# Patient Record
Sex: Female | Born: 1945 | Race: Black or African American | Hispanic: No | Smoking: Former smoker
Health system: Southern US, Community
[De-identification: ages and names within clinical notes are randomized; demographics above are authoritative.]

## PROBLEM LIST (undated history)

## (undated) DIAGNOSIS — C099 Malignant neoplasm of tonsil, unspecified: Secondary | ICD-10-CM

## (undated) DIAGNOSIS — K219 Gastro-esophageal reflux disease without esophagitis: Secondary | ICD-10-CM

## (undated) DIAGNOSIS — J449 Chronic obstructive pulmonary disease, unspecified: Secondary | ICD-10-CM

## (undated) DIAGNOSIS — M109 Gout, unspecified: Secondary | ICD-10-CM

## (undated) DIAGNOSIS — C189 Malignant neoplasm of colon, unspecified: Secondary | ICD-10-CM

## (undated) DIAGNOSIS — Z973 Presence of spectacles and contact lenses: Secondary | ICD-10-CM

## (undated) DIAGNOSIS — C801 Malignant (primary) neoplasm, unspecified: Secondary | ICD-10-CM

## (undated) DIAGNOSIS — H269 Unspecified cataract: Secondary | ICD-10-CM

## (undated) DIAGNOSIS — E7800 Pure hypercholesterolemia, unspecified: Secondary | ICD-10-CM

## (undated) DIAGNOSIS — I10 Essential (primary) hypertension: Secondary | ICD-10-CM

## (undated) DIAGNOSIS — R911 Solitary pulmonary nodule: Secondary | ICD-10-CM

## (undated) DIAGNOSIS — N289 Disorder of kidney and ureter, unspecified: Secondary | ICD-10-CM

## (undated) DIAGNOSIS — K115 Sialolithiasis: Secondary | ICD-10-CM

## (undated) DIAGNOSIS — Z923 Personal history of irradiation: Secondary | ICD-10-CM

## (undated) DIAGNOSIS — J189 Pneumonia, unspecified organism: Secondary | ICD-10-CM

## (undated) DIAGNOSIS — E119 Type 2 diabetes mellitus without complications: Secondary | ICD-10-CM

## (undated) HISTORY — DX: Malignant neoplasm of tonsil, unspecified: C09.9

## (undated) HISTORY — PX: CHOLECYSTECTOMY: SHX55

## (undated) HISTORY — DX: Sialolithiasis: K11.5

## (undated) HISTORY — PX: COLONOSCOPY: SHX174

## (undated) HISTORY — DX: Gastro-esophageal reflux disease without esophagitis: K21.9

## (undated) HISTORY — DX: Pure hypercholesterolemia, unspecified: E78.00

## (undated) HISTORY — DX: Malignant (primary) neoplasm, unspecified: C80.1

## (undated) HISTORY — PX: TONSILLECTOMY: SUR1361

## (undated) HISTORY — PX: BREAST BIOPSY: SHX20

## (undated) HISTORY — DX: Gout, unspecified: M10.9

## (undated) HISTORY — DX: Type 2 diabetes mellitus without complications: E11.9

## (undated) HISTORY — PX: WISDOM TOOTH EXTRACTION: SHX21

---

## 1997-12-18 ENCOUNTER — Ambulatory Visit (HOSPITAL_COMMUNITY): Admission: RE | Admit: 1997-12-18 | Discharge: 1997-12-18 | Payer: Self-pay | Admitting: Internal Medicine

## 2002-05-02 ENCOUNTER — Other Ambulatory Visit: Admission: RE | Admit: 2002-05-02 | Discharge: 2002-05-02 | Payer: Self-pay | Admitting: Obstetrics and Gynecology

## 2003-05-08 ENCOUNTER — Other Ambulatory Visit: Admission: RE | Admit: 2003-05-08 | Discharge: 2003-05-08 | Payer: Self-pay | Admitting: Obstetrics and Gynecology

## 2004-05-12 ENCOUNTER — Other Ambulatory Visit: Admission: RE | Admit: 2004-05-12 | Discharge: 2004-05-12 | Payer: Self-pay | Admitting: Obstetrics and Gynecology

## 2005-05-13 ENCOUNTER — Other Ambulatory Visit: Admission: RE | Admit: 2005-05-13 | Discharge: 2005-05-13 | Payer: Self-pay | Admitting: Obstetrics and Gynecology

## 2006-06-15 ENCOUNTER — Other Ambulatory Visit: Admission: RE | Admit: 2006-06-15 | Discharge: 2006-06-15 | Payer: Self-pay | Admitting: Obstetrics and Gynecology

## 2007-11-15 ENCOUNTER — Other Ambulatory Visit: Admission: RE | Admit: 2007-11-15 | Discharge: 2007-11-15 | Payer: Self-pay | Admitting: Obstetrics and Gynecology

## 2009-08-29 ENCOUNTER — Encounter: Admission: RE | Admit: 2009-08-29 | Discharge: 2009-08-29 | Payer: Self-pay | Admitting: Internal Medicine

## 2010-09-20 ENCOUNTER — Inpatient Hospital Stay (HOSPITAL_COMMUNITY)
Admission: EM | Admit: 2010-09-20 | Discharge: 2010-09-28 | DRG: 640 | Disposition: A | Discharge status: Home Health | Payer: 59 | Attending: Internal Medicine | Admitting: Internal Medicine

## 2010-09-20 ENCOUNTER — Emergency Department (HOSPITAL_COMMUNITY): Payer: 59

## 2010-09-20 DIAGNOSIS — R319 Hematuria, unspecified: Secondary | ICD-10-CM | POA: Diagnosis present

## 2010-09-20 DIAGNOSIS — F17200 Nicotine dependence, unspecified, uncomplicated: Secondary | ICD-10-CM | POA: Diagnosis present

## 2010-09-20 DIAGNOSIS — R7401 Elevation of levels of liver transaminase levels: Secondary | ICD-10-CM | POA: Diagnosis present

## 2010-09-20 DIAGNOSIS — E876 Hypokalemia: Secondary | ICD-10-CM | POA: Diagnosis present

## 2010-09-20 DIAGNOSIS — M25519 Pain in unspecified shoulder: Secondary | ICD-10-CM | POA: Diagnosis not present

## 2010-09-20 DIAGNOSIS — S42209A Unspecified fracture of upper end of unspecified humerus, initial encounter for closed fracture: Secondary | ICD-10-CM | POA: Diagnosis not present

## 2010-09-20 DIAGNOSIS — R7402 Elevation of levels of lactic acid dehydrogenase (LDH): Secondary | ICD-10-CM | POA: Diagnosis present

## 2010-09-20 DIAGNOSIS — D61818 Other pancytopenia: Secondary | ICD-10-CM | POA: Diagnosis present

## 2010-09-20 DIAGNOSIS — N179 Acute kidney failure, unspecified: Secondary | ICD-10-CM | POA: Diagnosis present

## 2010-09-20 DIAGNOSIS — G9341 Metabolic encephalopathy: Secondary | ICD-10-CM | POA: Diagnosis present

## 2010-09-20 DIAGNOSIS — W010XXA Fall on same level from slipping, tripping and stumbling without subsequent striking against object, initial encounter: Secondary | ICD-10-CM | POA: Diagnosis not present

## 2010-09-20 DIAGNOSIS — R197 Diarrhea, unspecified: Secondary | ICD-10-CM | POA: Diagnosis present

## 2010-09-20 DIAGNOSIS — E871 Hypo-osmolality and hyponatremia: Principal | ICD-10-CM | POA: Diagnosis present

## 2010-09-20 DIAGNOSIS — Y9210 Unspecified residential institution as the place of occurrence of the external cause: Secondary | ICD-10-CM | POA: Diagnosis not present

## 2010-09-20 DIAGNOSIS — I10 Essential (primary) hypertension: Secondary | ICD-10-CM | POA: Diagnosis present

## 2010-09-20 DIAGNOSIS — E869 Volume depletion, unspecified: Secondary | ICD-10-CM | POA: Diagnosis present

## 2010-09-20 DIAGNOSIS — F1010 Alcohol abuse, uncomplicated: Secondary | ICD-10-CM | POA: Diagnosis present

## 2010-09-20 DIAGNOSIS — R627 Adult failure to thrive: Secondary | ICD-10-CM | POA: Diagnosis present

## 2010-09-20 DIAGNOSIS — D638 Anemia in other chronic diseases classified elsewhere: Secondary | ICD-10-CM | POA: Diagnosis present

## 2010-09-20 LAB — URINALYSIS, ROUTINE W REFLEX MICROSCOPIC
Ketones, ur: NEGATIVE mg/dL
Leukocytes, UA: NEGATIVE
Urobilinogen, UA: 1 mg/dL (ref 0.0–1.0)

## 2010-09-20 LAB — URINE MICROSCOPIC-ADD ON

## 2010-09-20 LAB — OCCULT BLOOD, POC DEVICE: Fecal Occult Bld: NEGATIVE

## 2010-09-20 LAB — COMPREHENSIVE METABOLIC PANEL
AST: 86 U/L — ABNORMAL HIGH (ref 0–37)
Albumin: 3.8 g/dL (ref 3.5–5.2)
BUN: 35 mg/dL — ABNORMAL HIGH (ref 6–23)
Calcium: 6.6 mg/dL — ABNORMAL LOW (ref 8.4–10.5)
GFR calc Af Amer: 22 mL/min — ABNORMAL LOW (ref >60)
GFR calc non Af Amer: 18 mL/min — ABNORMAL LOW (ref >60)
Glucose, Bld: 83 mg/dL (ref 70–99)

## 2010-09-20 LAB — DIFFERENTIAL
Eosinophils Absolute: 0 10*3/uL (ref 0.0–0.7)
Lymphocytes Relative: 11 % — ABNORMAL LOW (ref 12–46)
Monocytes Absolute: 0.4 10*3/uL (ref 0.1–1.0)
Monocytes Relative: 8 % (ref 3–12)
Neutrophils Relative %: 81 % — ABNORMAL HIGH (ref 43–77)

## 2010-09-20 LAB — BASIC METABOLIC PANEL
CO2: 22 mEq/L (ref 19–32)
GFR calc Af Amer: 18 mL/min — ABNORMAL LOW (ref >60)
Glucose, Bld: 97 mg/dL (ref 70–99)
Potassium: 2.6 mEq/L — CL (ref 3.5–5.1)
Sodium: 113 mEq/L — CL (ref 135–145)

## 2010-09-20 LAB — RETICULOCYTES
Retic Count, Absolute: 6 10*3/uL — ABNORMAL LOW (ref 19.0–186.0)
Retic Ct Pct: 0.2 % — ABNORMAL LOW (ref 0.4–3.1)

## 2010-09-20 LAB — CBC
HCT: 26 % — ABNORMAL LOW (ref 36.0–46.0)
Hemoglobin: 9.9 g/dL — ABNORMAL LOW (ref 12.0–15.0)
MCHC: 38.1 g/dL — ABNORMAL HIGH (ref 30.0–36.0)
Platelets: 106 10*3/uL — ABNORMAL LOW (ref 150–400)

## 2010-09-20 LAB — NA AND K (SODIUM & POTASSIUM), RAND UR: Sodium, Ur: 32 mEq/L

## 2010-09-21 ENCOUNTER — Inpatient Hospital Stay (HOSPITAL_COMMUNITY): Payer: 59

## 2010-09-21 LAB — FOLATE: Folate: 11.9 ng/mL

## 2010-09-21 LAB — DIFFERENTIAL
Basophils Absolute: 0 10*3/uL (ref 0.0–0.1)
Eosinophils Relative: 1 % (ref 0–5)
Lymphocytes Relative: 15 % (ref 12–46)
Lymphs Abs: 0.6 10*3/uL — ABNORMAL LOW (ref 0.7–4.0)
Neutro Abs: 2.7 10*3/uL (ref 1.7–7.7)
Neutrophils Relative %: 72 % (ref 43–77)

## 2010-09-21 LAB — CLOSTRIDIUM DIFFICILE BY PCR: Toxigenic C. Difficile by PCR: NEGATIVE

## 2010-09-21 LAB — CBC
HCT: 22 % — ABNORMAL LOW (ref 36.0–46.0)
Hemoglobin: 8.3 g/dL — ABNORMAL LOW (ref 12.0–15.0)
MCHC: 37.7 g/dL — ABNORMAL HIGH (ref 30.0–36.0)
RBC: 2.6 MIL/uL — ABNORMAL LOW (ref 3.87–5.11)
WBC: 3.7 10*3/uL — ABNORMAL LOW (ref 4.0–10.5)

## 2010-09-21 LAB — MAGNESIUM
Magnesium: 1.1 mg/dL — ABNORMAL LOW (ref 1.5–2.5)
Magnesium: 1.5 mg/dL (ref 1.5–2.5)

## 2010-09-21 LAB — BASIC METABOLIC PANEL
Creatinine, Ser: 2.09 mg/dL — ABNORMAL HIGH (ref 0.4–1.2)
GFR calc non Af Amer: 24 mL/min — ABNORMAL LOW (ref >60)
Glucose, Bld: 119 mg/dL — ABNORMAL HIGH (ref 70–99)

## 2010-09-21 LAB — FERRITIN: Ferritin: 1492 ng/mL — ABNORMAL HIGH (ref 10–291)

## 2010-09-22 LAB — BASIC METABOLIC PANEL
BUN: 22 mg/dL (ref 6–23)
Calcium: 6.5 mg/dL — ABNORMAL LOW (ref 8.4–10.5)
Chloride: 91 mEq/L — ABNORMAL LOW (ref 96–112)
Creatinine, Ser: 1.06 mg/dL (ref 0.4–1.2)

## 2010-09-22 LAB — HEPATIC FUNCTION PANEL
ALT: 42 U/L — ABNORMAL HIGH (ref 0–35)
Alkaline Phosphatase: 95 U/L (ref 39–117)
Bilirubin, Direct: 0.5 mg/dL — ABNORMAL HIGH (ref 0.0–0.3)
Indirect Bilirubin: 0.7 mg/dL (ref 0.3–0.9)
Total Bilirubin: 1.2 mg/dL (ref 0.3–1.2)
Total Protein: 5.7 g/dL — ABNORMAL LOW (ref 6.0–8.3)

## 2010-09-22 LAB — MAGNESIUM: Magnesium: 1 mg/dL — ABNORMAL LOW (ref 1.5–2.5)

## 2010-09-22 LAB — CBC
HCT: 21.5 % — ABNORMAL LOW (ref 36.0–46.0)
MCHC: 37.2 g/dL — ABNORMAL HIGH (ref 30.0–36.0)
Platelets: 101 10*3/uL — ABNORMAL LOW (ref 150–400)
RDW: 13.6 % (ref 11.5–15.5)

## 2010-09-23 LAB — COMPREHENSIVE METABOLIC PANEL
AST: 113 U/L — ABNORMAL HIGH (ref 0–37)
Albumin: 3.3 g/dL — ABNORMAL LOW (ref 3.5–5.2)
Calcium: 6.4 mg/dL — CL (ref 8.4–10.5)
Creatinine, Ser: 0.76 mg/dL (ref 0.4–1.2)
GFR calc Af Amer: >60 mL/min (ref >60)
GFR calc non Af Amer: >60 mL/min (ref >60)

## 2010-09-23 LAB — CBC
MCH: 31.3 pg (ref 26.0–34.0)
MCHC: 35.9 g/dL (ref 30.0–36.0)
Platelets: 110 10*3/uL — ABNORMAL LOW (ref 150–400)

## 2010-09-23 LAB — BASIC METABOLIC PANEL
CO2: 22 mEq/L (ref 19–32)
Calcium: 6.6 mg/dL — ABNORMAL LOW (ref 8.4–10.5)
Creatinine, Ser: 0.78 mg/dL (ref 0.4–1.2)
GFR calc Af Amer: >60 mL/min (ref >60)
Glucose, Bld: 119 mg/dL — ABNORMAL HIGH (ref 70–99)

## 2010-09-23 LAB — PHOSPHORUS: Phosphorus: <0.3 mg/dL — CL (ref 2.3–4.6)

## 2010-09-24 LAB — PROTEIN ELECTROPHORESIS, SERUM
Albumin ELP: 60.8 % (ref 55.8–66.1)
Alpha-1-Globulin: 5.8 % — ABNORMAL HIGH (ref 2.9–4.9)
Alpha-2-Globulin: 11.3 % (ref 7.1–11.8)
Total Protein ELP: 6.4 g/dL (ref 6.0–8.3)

## 2010-09-24 LAB — CBC
Hemoglobin: 7.9 g/dL — ABNORMAL LOW (ref 12.0–15.0)
MCH: 32.2 pg (ref 26.0–34.0)
RBC: 2.45 MIL/uL — ABNORMAL LOW (ref 3.87–5.11)
WBC: 4.2 10*3/uL (ref 4.0–10.5)

## 2010-09-24 LAB — COMPREHENSIVE METABOLIC PANEL
Albumin: 3.4 g/dL — ABNORMAL LOW (ref 3.5–5.2)
BUN: 6 mg/dL (ref 6–23)
Chloride: 93 mEq/L — ABNORMAL LOW (ref 96–112)
Creatinine, Ser: 0.67 mg/dL (ref 0.4–1.2)
GFR calc non Af Amer: >60 mL/min (ref >60)
Glucose, Bld: 139 mg/dL — ABNORMAL HIGH (ref 70–99)
Total Bilirubin: 0.5 mg/dL (ref 0.3–1.2)

## 2010-09-24 LAB — ABO/RH: ABO/RH(D): O POS

## 2010-09-25 ENCOUNTER — Inpatient Hospital Stay (HOSPITAL_COMMUNITY): Payer: 59

## 2010-09-25 LAB — CBC
MCH: 31.5 pg (ref 26.0–34.0)
MCHC: 36.2 g/dL — ABNORMAL HIGH (ref 30.0–36.0)
MCV: 87.1 fL (ref 78.0–100.0)
Platelets: 143 10*3/uL — ABNORMAL LOW (ref 150–400)
RBC: 2.41 MIL/uL — ABNORMAL LOW (ref 3.87–5.11)
RDW: 13.7 % (ref 11.5–15.5)

## 2010-09-25 LAB — OSMOLALITY: Osmolality: 256 mOsm/kg — ABNORMAL LOW (ref 275–300)

## 2010-09-25 LAB — BASIC METABOLIC PANEL
BUN: 8 mg/dL (ref 6–23)
Calcium: 7.7 mg/dL — ABNORMAL LOW (ref 8.4–10.5)
Chloride: 91 mEq/L — ABNORMAL LOW (ref 96–112)
Creatinine, Ser: 0.79 mg/dL (ref 0.4–1.2)
GFR calc Af Amer: >60 mL/min (ref >60)
GFR calc non Af Amer: >60 mL/min (ref >60)

## 2010-09-25 LAB — PHOSPHORUS: Phosphorus: 0.7 mg/dL — CL (ref 2.3–4.6)

## 2010-09-25 LAB — MAGNESIUM: Magnesium: 1 mg/dL — ABNORMAL LOW (ref 1.5–2.5)

## 2010-09-26 LAB — COMPREHENSIVE METABOLIC PANEL
ALT: 29 U/L (ref 0–35)
AST: 40 U/L — ABNORMAL HIGH (ref 0–37)
Albumin: 3.4 g/dL — ABNORMAL LOW (ref 3.5–5.2)
CO2: 22 mEq/L (ref 19–32)
Calcium: 8.6 mg/dL (ref 8.4–10.5)
Chloride: 90 mEq/L — ABNORMAL LOW (ref 96–112)
Creatinine, Ser: 0.7 mg/dL (ref 0.4–1.2)
GFR calc Af Amer: >60 mL/min (ref >60)
GFR calc non Af Amer: >60 mL/min (ref >60)
Sodium: 124 mEq/L — ABNORMAL LOW (ref 135–145)

## 2010-09-26 LAB — UIFE/LIGHT CHAINS/TP QN, 24-HR UR
Alpha 1, Urine: DETECTED — AB
Alpha 2, Urine: DETECTED — AB
Free Kappa Lt Chains,Ur: 6.65 mg/dL — ABNORMAL HIGH (ref 0.04–1.51)
Free Lambda Excretion/Day: 28.93 mg/d
Time: 24 hours
Total Protein, Urine: 8.7 mg/dL
Volume, Urine: 2225 mL

## 2010-09-26 LAB — CBC
Hemoglobin: 7 g/dL — ABNORMAL LOW (ref 12.0–15.0)
MCH: 31.3 pg (ref 26.0–34.0)
MCHC: 36.6 g/dL — ABNORMAL HIGH (ref 30.0–36.0)
RBC: 2.24 MIL/uL — ABNORMAL LOW (ref 3.87–5.11)

## 2010-09-26 LAB — IRON AND TIBC
Iron: 27 ug/dL — ABNORMAL LOW (ref 42–135)
TIBC: 200 ug/dL — ABNORMAL LOW (ref 250–470)
UIBC: 173 ug/dL

## 2010-09-26 LAB — VITAMIN B12: Vitamin B-12: 257 pg/mL (ref 211–911)

## 2010-09-26 LAB — FERRITIN: Ferritin: 689 ng/mL — ABNORMAL HIGH (ref 10–291)

## 2010-09-26 NOTE — Group Therapy Note (Addendum)
Courtney Bowen, Courtney Bowen              ACCOUNT NO.:  [id number]  MEDICAL RECORD NO.:  [id number]           PATIENT TYPE:  I  LOCATION:  [id number]                         FACILITY:  [facility]  PHYSICIAN:  Lottie Dawson, MD       DATE OF BIRTH:  Oct 17, 1945                                PROGRESS NOTE   PRIMARY CARE PROVIDER: Delanna Ahmadi, MD  CURRENT DIAGNOSES: 1. Hyponatremia secondary to decreased volume aggravated by     hypokalemia. 2. Metabolic encephalopathy secondary to hyponatremia. 3. Hypokalemia. 4. Acute renal failure. 5. Diarrhea. 6. Elevated transaminase. 7. Hypocalcemia. 8. Hypomagnesemia. 9. Hypophosphatemia. 10.Vitamin D deficiency. 11.Pancytopenia. 12.Right humeral head and neck fracture, status post fall. 13.History of EtOH abuse.  MEDICATIONS: To be dictated at discharge.  DIAGNOSTIC LABORATORY DATA: Sodium 113, potassium 2.6, chloride 70, CO2 of 22, BUN 37, creatinine 3.17, glucose 97, calcium 6.6.  WBCs 4.4, hemoglobin 9.9, hematocrit 26.0, platelets 106, neutrophils 81%, absolute neutrophils 3.5, reticulocyte percent 0.2, RBCs 2.98, absolute reticulocytes 6.0.  PT 13.6, INR 1.02, PTT 34, magnesium 0.8.  Serum osmolality 251.  TSH 1.63. Vitamin B12 of 397.  Folic acid [id number].  Ferritin 14.92.  FOBT is negative.  Urine, 30 protein, 100 glucose, rare bacteria, granular casts.  C difficile by PCR is negative.  HIV antibody is nonreactive. Phosphorus is 0.3. Hepatic function panel, total bilirubin 1.2, direct bilirubin 0.5, indirect 0.7, alkaline phosphatase 95, AST 102, ALT 42, total protein 5.7, albumin 3.4.  Ionized calcium 0.88.  Parathyroid hormone 311.4, calcium 6.9.  DIAGNOSTIC RADIOLOGY: Chest x-ray on May 26th shows minimal changes of COPD, no acute abnormality.  Ultrasound of the abdomen on May 27th shows status post cholecystectomy. Common bile duct measures 9 mm.  X-ray of the right shoulder on May 31st shows a comminuted fracture through the  surgical neck of the right humerus with fracture line involving the lateral aspect of the right humeral head.  Underlying well circumcised lucency is suggested, a pathological fracture cannot be entirely excluded.  X-ray of her right humerus, comminuted mildly displaced fracture through the right humeral head and neck.  CONSULTATION: Pietro Cassis. Alvan Dame, MD  PROCEDURES: None at the time of this dictation.  BRIEF HISTORY: Courtney Bowen is a 65 year old female who presented to the [place] ED on May 26th with a 2-day history of worsening weakness, anorexia, and confusion.  She reported that for the past 2 weeks she had extremely poor appetite and has consumed no solid foods.  She denied any nausea, vomiting, or diarrhea.  However, when she got to the emergency room, she had frequent loose stools.  She also reported an approximate 20-pound weight loss over the past 2 or 3 months, most in the past 2 weeks.  She indicated her weakness progressed to the point she do not feel she could ambulate to the car to get to the emergency room, so she called EMS. Emergency room evaluation revealed severe hyponatremia with a serum sodium of 113, potassium was depressed at 2.6, BUN 37, creatinine 3.17 respectively.  The hospitalist was asked to admit for further evaluation and treatment.  HOSPITAL COURSE BY  PROBLEM: 1. Hyponatremia, probably secondary to volume depletion and alcohol     use.  The patient was given IV fluids and placed on fluid     restriction.  Her sodium level came up to 126 from 113.     Currently the patient has no IV fluids running and her sodium     continues to improve slowly.  2. Metabolic encephalopathy, probably secondary to #1, currently     resolved at the time of this dictation. 3. Hypokalemia.  Potassium has been repleted and hypokalemia is     resolved. 4. Acute renal failure, likely prerenal secondary to dehydration.  The     patient was given IV fluids, at the time  of this dictation acute     renal failure is resolved. 5. Elevated transaminase, probably secondary to EtOH use.  At the time     of this dictation, transaminase is improving.  AST currently 76,     ALT currently 41. 6. Hypocalcemia.  The patient has received vitamin D and calcium,     calcium level remains at 7.7 up from 6.4. 7. Hypomagnesemia.  Magnesium and has been repleted.  On two     occasions, magnesium remained low at 1.0 up from 0.8.     Likely due to malnutritions and alcohol use. 8. Hypophosphatemia has been repleted x2.  At the time of this     dictation, phosphorus is low at 0.7 up from less than 0.3. 9. Vitamin D deficiency.  The patient has been given vitamin D. 10.History of EtOH.  Upon admission and initial interview, the patient     admitted to a remote history of EtOH abuse.  In the course of the     week, the patient has admitted to being a current drinker and     having at least 4 glasses of rum per day.  The patient admits to     not eating.  She met with the social worker and verbalized an     interest in detoxing and alcohol anonymous.  The patient was     started on the CIWA protocol on May 30th as she was starting to     exhibit signs of withdrawal, specifically irritability, agitation,     some mild tremors. 11.Right humeral head fracture.  Last night, the patient got up to go     to the bathroom and tripped and fell, fracturing her right humerus     head.  Orthopedics has been asked to consult.  At the time of this     dictation, we are awaiting that consultation. 12.Physical exam is documented in chart on rounding note dated, Sep 25, 2010.  DISPOSITION: There is a question of when the patient is medically stable of her being discharged to home versus an inpatient detox.  Her sister from [place] is in town for a couple of weeks.  I updated her today.  We do have to wait and see what the treatment for her fracture will be and what that will entail.   Once that is resolved, we will have a better idea of whether the patient should be discharged to home or to [facility].  Either way the patient will need close followup with her primary care provider.   Addendum 09/26/2010: Patient was evaluated by Dr. Alvan Dame for right humeral head fracture. Recommended conservative management with sling and ice. He will re-evaluate the patient in 1-2 weeks in his clinic.  Patient's hemoglobin has been steadly declining and was given 1 unit of pRBC of 09/26/2010. Dr. Watt Climes from GI was consulted for anemia because though the patient's stool guiac was negative x2, patient's sister expressed concern that blood was found on cloths at home. Patient's urine IFE show no monoclonal free light chains (Bence Jones Protein) are detected. Urine IFE shows polyclonal increase in free Kappa and/or free Lambda light chains, significance is unclear.   Radene Gunning, NP   ______________________________ Lottie Dawson, MD    KMB/MEDQ  D:  09/25/2010  T:  09/26/2010  Job:  [id number]  Electronically Signed by Alveta Heimlich REDDY  on 09/26/2010 05:37:59 PM Electronically Signed by Dyanne Carrel  on 10/25/2010 11:01:38 AM

## 2010-09-27 LAB — CBC
MCH: 31.2 pg (ref 26.0–34.0)
MCHC: 36.8 g/dL — ABNORMAL HIGH (ref 30.0–36.0)
MCV: 84.7 fL (ref 78.0–100.0)
Platelets: 204 10*3/uL (ref 150–400)
RDW: 14.2 % (ref 11.5–15.5)

## 2010-09-27 LAB — BASIC METABOLIC PANEL
BUN: 9 mg/dL (ref 6–23)
Calcium: 9.1 mg/dL (ref 8.4–10.5)
Creatinine, Ser: 0.67 mg/dL (ref 0.4–1.2)
GFR calc Af Amer: >60 mL/min (ref >60)
GFR calc non Af Amer: >60 mL/min (ref >60)

## 2010-09-27 LAB — MAGNESIUM: Magnesium: 1.6 mg/dL (ref 1.5–2.5)

## 2010-09-27 LAB — PHOSPHORUS: Phosphorus: 3.2 mg/dL (ref 2.3–4.6)

## 2010-09-28 LAB — CBC
HCT: 17.8 % — ABNORMAL LOW (ref 36.0–46.0)
MCHC: 36.5 g/dL — ABNORMAL HIGH (ref 30.0–36.0)
Platelets: 185 10*3/uL (ref 150–400)
Platelets: 239 10*3/uL (ref 150–400)
RBC: 2.84 MIL/uL — ABNORMAL LOW (ref 3.87–5.11)
RDW: 14 % (ref 11.5–15.5)
RDW: 14.1 % (ref 11.5–15.5)
WBC: 6.6 10*3/uL (ref 4.0–10.5)

## 2010-09-28 LAB — TYPE AND SCREEN
ABO/RH(D): O POS
Antibody Screen: NEGATIVE
Unit division: 0

## 2010-09-28 LAB — BASIC METABOLIC PANEL
BUN: 12 mg/dL (ref 6–23)
Calcium: 9.1 mg/dL (ref 8.4–10.5)
Creatinine, Ser: 0.66 mg/dL (ref 0.4–1.2)
GFR calc non Af Amer: >60 mL/min (ref >60)
Glucose, Bld: 133 mg/dL — ABNORMAL HIGH (ref 70–99)
Potassium: 4.4 mEq/L (ref 3.5–5.1)

## 2010-09-28 NOTE — Consult Note (Signed)
Courtney Bowen, Courtney Bowen              ACCOUNT NO.:  [id number]  MEDICAL RECORD NO.:  [id number]           PATIENT TYPE:  I  LOCATION:  [id number]                         [facility]ACILITY:  [facility]  PHYSICIAN:  Pietro Cassis. Alvan Dame, M.D.  DATE OF BIRTH:  1945-07-30  DATE OF CONSULTATION:  09/25/2010 DATE OF DISCHARGE:                                CONSULTATION   CHIEF COMPLAINT:  Right shoulder injury.  HISTORY OF PRESENT ILLNESS:  Courtney Bowen is a 65 year old female admitted to the hospital on 09/20/2010 with a diagnoses of hyponatremia and anorexia.  Unfortunately, she was last night up in her rest room and she tripped, slipped, and fell injuring her right upper extremity.  Radiographs were ordered early in the a.m.  We were consulted in the morning with findings.  She has no other injuries to report.  No head injuries.  She has pain predominantly in the right shoulder.  She had been placed in a sling based on the recommendation made earlier.  She has some complaints about the sling as well as the fact she felt like her arm was flapping around slightly in the sling.  She had no lower extremity injuries to complain about over the left upper extremity.  PAST MEDICAL HISTORY:  Hypertension, reflux disease, history of tobacco abuse, remote total cystectomy.  FAMILY HISTORY:  Osteoarthritis, irritable bowel syndrome, and hypothyroidism.  Father with type 2 diabetes.  CURRENT MEDICATIONS:  Prilosec and Micardis.  ALLERGIES:  Drug allergies, none known.  REVIEW OF SYSTEMS:  At this time, a full review of system on admission is negative for new recent chills, fever, night sweats.  She has some progressive loss of weight recently, mostly likely due to anorexia.  No blurry vision or slurred speech.  No chest pain, cough or hemoptysis. No wheezing on exertion. No nausea, vomiting.  No frequency of urination.  PHYSICAL EXAMINATION:  GENERAL:  She is seen and evaluated in the hospital  bed. EXTREMITIES:  She had a sling in the right upper extremity.  She said the sling was too big, and felt that the arm was loose and then it was coming out and flapping at her side.  However, she is lying static in the bed at this point.  She is neurovascularly intact.  She has bruises and tenderness to palpation about the right shoulder.  RADIOLOGIC STUDIES:  Radiographs to the right upper extremity reveal a relatively nondisplaced right humeral head/neck fracture which is essentially 2-part fracture.  No evidence of any bowel injury.  ASSESSMENT: 1. Nondisplaced or minimally displaced proximal humerus fracture. 2. It can be treated at this point without an operation.     Recommend a sling and at this point a shoulder immobilizer to help     to try to provide some more support, however, when she is moving.  Otherwise ice, pain medications, and she should follow up with Dr. Paralee Cancel at [facility] at office number [phone] in 1 or 2 week period of time for radiographs or if there are any complicating features, questions, or concerns.     Pietro Cassis Alvan Dame, M.D.  MDO/MEDQ  D:  09/25/2010  T:  09/26/2010  Job:  [id number]  Electronically Signed by Paralee Cancel M.D. on 09/28/2010 04:27:55 PM

## 2010-10-01 NOTE — H&P (Signed)
Courtney Bowen, Courtney Bowen              ACCOUNT NO.:  [id number]  MEDICAL RECORD NO.:  [id number]           PATIENT TYPE:  I  LOCATION:  [id number]                         FACILITY:  [facility]  PHYSICIAN:  Marletta Lor, MDDATE OF BIRTH:  04-07-1946  DATE OF ADMISSION:  09/20/2010 DATE OF DISCHARGE:                             HISTORY & PHYSICAL   CHIEF COMPLAINT:  Anorexia and progressive weakness.  HISTORY OF PRESENT ILLNESS:  The patient is a 65 year old African- American female who presented to the ED with a 2-day history of worsening anorexia and progressive weakness.  She states that for the past 2 weeks she has had an extremely poor appetite but has been consuming liquids.  She denies any nausea, vomiting or diarrhea.  Since arrival in the ED, however, she has had frequent loose stools.  She feels there has been an approximate 20-pound weight loss over the past 2 or 3 months, most in the past 2 weeks.  Her weakness has progressed to the point where she did not feel she could ambulate to the car for transport to the hospital and came via EMS.  ED evaluation revealed severe hyponatremia with a serum sodium of 113, potassium was depressed at 2.6, BUN and creatinine 37 and 3.17 respectively.  The patient is also noted to have moderate pancytopenia.  The patient is now admitted for further evaluation and treatment of her hyponatremia as well as pancytopenia.  PAST MEDICAL HISTORY:  The patient has a long history of treated hypertension.  She has been on Micardis, unclear dose, but apparently not diuretic therapy.  The patient has gastroesophageal flux disease and a history of ongoing tobacco use.  She smokes in excess of 1 pack per day.  She states that she has not seen her primary care provider in a number of years.  She does state that she had a colonoscopy performed by Dr. Sammuel Cooper approximately 2 years ago before he retired.  She has had remote cholecystectomy, otherwise denies  any significant past medical history.  FAMILY HISTORY:  Mother is 66 with osteoarthritis, irritable bowel syndrome and a history of hypothyroidism.  Father died at 62.  One brother and one sister.  Brother has type 2 diabetes.  Sister with hypothyroidism.  Medical regimen includes Prilosec 20 mg daily, Micardis unclear dose.  DRUG ALLERGIES:  None.  REVIEW OF SYSTEMS:  Denies any fever, chills.  Positive for progressive weakness, anorexia, and a weight loss of 20 pounds.  HEENT: Negative for visual disturbances, hoarseness, swallowing difficulty.  PULMONARY: One- pack per day smoker.  Denies any known pulmonary disease.  Denies any productive cough or hemoptysis.  CARDIAC: Denies any melena, coronary artery disease, exertional chest pain.  GI:  Positive for anorexia and weight loss.  Positive diarrhea today.  She has a history of gastroesophageal flux disease and has been on chronic PPI therapy.  Did have a colonoscopy approximately 2 years ago.  ENDOCRINE:  Positive family history for diabetes and hypothyroidism.  No personal history. NEUROPSYCH:  Unremarkable.  SOCIAL HISTORY:  The patient is divorced.  No children.  One-pack per day smoker.  PHYSICAL EXAMINATION:  VITAL SIGNS:  Temperature 97.6, pulse 76, respiratory rate 15, blood pressure 100/68. GENERAL:  Exam revealed a well-developed, alert female who is quite weak but in no acute distress.  She did appear to be chronically ill with some right temporal wasting. HEENT EXAM:  Revealed normal pupil responses.  Conjunctivae clear.  Ear, nose and throat unremarkable.  Mucous membranes appeared well hydrated. NECK:  No bruits, adenopathy or thyroid enlargement. CHEST:  Clear. BREASTS:  Breast exam negative. CARDIAC EXAM:  Normal S1, S2.  No tachycardia.  No murmurs or gallops. ABDOMEN:  Soft and nontender.  There is suggestion of liver enlargement but no splenomegaly.  Bowel sounds were active.  EXTREMITIES:  Revealed no  edema.  Posterior tibial pulses were faint.  Dorsalis pedis pulses were full.  SKIN:  Warm and dry without rash.  There is some coolness to the skin peripherally involving the hands and feet.  Neurologic: Nonfocal.  She was weak but could move all extremities without difficulty.  Laboratory studies included a chest x-ray which revealed mild COPD but no active disease.  Urinalysis revealed specific gravity of 1.014 and mild hematuria.  Stool was negative for occult blood.  CBC revealed a white count 4.4 and total platelet count of 106,000, hemoglobin 9.9, hematocrit 26%, MCV was normal.  Chemistries revealed a serum sodium of 113, potassium 2.6, chloride 70, CO2 content 22, BUN 37, creatinine 3.17.  IMPRESSION: 1. Hyponatremia.  This is almost certain related to volume depletion,     aggravated by her hypokalemia.  She has been quite anorexic and     taking IV fluids and little solid foods.  Decrease in solid intake     also a major factor.  Her calculated serum osmolality is 244.  Will     continue volume replacement with IV normal saline and follow     electrolytes closely.  Will also replete potassium. 2. Pancytopenia.  We will check some anemia indices and follow up in     the morning. 3. Rule out hepatomegaly.  We will obtain abdominal ultrasound to     further evaluate and to exclude splenomegaly. 4. Additional diagnosis of hematuria.  Will follow up a urinalysis.     Marletta Lor, MD     PFK/MEDQ  D:  09/20/2010  T:  09/20/2010  Job:  [id number]  Electronically Signed by Bluford Kaufmann MD on 10/01/2010 09:39:36 PM

## 2010-10-02 NOTE — Discharge Summary (Addendum)
  NAMESIRI, Courtney Bowen              ACCOUNT NO.:  [id number]  MEDICAL RECORD NO.:  [id number]           PATIENT TYPE:  I  LOCATION:  [id number]                         FACILITY:  [facility]  PHYSICIAN:  Delanna Ahmadi, M.D.  DATE OF BIRTH:  1945/06/01  DATE OF ADMISSION:  09/20/2010 DATE OF DISCHARGE:                              DISCHARGE SUMMARY   ADDENDUM: On September 26, 2010, the patient was seen by Dr. Watt Climes of GI.  He felt the patient's anemia was likely a chronic disease.  The patient had been Hemoccult negative x3 and a GI workup was not recommended as an inpatient.  Consider EGD as an outpatient.  The patient did have a sodium 126 on September 27, 2010; was given one more day of normal saline with sodium coming up to 127.  Magnesium and potassium levels normalized. Ativan was discontinued.  The patient was seen by Physical Therapy and Occupational Therapy.  Physical Therapy felt the patient was ambulatory with a straight cane but continued to be unsteady.  Home physical therapy and straight cane was recommended.  Occupational Therapy also saw the patient and recommended home occupational therapy.  On the morning of possible discharge, the patient's hemoglobin was 6.5.  This was unexpected as the patient had had a hemoglobin of 10.2 documented on September 27, 2010, after blood transfusion.  The patient had shown no sign of GI bleeding such as melena or bright red blood per rectum.  A follow-up hemoglobin is pending at the time of this dictation.  Assuming that the initial hemoglobin was an incorrect reading and the patient's hemoglobin is closer to 9 or 10, she will be discharged.  DISCHARGE MEDICATIONS: 1. Folic acid 1 mg one p.o. daily for 2 weeks. 2. Magnesium oxide 400 mg 1 tablet b.i.d. for 2 weeks. 3. Multivitamin 1 p.o. daily. 4. Potassium chloride 20 mEq one a day for 2 weeks. 5. Thiamine 100 mg 1 a day for 2 weeks. 6. Os-Cal 500 mg one twice a day. 7. Vitamin D 1000 units once a  day. 8. Omeprazole 20 mg a day.  [name] Micardis HCT was discharged during the hospitalization.  DISPOSITION:  Discharged to home.  Follow up in 1 week with Dr. Laurann Montana, in 2 weeks with Dr. Alvan Dame of Orthopedics.  CODE STATUS:  Full code.  DIET:  Regular diet.  ACTIVITY:  As tolerated and per Physical Therapy and Occupational Therapy which was arranged at home.          ______________________________ Delanna Ahmadi, M.D.     JJG/MEDQ  D:  09/28/2010  T:  09/28/2010  Job:  [id number]  cc:   Pietro Cassis Alvan Dame, M.D. [id number]  Electronically Signed by Lavone Orn M.D. on 10/02/2010 06:13:27 PM Electronically Signed by Lavone Orn M.D. on 10/28/2010 05:57:23 PM

## 2010-11-08 NOTE — Consult Note (Signed)
Courtney Bowen, Courtney Bowen              ACCOUNT NO.:  [id number]  MEDICAL RECORD NO.:  [id number]           PATIENT TYPE:  I  LOCATION:  [id number]                         FACILITY:  [facility]  PHYSICIAN:  Jeryl Columbia, M.D.    DATE OF BIRTH:  1946/04/23  DATE OF CONSULTATION:  09/26/2010 DATE OF DISCHARGE:                                CONSULTATION   HISTORY:  The patient is seen at the request of the hospitalist for anemia.  Iron studies implied due to chronic disease.  She has had multiple guaiacs during this hospitalization, all have been negative. Currently, she is doing better from her hyponatremia and encephalopathy and alcohol withdrawal and she has no GI symptoms, has not seen any blood or black stools at home.  Her workup in the past has included a normal colonoscopy by Dr. Sammuel Cooper in 2008 and supposedly she had an endoscopy by Dr. Scarlette Shorts about 8 years ago.  Currently, I do not have that report.  PAST MEDICAL HISTORY:  Her past medical history is pertinent for high blood pressure, history of reflux, and the recent admission with acute renal failure, hyponatremia and humeral head and neck fracture status post fall as well as pancytopenia.  FAMILY HISTORY:  Pertinent for irritable bowel syndrome, but no significant GI problems in the family.  SOCIAL HISTORY:  Does drink as above and does smoke.  ALLERGIES:  None.  MEDICATIONS:  Medicines at home included Prilosec and probably Micardis.  Current in the hospital.  She is on Protonix, magnesium oxide, potassium, folic acid, vitamin D, calcium, thiamine, Ativan, Ensure, Ventolin, antacids, Senokot.  REVIEW OF SYSTEMS:  Negative except above and specifically no GI symptoms; currently and recently at home.  PHYSICAL EXAMINATION:  GENERAL:  No acute distress, sitting comfortably in the chair. ABDOMEN:  Soft, nontender.  LABORATORY DATA:  Pertinent for an anemia panel with low iron, low TIBC, low percent sat and high  ferritin.  CBC today includes hemoglobin of 7 with an MCV of 85, white count 6.3, platelet count has been on the low side 143 yesterday.  BUN of 10, creatinine 0.7.  Sodium still low at 127.  SGOT is 40, alk phos normal.  SGPT normal.  Albumin 3.4. Ultrasound of the abdomen shows her cholecystectomy, not mentioned above, CBD 9 mm but otherwise normal.  Liver is normal.  Spleen is 7.7 cm.  ASSESSMENT: 1. Multiple medical problems and issues. 2. Anemia probably of chronic disease.  PLAN:  With 3 guaiac negatives and no GI history, hold GI workup for now.  We will try to get Dr. Blanch Media endoscopy record from 8 or 9 years ago to bring her chart up-to-date; otherwise, have to see her back as an outpatient and decide if any further workup needs to be done like possibly an endoscopy first and certainly call us if she needs more help during this hospital stay.          ______________________________ Jeryl Columbia, M.D.     MEM/MEDQ  D:  09/26/2010  T:  09/26/2010  Job:  [id number]  cc:   Delanna Ahmadi, M.D. Fax: [phone]  Electronically Signed by Clarene Essex M.D. on 11/08/2010 03:00:40 PM

## 2010-11-28 ENCOUNTER — Ambulatory Visit (HOSPITAL_COMMUNITY): Payer: 59 | Attending: Internal Medicine

## 2010-12-03 ENCOUNTER — Encounter (HOSPITAL_COMMUNITY): Payer: 59 | Attending: Internal Medicine

## 2010-12-03 ENCOUNTER — Other Ambulatory Visit: Payer: Self-pay | Admitting: Internal Medicine

## 2010-12-03 DIAGNOSIS — N19 Unspecified kidney failure: Secondary | ICD-10-CM | POA: Insufficient documentation

## 2010-12-03 DIAGNOSIS — Z1389 Encounter for screening for other disorder: Secondary | ICD-10-CM | POA: Insufficient documentation

## 2010-12-03 DIAGNOSIS — E871 Hypo-osmolality and hyponatremia: Secondary | ICD-10-CM | POA: Insufficient documentation

## 2010-12-03 DIAGNOSIS — D638 Anemia in other chronic diseases classified elsewhere: Secondary | ICD-10-CM | POA: Insufficient documentation

## 2010-12-05 LAB — ACTH STIMULATION, 3 TIME POINTS
Cortisol, 30 Min: 20.7 ug/dL (ref >20)
Cortisol, 60 Min: 23.5 ug/dL (ref >20)
Cortisol, Base: 5.9 ug/dL

## 2011-01-21 ENCOUNTER — Other Ambulatory Visit: Payer: Self-pay | Admitting: Rheumatology

## 2011-01-21 ENCOUNTER — Ambulatory Visit
Admission: RE | Admit: 2011-01-21 | Discharge: 2011-01-21 | Disposition: A | Discharge status: Home / Self Care | Payer: 59 | Source: Ambulatory Visit | Attending: Rheumatology | Admitting: Rheumatology

## 2011-01-21 DIAGNOSIS — M1990 Unspecified osteoarthritis, unspecified site: Secondary | ICD-10-CM

## 2012-02-02 DIAGNOSIS — Z23 Encounter for immunization: Secondary | ICD-10-CM | POA: Diagnosis not present

## 2012-03-30 DIAGNOSIS — Z1231 Encounter for screening mammogram for malignant neoplasm of breast: Secondary | ICD-10-CM | POA: Diagnosis not present

## 2012-05-30 DIAGNOSIS — E7800 Pure hypercholesterolemia, unspecified: Secondary | ICD-10-CM | POA: Diagnosis not present

## 2012-05-30 DIAGNOSIS — Z79899 Other long term (current) drug therapy: Secondary | ICD-10-CM | POA: Diagnosis not present

## 2012-05-30 DIAGNOSIS — F17200 Nicotine dependence, unspecified, uncomplicated: Secondary | ICD-10-CM | POA: Diagnosis not present

## 2012-05-30 DIAGNOSIS — K219 Gastro-esophageal reflux disease without esophagitis: Secondary | ICD-10-CM | POA: Diagnosis not present

## 2012-05-30 DIAGNOSIS — E119 Type 2 diabetes mellitus without complications: Secondary | ICD-10-CM | POA: Diagnosis not present

## 2012-06-23 DIAGNOSIS — D3701 Neoplasm of uncertain behavior of lip: Secondary | ICD-10-CM | POA: Diagnosis not present

## 2012-06-23 DIAGNOSIS — C099 Malignant neoplasm of tonsil, unspecified: Secondary | ICD-10-CM | POA: Diagnosis not present

## 2012-06-23 DIAGNOSIS — K115 Sialolithiasis: Secondary | ICD-10-CM | POA: Diagnosis not present

## 2012-06-23 DIAGNOSIS — F17200 Nicotine dependence, unspecified, uncomplicated: Secondary | ICD-10-CM | POA: Diagnosis not present

## 2012-06-29 ENCOUNTER — Other Ambulatory Visit: Payer: Self-pay | Admitting: Otolaryngology

## 2012-06-29 DIAGNOSIS — C090 Malignant neoplasm of tonsillar fossa: Secondary | ICD-10-CM

## 2012-06-30 ENCOUNTER — Ambulatory Visit
Admission: RE | Admit: 2012-06-30 | Discharge: 2012-06-30 | Disposition: A | Discharge status: Home / Self Care | Payer: Medicare Other | Source: Ambulatory Visit | Attending: Otolaryngology | Admitting: Otolaryngology

## 2012-06-30 DIAGNOSIS — C090 Malignant neoplasm of tonsillar fossa: Secondary | ICD-10-CM

## 2012-06-30 DIAGNOSIS — C099 Malignant neoplasm of tonsil, unspecified: Secondary | ICD-10-CM | POA: Diagnosis not present

## 2012-06-30 MED ORDER — IOHEXOL 300 MG/ML  SOLN
75.0000 mL | Freq: Once | INTRAMUSCULAR | Status: AC | PRN
  Administered 2012-06-30: 75 mL via INTRAVENOUS

## 2012-07-05 DIAGNOSIS — C090 Malignant neoplasm of tonsillar fossa: Secondary | ICD-10-CM | POA: Diagnosis not present

## 2012-07-08 DIAGNOSIS — C099 Malignant neoplasm of tonsil, unspecified: Secondary | ICD-10-CM

## 2012-07-08 DIAGNOSIS — C0689 Malignant neoplasm of overlapping sites of other parts of mouth: Secondary | ICD-10-CM | POA: Diagnosis not present

## 2012-07-08 DIAGNOSIS — C801 Malignant (primary) neoplasm, unspecified: Secondary | ICD-10-CM | POA: Insufficient documentation

## 2012-07-08 HISTORY — DX: Malignant neoplasm of tonsil, unspecified: C09.9

## 2012-07-08 HISTORY — DX: Malignant (primary) neoplasm, unspecified: C80.1

## 2012-07-14 ENCOUNTER — Other Ambulatory Visit (HOSPITAL_COMMUNITY): Payer: Self-pay | Admitting: Otolaryngology

## 2012-07-14 DIAGNOSIS — C099 Malignant neoplasm of tonsil, unspecified: Secondary | ICD-10-CM

## 2012-07-20 ENCOUNTER — Encounter (HOSPITAL_COMMUNITY)
Admission: RE | Admit: 2012-07-20 | Discharge: 2012-07-20 | Disposition: A | Discharge status: Home / Self Care | Payer: Medicare Other | Source: Ambulatory Visit | Attending: Otolaryngology | Admitting: Otolaryngology

## 2012-07-20 DIAGNOSIS — R911 Solitary pulmonary nodule: Secondary | ICD-10-CM | POA: Insufficient documentation

## 2012-07-20 DIAGNOSIS — C099 Malignant neoplasm of tonsil, unspecified: Secondary | ICD-10-CM | POA: Diagnosis not present

## 2012-07-20 MED ORDER — FLUDEOXYGLUCOSE F - 18 (FDG) INJECTION
18.6000 | Freq: Once | INTRAVENOUS | Status: AC | PRN
  Administered 2012-07-20: 18.6 via INTRAVENOUS

## 2012-08-02 ENCOUNTER — Encounter: Payer: Self-pay | Admitting: *Deleted

## 2012-08-02 DIAGNOSIS — K115 Sialolithiasis: Secondary | ICD-10-CM | POA: Insufficient documentation

## 2012-08-02 DIAGNOSIS — C099 Malignant neoplasm of tonsil, unspecified: Secondary | ICD-10-CM | POA: Insufficient documentation

## 2012-08-03 ENCOUNTER — Ambulatory Visit
Admission: RE | Admit: 2012-08-03 | Discharge: 2012-08-03 | Disposition: A | Discharge status: Home / Self Care | Payer: Medicare Other | Source: Ambulatory Visit | Attending: Radiation Oncology | Admitting: Radiation Oncology

## 2012-08-03 ENCOUNTER — Encounter: Payer: Self-pay | Admitting: Radiation Oncology

## 2012-08-03 VITALS — BP 145/83 | HR 86 | Temp 98.7°F | Resp 20 | Ht 65.5 in | Wt 139.4 lb

## 2012-08-03 DIAGNOSIS — C099 Malignant neoplasm of tonsil, unspecified: Secondary | ICD-10-CM | POA: Insufficient documentation

## 2012-08-03 DIAGNOSIS — E7800 Pure hypercholesterolemia, unspecified: Secondary | ICD-10-CM | POA: Insufficient documentation

## 2012-08-03 DIAGNOSIS — E119 Type 2 diabetes mellitus without complications: Secondary | ICD-10-CM | POA: Insufficient documentation

## 2012-08-03 DIAGNOSIS — I10 Essential (primary) hypertension: Secondary | ICD-10-CM | POA: Insufficient documentation

## 2012-08-03 DIAGNOSIS — K219 Gastro-esophageal reflux disease without esophagitis: Secondary | ICD-10-CM | POA: Insufficient documentation

## 2012-08-03 DIAGNOSIS — N289 Disorder of kidney and ureter, unspecified: Secondary | ICD-10-CM | POA: Diagnosis not present

## 2012-08-03 DIAGNOSIS — Z79899 Other long term (current) drug therapy: Secondary | ICD-10-CM | POA: Diagnosis not present

## 2012-08-03 HISTORY — DX: Essential (primary) hypertension: I10

## 2012-08-03 HISTORY — DX: Disorder of kidney and ureter, unspecified: N28.9

## 2012-08-03 NOTE — Progress Notes (Addendum)
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 No   Social Work?   Dentistry?   Swallowing therapy?   Nutrition?   Med/Onc?   PEG placement?   SAFETY ISSUES:  Prior radiation? Possibly at age 67 for "rash" on her stomach  Pacemaker/ICD? no  Possible current pregnancy? no  Is the patient on methotrexate? 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  Pt requests to be seen by SW today if SW available. Spoke w/A Dalene Seltzer, SW who stated either she or L Mullis SW will see pt today.

## 2012-08-03 NOTE — Progress Notes (Signed)
Please see the Nurse Progress Note in the MD Initial Consult Encounter for this patient. 

## 2012-08-04 ENCOUNTER — Ambulatory Visit
Admission: RE | Admit: 2012-08-04 | Discharge: 2012-08-04 | Disposition: A | Discharge status: Home / Self Care | Payer: Medicare Other | Source: Ambulatory Visit | Attending: Radiation Oncology | Admitting: Radiation Oncology

## 2012-08-04 ENCOUNTER — Telehealth: Payer: Self-pay | Admitting: *Deleted

## 2012-08-04 ENCOUNTER — Other Ambulatory Visit: Payer: Self-pay | Admitting: Radiology

## 2012-08-04 DIAGNOSIS — C099 Malignant neoplasm of tonsil, unspecified: Secondary | ICD-10-CM | POA: Insufficient documentation

## 2012-08-04 NOTE — Progress Notes (Signed)
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  HISTORY OF PRESENT ILLNESS::Courtney Bowen is a 67 y.o. female  who presented about 2 months ago with symptoms of sore throat. She was seen by Dr. Redmond Baseman who saw a right tonsillar abnormality. He performed a biopsy which showed a invasive squamous cell carcinoma this was HPV P. 16 negative. She was referred to Dr. Nicolette Bang who ordered a CT of the neck which was performed on 06/30/2012. This showed asymmetry in the right oropharynx measuring up to 2 cm. A subcentimeter right level II AM. Lymph nodes the largest being 6 mm were noted. A 7 mm right apical lung nodule was also noted. A followup PET scan was performed on 326 which showed a tonsillar mass measuring 1.6 x 1.5 cm. A single hypermetabolic right level II lymph node was noted measuring 9 mm with an SUV of 3. This was concerning for metastatic versus reactive. Right upper lobe nodule was not hypermetabolic. The SUV of the primary lesion was 26. She discussed surgical options with Dr. Nicolette Bang and was sent to me to discuss definitive radiation as an alternative. She reports no difficulties in opening her mouth. She does have a history of a stone in her right submandibular gland. She has been reading a lot about preparing for treatment. She has seen her dentist and had a fall cleaning as well as fillings repaired. He did not recommend any teeth be pulled. She is also stocked up on liquid supplements. She is very determined to do this without any assistance as she lives alone and has no family nearby. She reports no real weight loss. 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    PAST SURGICAL HISTORY: Past Surgical History  Procedure Laterality Date  . Breast biopsy      hx of  . Cholecystectomy  20 yrs ago    laproscopic    FAMILY HISTORY: @[facility] @  SOCIAL HISTORY:  History  Substance Use Topics  . Smoking status: Former Smoker -- 1.50 packs/day for 45 years    Types: Cigarettes    Quit date: 07/07/2012  . Smokeless tobacco: Not on file     Comment: 1-1.5 ppd  . Alcohol Use: No     Comment: socially yrs ago    ALLERGIES: Review of patient's allergies indicates no known allergies.  MEDICATIONS:  Current Outpatient Prescriptions  Medication Sig Dispense Refill  . allopurinol (ZYLOPRIM) 100 MG tablet Take 100 mg by mouth daily.      Marland Kitchen atorvastatin (LIPITOR) 10 MG tablet Take 10 mg by mouth daily.      . calcium carbonate 200 MG capsule Take 250 mg by mouth 2 (two) times daily with a meal.      . cholecalciferol (VITAMIN D) 400 UNITS TABS Take by mouth.      . metFORMIN (GLUCOPHAGE) 1000 MG tablet Take 1,000 mg by mouth 2 (two) times daily with a meal.      . omeprazole (PRILOSEC) 10 MG  capsule Take 10 mg by mouth daily.      . polyethylene glycol (MIRALAX / GLYCOLAX) packet Take 17 g by mouth daily.       No current facility-administered medications for this encounter.    REVIEW OF SYSTEMS:  A 15 point review of systems is documented in the electronic medical record. This was obtained by the nursing staff. However, I reviewed this with the patient to discuss relevant findings and make appropriate changes.  Pertinent items are noted in HPI.   PHYSICAL EXAM:  Filed Vitals:   08/03/12 1429  BP: 145/83  Pulse: 86  Temp: 98.7 F (37.1 C)  Resp: 20  . She is a  pleasant female in no distress sitting comfortably examining table. She has no palpable right or left cervical or supraclavicular neck nodes. She is thin. Examination of the oral cavity and oropharynx reveals a large exophytic right tonsillar mass extending up towards the uvula and towards the midlineof  the tongue base without crossing over. There are no other abnormalities.  LABORATORY DATA:  Lab Results  Component Value Date   WBC 6.6 09/28/2010   HGB 8.8 RESULT REPEATED AND VERIFIED DELTA CHECK NOTED* 09/28/2010   HCT 24.1* 09/28/2010   MCV 84.9 09/28/2010   PLT 239 09/28/2010   Lab Results  Component Value Date   NA 127* 09/28/2010   K 4.4 09/28/2010   CL 94* 09/28/2010   CO2 22 09/28/2010   Lab Results  Component Value Date   ALT 29 09/26/2010   AST 40* 09/26/2010   ALKPHOS 117 09/26/2010   BILITOT 0.4 09/26/2010     RADIOGRAPHY: Nm Pet Image Initial (pi) Skull Base To Thigh  07/20/2012  *RADIOLOGY REPORT*  Clinical Data: Initial treatment strategy for carcinoma of the tonsil.  NUCLEAR MEDICINE PET SKULL BASE TO THIGH  Fasting Blood Glucose:  123  Technique:  18.6 mCi F-18 FDG was injected intravenously. CT data was obtained and used for attenuation correction and anatomic localization only.  (This was not acquired as a diagnostic CT examination.) Additional exam technical data entered on technologist worksheet.  Comparison:  Neck CT 06/1978 09/2012 the  Findings:  Neck: There is an ovoid right tonsillar mass measuring approximately 16 x 15 mm (image 24) with intense metabolic activity ( SUV max = 26.2.  There is a single mildly hypermetabolic  right level II location which measures 9 mm short axis (image 31) with SUV max = 3.2.  No additional hypermetabolic lymph nodes on the right.  No contralateral hypermetabolic nodes.  Symmetric activity is noted in the salivary glands.  Chest:  No hypermetabolic mediastinal or hilar nodes. There is a 7 mm right upper lobe pulmonary nodule (image 62) which does not have  associated metabolic activity.  Abdomen/Pelvis:  No abnormal hypermetabolic activity within the liver, pancreas, adrenal glands, or spleen.  No hypermetabolic lymph nodes in the abdomen or pelvis.  Skeleton:  No focal hypermetabolic activity to suggest skeletal metastasis.  IMPRESSION:  1.  Hypermetabolic right tonsillar lesion consistent with primary carcinoma. 2.  Mildly hypermetabolic right level II lymph node with differential including reactive node versus metastatic node. 3.  No evidence of contralateral hypermetabolic nodes. 4.  Right upper lobe pulmonary nodule without associated metabolic activity.  Recommend attention on follow-up.   Original Report Authenticated By: Suzy Bouchard, M.D.       IMPRESSION: T2 N1 (vs N0) HPV negative squamous cell carcinoma of the right tonsil  PLAN: I spoke with the patient  today. There is evidence that early stage tonsil cancers can be treated with radiation alone. A hypofractionated or dose escalated approach is recommended. We discussed the process of simulation the making a mask. We discussed acute effects of treatment including loss of taste, dry mouth skin darkness and sore throat. We discussd long-term side effects including permanent xerostomia and difficulty swallowing. At this point that lymph node is the only question that makes me wonder whether she would require her need chemotherapy. According to the NCCN guidelines if she is T2 N1 chemoradiation should be considered. I hesitate to escalate treatment however due to the significant increase in toxicity. I discussed this with her as well as Dr. Nicolette Bang. We agreed to attempt a biopsy of this lymph node. It was positive she would be referred for chemotherapy. If it was negative we would proceed on with radiation alone. I will also ask our dentist to obtain her outside films. She was confident that her dentist had performed a full evaluation. She is anxious to proceed forward with treatment. We will simulate  her later this week and plan on starting her treatment in 7-10 days. I also referred her to speech therapy as well as to our cancer center dietitian. We also had our Education officer, museum meet with her today. spent 60 minutes  face to face with the patient and more than 50% of that time was spent in counseling and/or coordination of care.   ------------------------------------------------  [name], MD

## 2012-08-04 NOTE — Telephone Encounter (Signed)
CALLED PATIENT TO INFORM OF APPTS., LVM FOR A RETURN CALL 

## 2012-08-04 NOTE — Telephone Encounter (Signed)
Called pt and informed her that she needs to have BUN, creatinine labs drawn before her ct sim on 08/05/12. Offered for pt to have labs drawn either today or tomorrow prior to her IV start at 1:15 pm. Pt decided to have labs drawn today at 3 pm. Dr Pablo Ledger notified, will drop orders into Epic. S Halsey scheduled pt's lab appt for 3 pm on 08/04/12. Pt verbalized agreement, understanding. C Huntley, RT in CT sim dept notified, verbalized understanding.

## 2012-08-04 NOTE — Addendum Note (Signed)
Encounter addended by: [name], MD on: 08/04/2012  9:51 AM<BR>     Documentation filed: Notes Section, Follow-up Section, LOS Section, Clinical Notes, Visit Diagnoses, Orders

## 2012-08-04 NOTE — Telephone Encounter (Signed)
Called patient to inform of test and nutrition appt. And speech therapy, lvm for a return call

## 2012-08-05 ENCOUNTER — Ambulatory Visit
Admission: RE | Admit: 2012-08-05 | Discharge: 2012-08-05 | Disposition: A | Discharge status: Home / Self Care | Payer: Medicare Other | Source: Ambulatory Visit | Attending: Radiation Oncology | Admitting: Radiation Oncology

## 2012-08-05 VITALS — BP 137/80 | HR 82 | Temp 97.7°F | Resp 16

## 2012-08-05 VITALS — Wt 139.5 lb

## 2012-08-05 DIAGNOSIS — K117 Disturbances of salivary secretion: Secondary | ICD-10-CM | POA: Insufficient documentation

## 2012-08-05 DIAGNOSIS — E119 Type 2 diabetes mellitus without complications: Secondary | ICD-10-CM | POA: Diagnosis not present

## 2012-08-05 DIAGNOSIS — R112 Nausea with vomiting, unspecified: Secondary | ICD-10-CM | POA: Insufficient documentation

## 2012-08-05 DIAGNOSIS — Y842 Radiological procedure and radiotherapy as the cause of abnormal reaction of the patient, or of later complication, without mention of misadventure at the time of the procedure: Secondary | ICD-10-CM | POA: Insufficient documentation

## 2012-08-05 DIAGNOSIS — K121 Other forms of stomatitis: Secondary | ICD-10-CM | POA: Insufficient documentation

## 2012-08-05 DIAGNOSIS — Z79899 Other long term (current) drug therapy: Secondary | ICD-10-CM | POA: Diagnosis not present

## 2012-08-05 DIAGNOSIS — L988 Other specified disorders of the skin and subcutaneous tissue: Secondary | ICD-10-CM | POA: Insufficient documentation

## 2012-08-05 DIAGNOSIS — C099 Malignant neoplasm of tonsil, unspecified: Secondary | ICD-10-CM | POA: Insufficient documentation

## 2012-08-05 DIAGNOSIS — Z510 Encounter for antineoplastic radiation therapy: Secondary | ICD-10-CM | POA: Insufficient documentation

## 2012-08-05 DIAGNOSIS — K209 Esophagitis, unspecified without bleeding: Secondary | ICD-10-CM | POA: Diagnosis not present

## 2012-08-05 DIAGNOSIS — K219 Gastro-esophageal reflux disease without esophagitis: Secondary | ICD-10-CM | POA: Diagnosis not present

## 2012-08-05 DIAGNOSIS — R070 Pain in throat: Secondary | ICD-10-CM | POA: Insufficient documentation

## 2012-08-05 DIAGNOSIS — K1230 Oral mucositis (ulcerative), unspecified: Secondary | ICD-10-CM | POA: Diagnosis not present

## 2012-08-05 MED ORDER — SODIUM CHLORIDE 0.9 % IJ SOLN
10.0000 mL | Freq: Once | INTRAMUSCULAR | Status: AC
  Administered 2012-08-05: 10 mL via INTRAVENOUS

## 2012-08-05 NOTE — Progress Notes (Signed)
Attempted two to start peripheral IV. Both unsuccessful. Amy, RN of medical oncology started left posterior forearm 22 gauge on the first attempt. Patient has very sclerotic veins. Patient tolerated well.

## 2012-08-05 NOTE — Progress Notes (Signed)
[facility] Simulation/Treatment Planning/IMRT note   LENNOX KANTOR  [id number] 08/05/2012  1946/02/17  Right tonsil cancer  CONSENT VERIFIED:yes   SET UP: Patient is set-up supine   IMMOBILIZATION: The following immobilization is used:Aquaplast Mask   NARRATIVE:The patient was brought to the [facility].  Identity was confirmed.  All relevant records and images related to the planned course of therapy were reviewed.  Then, the patient was positioned in a stable reproducible clinical set-up for radiation therapy using an aquaplast mask.  IV contrast was administered and CT images were obtained.  Skin markings were placed.  The CT images were loaded into the planning software where the target and avoidance structures were contoured.  [name] previous PET scan was fused with the planning CT to aid in target delineation. The radiation prescription was entered and confirmed.   TREATMENT PLANNING NOTE:  Treatment planning then occurred. I have requested : Intensity Modulated Radiotherapy (IMRT) is medically necessary for this case for the following reason:  Dose homogeneity and treatment of a head and neck site.

## 2012-08-08 ENCOUNTER — Ambulatory Visit
Admission: RE | Admit: 2012-08-08 | Discharge: 2012-08-08 | Disposition: A | Discharge status: Home / Self Care | Payer: Medicare Other | Source: Ambulatory Visit | Attending: Radiation Oncology | Admitting: Radiation Oncology

## 2012-08-08 ENCOUNTER — Other Ambulatory Visit: Payer: Self-pay | Admitting: Radiology

## 2012-08-08 ENCOUNTER — Other Ambulatory Visit: Payer: Self-pay | Admitting: Radiation Oncology

## 2012-08-08 DIAGNOSIS — C801 Malignant (primary) neoplasm, unspecified: Secondary | ICD-10-CM

## 2012-08-08 DIAGNOSIS — C099 Malignant neoplasm of tonsil, unspecified: Secondary | ICD-10-CM | POA: Insufficient documentation

## 2012-08-08 LAB — BUN AND CREATININE (CC13)
BUN: 7.5 mg/dL (ref 7.0–26.0)
Creatinine: 1 mg/dL (ref 0.6–1.1)

## 2012-08-08 NOTE — Addendum Note (Signed)
Encounter addended by: Andria Rhein, RN on: 08/08/2012  8:49 AM<BR>     Documentation filed: Charges VN

## 2012-08-09 ENCOUNTER — Ambulatory Visit (HOSPITAL_COMMUNITY)
Admission: RE | Admit: 2012-08-09 | Discharge: 2012-08-09 | Disposition: A | Discharge status: Home / Self Care | Payer: Medicare Other | Source: Ambulatory Visit | Attending: Radiation Oncology | Admitting: Radiation Oncology

## 2012-08-09 ENCOUNTER — Telehealth: Payer: Self-pay | Admitting: *Deleted

## 2012-08-09 DIAGNOSIS — D360 Benign neoplasm of lymph nodes: Secondary | ICD-10-CM | POA: Diagnosis not present

## 2012-08-09 DIAGNOSIS — C099 Malignant neoplasm of tonsil, unspecified: Secondary | ICD-10-CM

## 2012-08-09 DIAGNOSIS — R599 Enlarged lymph nodes, unspecified: Secondary | ICD-10-CM | POA: Diagnosis not present

## 2012-08-09 NOTE — Telephone Encounter (Signed)
Left vm informing pt 08/08/12 labs were normal, and she may resume her Metformin. Requested pt call back to verify she received and understood this message. Left name and call back number. Attempted to reach pt on mobile #, unable to leave vm.

## 2012-08-09 NOTE — Procedures (Signed)
[place] FNA R cervical LAN 0000000 [id number] x1 No complication No blood loss. See complete dictation in [facility].

## 2012-08-09 NOTE — Telephone Encounter (Signed)
Per [name], medical secretary, pt called and stated she got vm. Stated she understood that she can resume taking her Metformin.

## 2012-08-10 ENCOUNTER — Ambulatory Visit (HOSPITAL_COMMUNITY): Payer: Medicare Other

## 2012-08-11 ENCOUNTER — Ambulatory Visit: Payer: Medicare Other | Admitting: Nutrition

## 2012-08-11 ENCOUNTER — Telehealth: Payer: Self-pay | Admitting: *Deleted

## 2012-08-11 DIAGNOSIS — Z510 Encounter for antineoplastic radiation therapy: Secondary | ICD-10-CM | POA: Diagnosis not present

## 2012-08-11 DIAGNOSIS — C099 Malignant neoplasm of tonsil, unspecified: Secondary | ICD-10-CM | POA: Diagnosis not present

## 2012-08-11 DIAGNOSIS — K117 Disturbances of salivary secretion: Secondary | ICD-10-CM | POA: Diagnosis not present

## 2012-08-11 DIAGNOSIS — E119 Type 2 diabetes mellitus without complications: Secondary | ICD-10-CM | POA: Diagnosis not present

## 2012-08-11 DIAGNOSIS — R112 Nausea with vomiting, unspecified: Secondary | ICD-10-CM | POA: Diagnosis not present

## 2012-08-11 DIAGNOSIS — R070 Pain in throat: Secondary | ICD-10-CM | POA: Diagnosis not present

## 2012-08-11 NOTE — Telephone Encounter (Signed)
1:51 pm Spoke w/pt and informed her, per Dr Pablo Ledger, that she "has no cancer in the lymph node. No chemo." Pt verbalized thanks and understanding.

## 2012-08-11 NOTE — Progress Notes (Signed)
Patient is a 67 year old female diagnosed with tonsil cancer to receive radiation therapy. She is a patient of Dr. Pablo Ledger.  Past medical history includes diabetes, GERD, gout, hypercholesterolemia, renal insufficiency, and hypertension.  Medications include metformin, Lipitor, calcium carbonate, vitamin D, Glucophage, Prilosec, and MiraLax.  Labs include BUN of 7.5 and creatinine 1.0 on April 14.  Height: 65.5 inches. Weight: 139.5 pounds April 11.  BMI: 22.85.  Patient has done a lot of reading on side effects that she expects she will have during treatment for tonsil cancer. She states she will not need chemotherapy. She has questions regarding specific foods she should or should not eat during treatment.  Nutrition diagnosis: Food and nutrition related knowledge deficit related to new diagnosis of tonsil cancer and associated treatments as evidenced by no prior need for nutrition related information.  Intervention: I educated patient on strategies for increasing oral intake in small amounts of foods and liquids throughout the day. We've discussed the importance of high-calorie high-protein foods to promote weight maintenance. I've educated her on oral nutrition supplements and high-calorie snacks for her to consume. I've educated her on strategies for altering texture and temperature of food. I provided fact sheets and contact information.  Monitoring, evaluation, goals: Patient will tolerate adequate calories and protein to minimize weight loss throughout treatment.  Next visit: Wednesday, May 7.

## 2012-08-15 ENCOUNTER — Ambulatory Visit: Payer: Medicare Other

## 2012-08-15 ENCOUNTER — Ambulatory Visit: Payer: Medicare Other | Attending: Radiation Oncology

## 2012-08-15 DIAGNOSIS — C099 Malignant neoplasm of tonsil, unspecified: Secondary | ICD-10-CM | POA: Insufficient documentation

## 2012-08-15 DIAGNOSIS — IMO0001 Reserved for inherently not codable concepts without codable children: Secondary | ICD-10-CM | POA: Insufficient documentation

## 2012-08-15 DIAGNOSIS — K219 Gastro-esophageal reflux disease without esophagitis: Secondary | ICD-10-CM | POA: Diagnosis not present

## 2012-08-16 ENCOUNTER — Ambulatory Visit
Admission: RE | Admit: 2012-08-16 | Discharge: 2012-08-16 | Disposition: A | Discharge status: Home / Self Care | Payer: Medicare Other | Source: Ambulatory Visit | Attending: Radiation Oncology | Admitting: Radiation Oncology

## 2012-08-16 ENCOUNTER — Ambulatory Visit: Payer: Medicare Other

## 2012-08-16 ENCOUNTER — Ambulatory Visit: Payer: Medicare Other | Admitting: Radiation Oncology

## 2012-08-16 DIAGNOSIS — Z510 Encounter for antineoplastic radiation therapy: Secondary | ICD-10-CM | POA: Diagnosis not present

## 2012-08-16 DIAGNOSIS — C099 Malignant neoplasm of tonsil, unspecified: Secondary | ICD-10-CM

## 2012-08-16 DIAGNOSIS — R070 Pain in throat: Secondary | ICD-10-CM | POA: Diagnosis not present

## 2012-08-16 DIAGNOSIS — K117 Disturbances of salivary secretion: Secondary | ICD-10-CM | POA: Diagnosis not present

## 2012-08-16 DIAGNOSIS — E119 Type 2 diabetes mellitus without complications: Secondary | ICD-10-CM | POA: Diagnosis not present

## 2012-08-16 DIAGNOSIS — R112 Nausea with vomiting, unspecified: Secondary | ICD-10-CM | POA: Diagnosis not present

## 2012-08-16 MED ORDER — HYDROCODONE-ACETAMINOPHEN 7.5-325 MG/15ML PO SOLN: 15.0000 mL | Freq: Four times a day (QID) | ORAL | Status: DC | PRN

## 2012-08-16 NOTE — Progress Notes (Signed)
Weekly Management Note Current Dose:2.2 Gy  Projected Dose: 66 Gy   Narrative:  The patient presents for routine under treatment assessment.  CBCT/MVCT images/Port film x-rays were reviewed.  The chart was checked. Doing well. Has seen speech and dietician.  RN education today. Questions about teeth anc skin also if it was ok to chew nicotine gum. Still not smoking. More pain now at night. Referred pain to ear.   Physical Findings:  Unchanged  Vitals: There were no vitals filed for this visit. Weight:  Wt Readings from Last 3 Encounters:  08/05/12 139 lb 8 oz (63.277 kg)  08/03/12 139 lb 6.4 oz (63.231 kg)   Lab Results  Component Value Date   WBC 6.6 09/28/2010   HGB 8.8 RESULT REPEATED AND VERIFIED DELTA CHECK NOTED* 09/28/2010   HCT 24.1* 09/28/2010   MCV 84.9 09/28/2010   PLT 239 09/28/2010   Lab Results  Component Value Date   CREATININE 1.0 08/08/2012   BUN 7.5 08/08/2012   NA 127* 09/28/2010   K 4.4 09/28/2010   CL 94* 09/28/2010   CO2 22 09/28/2010     Impression:  The patient is tolerating radiation.  Plan:  Continue treatment as planned. Discussed no chemo and prior eval by her dentist is probably ok. Due to increasing pain, I believe we should start now and not wait for further tumor growth.

## 2012-08-16 NOTE — Progress Notes (Signed)
See by MD prior to assessment.

## 2012-08-17 ENCOUNTER — Ambulatory Visit: Payer: Medicare Other

## 2012-08-17 ENCOUNTER — Ambulatory Visit
Admission: RE | Admit: 2012-08-17 | Discharge: 2012-08-17 | Disposition: A | Discharge status: Home / Self Care | Payer: Medicare Other | Source: Ambulatory Visit | Attending: Radiation Oncology | Admitting: Radiation Oncology

## 2012-08-17 DIAGNOSIS — R112 Nausea with vomiting, unspecified: Secondary | ICD-10-CM | POA: Diagnosis not present

## 2012-08-17 DIAGNOSIS — E119 Type 2 diabetes mellitus without complications: Secondary | ICD-10-CM | POA: Diagnosis not present

## 2012-08-17 DIAGNOSIS — Z510 Encounter for antineoplastic radiation therapy: Secondary | ICD-10-CM | POA: Diagnosis not present

## 2012-08-17 DIAGNOSIS — R070 Pain in throat: Secondary | ICD-10-CM | POA: Diagnosis not present

## 2012-08-17 DIAGNOSIS — C099 Malignant neoplasm of tonsil, unspecified: Secondary | ICD-10-CM | POA: Diagnosis not present

## 2012-08-17 DIAGNOSIS — K117 Disturbances of salivary secretion: Secondary | ICD-10-CM | POA: Diagnosis not present

## 2012-08-18 ENCOUNTER — Ambulatory Visit
Admission: RE | Admit: 2012-08-18 | Discharge: 2012-08-18 | Disposition: A | Discharge status: Home / Self Care | Payer: Medicare Other | Source: Ambulatory Visit | Attending: Radiation Oncology | Admitting: Radiation Oncology

## 2012-08-18 ENCOUNTER — Ambulatory Visit: Payer: Medicare Other

## 2012-08-18 DIAGNOSIS — R112 Nausea with vomiting, unspecified: Secondary | ICD-10-CM | POA: Diagnosis not present

## 2012-08-18 DIAGNOSIS — C099 Malignant neoplasm of tonsil, unspecified: Secondary | ICD-10-CM | POA: Diagnosis not present

## 2012-08-18 DIAGNOSIS — R070 Pain in throat: Secondary | ICD-10-CM | POA: Diagnosis not present

## 2012-08-18 DIAGNOSIS — Z510 Encounter for antineoplastic radiation therapy: Secondary | ICD-10-CM | POA: Diagnosis not present

## 2012-08-18 DIAGNOSIS — E119 Type 2 diabetes mellitus without complications: Secondary | ICD-10-CM | POA: Diagnosis not present

## 2012-08-18 DIAGNOSIS — K117 Disturbances of salivary secretion: Secondary | ICD-10-CM | POA: Diagnosis not present

## 2012-08-19 ENCOUNTER — Ambulatory Visit: Payer: Medicare Other

## 2012-08-19 ENCOUNTER — Ambulatory Visit
Admission: RE | Admit: 2012-08-19 | Discharge: 2012-08-19 | Disposition: A | Discharge status: Home / Self Care | Payer: Medicare Other | Source: Ambulatory Visit | Attending: Radiation Oncology | Admitting: Radiation Oncology

## 2012-08-19 DIAGNOSIS — R112 Nausea with vomiting, unspecified: Secondary | ICD-10-CM | POA: Diagnosis not present

## 2012-08-19 DIAGNOSIS — R070 Pain in throat: Secondary | ICD-10-CM | POA: Diagnosis not present

## 2012-08-19 DIAGNOSIS — K117 Disturbances of salivary secretion: Secondary | ICD-10-CM | POA: Diagnosis not present

## 2012-08-19 DIAGNOSIS — Z510 Encounter for antineoplastic radiation therapy: Secondary | ICD-10-CM | POA: Diagnosis not present

## 2012-08-19 DIAGNOSIS — C099 Malignant neoplasm of tonsil, unspecified: Secondary | ICD-10-CM | POA: Diagnosis not present

## 2012-08-19 DIAGNOSIS — E119 Type 2 diabetes mellitus without complications: Secondary | ICD-10-CM | POA: Diagnosis not present

## 2012-08-19 NOTE — Progress Notes (Signed)
Late entry: Education regarding management of radiation therapy side-effects reviewed on 08/16/12 in regards to pain, dysphagia, odonyphagia, skin care, mouth care and need for optimal protein/calorie intake.  Patient was given the Radiation Therapy and you booklet and education was reiteration of information patient had already received and information she obtained prior to her 1st treatment.  Very engaged and committed to her personal care.

## 2012-08-22 ENCOUNTER — Ambulatory Visit
Admission: RE | Admit: 2012-08-22 | Discharge: 2012-08-22 | Disposition: A | Discharge status: Home / Self Care | Payer: Medicare Other | Source: Ambulatory Visit | Attending: Radiation Oncology | Admitting: Radiation Oncology

## 2012-08-22 ENCOUNTER — Ambulatory Visit: Payer: Medicare Other

## 2012-08-22 DIAGNOSIS — C099 Malignant neoplasm of tonsil, unspecified: Secondary | ICD-10-CM | POA: Diagnosis not present

## 2012-08-22 DIAGNOSIS — E119 Type 2 diabetes mellitus without complications: Secondary | ICD-10-CM | POA: Diagnosis not present

## 2012-08-22 DIAGNOSIS — R070 Pain in throat: Secondary | ICD-10-CM | POA: Diagnosis not present

## 2012-08-22 DIAGNOSIS — K117 Disturbances of salivary secretion: Secondary | ICD-10-CM | POA: Diagnosis not present

## 2012-08-22 DIAGNOSIS — Z510 Encounter for antineoplastic radiation therapy: Secondary | ICD-10-CM | POA: Diagnosis not present

## 2012-08-22 DIAGNOSIS — R112 Nausea with vomiting, unspecified: Secondary | ICD-10-CM | POA: Diagnosis not present

## 2012-08-23 ENCOUNTER — Ambulatory Visit
Admission: RE | Admit: 2012-08-23 | Discharge: 2012-08-23 | Disposition: A | Discharge status: Home / Self Care | Payer: Medicare Other | Source: Ambulatory Visit | Attending: Radiation Oncology | Admitting: Radiation Oncology

## 2012-08-23 ENCOUNTER — Ambulatory Visit: Payer: Medicare Other

## 2012-08-23 ENCOUNTER — Encounter: Payer: Self-pay | Admitting: Radiation Oncology

## 2012-08-23 VITALS — BP 144/77 | HR 76 | Temp 97.8°F | Resp 20 | Wt 138.6 lb

## 2012-08-23 DIAGNOSIS — E119 Type 2 diabetes mellitus without complications: Secondary | ICD-10-CM | POA: Diagnosis not present

## 2012-08-23 DIAGNOSIS — Z510 Encounter for antineoplastic radiation therapy: Secondary | ICD-10-CM | POA: Diagnosis not present

## 2012-08-23 DIAGNOSIS — C099 Malignant neoplasm of tonsil, unspecified: Secondary | ICD-10-CM

## 2012-08-23 DIAGNOSIS — K117 Disturbances of salivary secretion: Secondary | ICD-10-CM | POA: Diagnosis not present

## 2012-08-23 DIAGNOSIS — R070 Pain in throat: Secondary | ICD-10-CM | POA: Diagnosis not present

## 2012-08-23 DIAGNOSIS — R112 Nausea with vomiting, unspecified: Secondary | ICD-10-CM | POA: Diagnosis not present

## 2012-08-23 NOTE — Progress Notes (Signed)
Weekly Management Note Current Dose:13.2 Gy  Projected Dose:66 Gy   Narrative:  The patient presents for routine under treatment assessment.  CBCT/MVCT images/Port film x-rays were reviewed.  The chart was checked. Ear and throat pain. Not taking anything. Ear itches. Worried about her sugan levels while her po intake is so variable. Labs by PCP on Thursday.   Physical Findings:  Some red over tonsillar mass and base of tongue.   Vitals:  Filed Vitals:   08/23/12 1554  BP: 144/77  Pulse: 76  Temp: 97.8 F (36.6 C)  Resp: 20   Weight:  Wt Readings from Last 3 Encounters:  08/23/12 138 lb 9.6 oz (62.869 kg)  08/05/12 139 lb 8 oz (63.277 kg)  08/03/12 139 lb 6.4 oz (63.231 kg)   Lab Results  Component Value Date   WBC 6.6 09/28/2010   HGB 8.8 RESULT REPEATED AND VERIFIED DELTA CHECK NOTED* 09/28/2010   HCT 24.1* 09/28/2010   MCV 84.9 09/28/2010   PLT 239 09/28/2010   Lab Results  Component Value Date   CREATININE 1.0 08/08/2012   BUN 7.5 08/08/2012   NA 127* 09/28/2010   K 4.4 09/28/2010   CL 94* 09/28/2010   CO2 22 09/28/2010     Impression:  The patient is tolerating radiation.  Plan:  Continue treatment as planned. Discussed connection between ear pain and tonsillar treatment. She has met with our dietician. I encouraged her to talk to PCP about sliding scale insulin as her po intake will be variable for about the next 2 months.  We will continue to watch her weight.

## 2012-08-23 NOTE — Progress Notes (Signed)
Pt c/o moderate pain in her right ear, right side of neck since last Fri night. She states it is intermittent, lasting about 30 min. She has not taken any meds for this pain. She states she sometimes feels as if she has bitten her tongue on the right side. Pt denies difficulty eating, swallowing, loss of appetite, fatigue. Pt applying Radiaplex to right side of neck.

## 2012-08-24 ENCOUNTER — Ambulatory Visit
Admission: RE | Admit: 2012-08-24 | Discharge: 2012-08-24 | Disposition: A | Discharge status: Home / Self Care | Payer: Medicare Other | Source: Ambulatory Visit | Attending: Radiation Oncology | Admitting: Radiation Oncology

## 2012-08-24 ENCOUNTER — Telehealth: Payer: Self-pay | Admitting: Radiation Oncology

## 2012-08-24 ENCOUNTER — Ambulatory Visit: Payer: Medicare Other

## 2012-08-24 ENCOUNTER — Encounter: Payer: Medicare Other | Admitting: Nutrition

## 2012-08-24 DIAGNOSIS — R070 Pain in throat: Secondary | ICD-10-CM | POA: Diagnosis not present

## 2012-08-24 DIAGNOSIS — C099 Malignant neoplasm of tonsil, unspecified: Secondary | ICD-10-CM | POA: Diagnosis not present

## 2012-08-24 DIAGNOSIS — E119 Type 2 diabetes mellitus without complications: Secondary | ICD-10-CM | POA: Diagnosis not present

## 2012-08-24 DIAGNOSIS — Z510 Encounter for antineoplastic radiation therapy: Secondary | ICD-10-CM | POA: Diagnosis not present

## 2012-08-24 DIAGNOSIS — R112 Nausea with vomiting, unspecified: Secondary | ICD-10-CM | POA: Diagnosis not present

## 2012-08-24 DIAGNOSIS — K117 Disturbances of salivary secretion: Secondary | ICD-10-CM | POA: Diagnosis not present

## 2012-08-24 NOTE — Telephone Encounter (Signed)
Faxed records to Dr. Felipa Eth.  Received confirmation.  NPE 08/03/12, NM 07/20/12, CT neck 06/30/12, PUT 08/16/12, glucose 07/20/12.  OK per SW.

## 2012-08-25 ENCOUNTER — Ambulatory Visit
Admission: RE | Admit: 2012-08-25 | Discharge: 2012-08-25 | Disposition: A | Discharge status: Home / Self Care | Payer: Medicare Other | Source: Ambulatory Visit | Attending: Radiation Oncology | Admitting: Radiation Oncology

## 2012-08-25 ENCOUNTER — Ambulatory Visit: Payer: Medicare Other

## 2012-08-25 DIAGNOSIS — K117 Disturbances of salivary secretion: Secondary | ICD-10-CM | POA: Diagnosis not present

## 2012-08-25 DIAGNOSIS — R070 Pain in throat: Secondary | ICD-10-CM | POA: Diagnosis not present

## 2012-08-25 DIAGNOSIS — R112 Nausea with vomiting, unspecified: Secondary | ICD-10-CM | POA: Diagnosis not present

## 2012-08-25 DIAGNOSIS — C099 Malignant neoplasm of tonsil, unspecified: Secondary | ICD-10-CM | POA: Diagnosis not present

## 2012-08-25 DIAGNOSIS — E119 Type 2 diabetes mellitus without complications: Secondary | ICD-10-CM | POA: Diagnosis not present

## 2012-08-25 DIAGNOSIS — Z510 Encounter for antineoplastic radiation therapy: Secondary | ICD-10-CM | POA: Diagnosis not present

## 2012-08-26 ENCOUNTER — Ambulatory Visit: Payer: Medicare Other

## 2012-08-26 ENCOUNTER — Ambulatory Visit
Admission: RE | Admit: 2012-08-26 | Discharge: 2012-08-26 | Disposition: A | Discharge status: Home / Self Care | Payer: Medicare Other | Source: Ambulatory Visit | Attending: Radiation Oncology | Admitting: Radiation Oncology

## 2012-08-26 DIAGNOSIS — IMO0001 Reserved for inherently not codable concepts without codable children: Secondary | ICD-10-CM | POA: Diagnosis not present

## 2012-08-26 DIAGNOSIS — E119 Type 2 diabetes mellitus without complications: Secondary | ICD-10-CM | POA: Diagnosis not present

## 2012-08-26 DIAGNOSIS — R112 Nausea with vomiting, unspecified: Secondary | ICD-10-CM | POA: Diagnosis not present

## 2012-08-26 DIAGNOSIS — Z510 Encounter for antineoplastic radiation therapy: Secondary | ICD-10-CM | POA: Diagnosis not present

## 2012-08-26 DIAGNOSIS — C099 Malignant neoplasm of tonsil, unspecified: Secondary | ICD-10-CM | POA: Diagnosis not present

## 2012-08-26 DIAGNOSIS — R070 Pain in throat: Secondary | ICD-10-CM | POA: Diagnosis not present

## 2012-08-26 DIAGNOSIS — K117 Disturbances of salivary secretion: Secondary | ICD-10-CM | POA: Diagnosis not present

## 2012-08-29 ENCOUNTER — Ambulatory Visit: Payer: Medicare Other

## 2012-08-29 ENCOUNTER — Ambulatory Visit
Admission: RE | Admit: 2012-08-29 | Discharge: 2012-08-29 | Disposition: A | Discharge status: Home / Self Care | Payer: Medicare Other | Source: Ambulatory Visit | Attending: Radiation Oncology | Admitting: Radiation Oncology

## 2012-08-29 DIAGNOSIS — E119 Type 2 diabetes mellitus without complications: Secondary | ICD-10-CM | POA: Diagnosis not present

## 2012-08-29 DIAGNOSIS — Z510 Encounter for antineoplastic radiation therapy: Secondary | ICD-10-CM | POA: Diagnosis not present

## 2012-08-29 DIAGNOSIS — C099 Malignant neoplasm of tonsil, unspecified: Secondary | ICD-10-CM | POA: Diagnosis not present

## 2012-08-29 DIAGNOSIS — R112 Nausea with vomiting, unspecified: Secondary | ICD-10-CM | POA: Diagnosis not present

## 2012-08-29 DIAGNOSIS — R070 Pain in throat: Secondary | ICD-10-CM | POA: Diagnosis not present

## 2012-08-29 DIAGNOSIS — K117 Disturbances of salivary secretion: Secondary | ICD-10-CM | POA: Diagnosis not present

## 2012-08-30 ENCOUNTER — Ambulatory Visit: Payer: Medicare Other

## 2012-08-30 ENCOUNTER — Ambulatory Visit
Admission: RE | Admit: 2012-08-30 | Discharge: 2012-08-30 | Disposition: A | Discharge status: Home / Self Care | Payer: Medicare Other | Source: Ambulatory Visit | Attending: Radiation Oncology | Admitting: Radiation Oncology

## 2012-08-30 VITALS — BP 145/73 | HR 63 | Temp 98.2°F | Wt 136.0 lb

## 2012-08-30 DIAGNOSIS — E119 Type 2 diabetes mellitus without complications: Secondary | ICD-10-CM | POA: Diagnosis not present

## 2012-08-30 DIAGNOSIS — C099 Malignant neoplasm of tonsil, unspecified: Secondary | ICD-10-CM | POA: Diagnosis not present

## 2012-08-30 DIAGNOSIS — K117 Disturbances of salivary secretion: Secondary | ICD-10-CM | POA: Diagnosis not present

## 2012-08-30 DIAGNOSIS — R070 Pain in throat: Secondary | ICD-10-CM | POA: Diagnosis not present

## 2012-08-30 DIAGNOSIS — Z510 Encounter for antineoplastic radiation therapy: Secondary | ICD-10-CM | POA: Diagnosis not present

## 2012-08-30 DIAGNOSIS — R112 Nausea with vomiting, unspecified: Secondary | ICD-10-CM | POA: Diagnosis not present

## 2012-08-30 MED ORDER — HYDROCODONE-ACETAMINOPHEN 7.5-325 MG/15ML PO SOLN: 15.0000 mL | Freq: Four times a day (QID) | ORAL | Status: DC | PRN

## 2012-08-30 MED ORDER — OXYCODONE HCL 10 MG PO TB12: 10.0000 mg | ORAL_TABLET | Freq: Two times a day (BID) | ORAL | Status: DC

## 2012-08-30 NOTE — Progress Notes (Signed)
Weekly Management Note Current Dose: 24.2Gy  Projected Dose: 66 Gy   Narrative:  The patient presents for routine under treatment assessment.  CBCT/MVCT images/Port film x-rays were reviewed.  The chart was checked.More pain. Constant. Hycet only helps for an hour and allows her to eat. Ear, throat pain mostly. MMW she is only using in her mouth. Taking 4-5 times per day and pain level is very high after hydrocodone wears off. Concerned about addiction. Decreased appetite but forcing self to eat.   Physical Findings:  Unchanged  Vitals:  Filed Vitals:   08/30/12 1222  BP: 145/73  Pulse: 63  Temp: 98.2 F (36.8 C)   Weight:  Wt Readings from Last 3 Encounters:  08/30/12 136 lb (61.689 kg)  08/23/12 138 lb 9.6 oz (62.869 kg)  08/05/12 139 lb 8 oz (63.277 kg)   Lab Results  Component Value Date   WBC 6.6 09/28/2010   HGB 8.8 RESULT REPEATED AND VERIFIED DELTA CHECK NOTED* 09/28/2010   HCT 24.1* 09/28/2010   MCV 84.9 09/28/2010   PLT 239 09/28/2010   Lab Results  Component Value Date   CREATININE 1.0 08/08/2012   BUN 7.5 08/08/2012   NA 127* 09/28/2010   K 4.4 09/28/2010   CL 94* 09/28/2010   CO2 22 09/28/2010     Impression:  The patient is tolerating radiation.  Plan:  Continue treatment as planned. Add oxycontin. Continue prn hydrocodone. Will ask dietician to check in.

## 2012-08-30 NOTE — Progress Notes (Signed)
Patient here for routine weekly assessment of radiation to head/neck for right tonsillar treatment.Pain level "6" hydrocodone gives only 1 hour of relief.Glucophage increased by PCP on 08/26/12 to 2000 mg bid.

## 2012-08-31 ENCOUNTER — Ambulatory Visit: Payer: Medicare Other

## 2012-08-31 ENCOUNTER — Ambulatory Visit: Payer: Medicare Other | Admitting: Nutrition

## 2012-08-31 ENCOUNTER — Ambulatory Visit
Admission: RE | Admit: 2012-08-31 | Discharge: 2012-08-31 | Disposition: A | Discharge status: Home / Self Care | Payer: Medicare Other | Source: Ambulatory Visit | Attending: Radiation Oncology | Admitting: Radiation Oncology

## 2012-08-31 DIAGNOSIS — R112 Nausea with vomiting, unspecified: Secondary | ICD-10-CM | POA: Diagnosis not present

## 2012-08-31 DIAGNOSIS — R070 Pain in throat: Secondary | ICD-10-CM | POA: Diagnosis not present

## 2012-08-31 DIAGNOSIS — C099 Malignant neoplasm of tonsil, unspecified: Secondary | ICD-10-CM | POA: Diagnosis not present

## 2012-08-31 DIAGNOSIS — K117 Disturbances of salivary secretion: Secondary | ICD-10-CM | POA: Diagnosis not present

## 2012-08-31 DIAGNOSIS — E119 Type 2 diabetes mellitus without complications: Secondary | ICD-10-CM | POA: Diagnosis not present

## 2012-08-31 DIAGNOSIS — Z510 Encounter for antineoplastic radiation therapy: Secondary | ICD-10-CM | POA: Diagnosis not present

## 2012-08-31 NOTE — Progress Notes (Signed)
Patient presents to nutrition followup. Weight has declined to 136 pounds on May 6 from 139.5 pounds April 11. Patient is reporting taste alterations. She has had nausea which she attributes to her Nicorette gum. She has had increased numbness and pain with swallowing.  Nutrition diagnosis: Food and nutrition related knowledge deficit continues.  Intervention: Patient was educated on strategies for improving the taste of food. I've educated her on ways to increase oral intake. I've also educated her on strategies for eating with nausea. She was encouraged to take pain medication as prescribed by her physician. Questions answered. Teach back method used.  Monitoring, evaluation, goals: Patient will tolerate adequate calories and protein to minimize weight loss throughout treatment.  Next visit: Wednesday, May 14.

## 2012-09-01 ENCOUNTER — Ambulatory Visit: Payer: Medicare Other

## 2012-09-01 ENCOUNTER — Ambulatory Visit
Admission: RE | Admit: 2012-09-01 | Discharge: 2012-09-01 | Disposition: A | Discharge status: Home / Self Care | Payer: Medicare Other | Source: Ambulatory Visit | Attending: Radiation Oncology | Admitting: Radiation Oncology

## 2012-09-01 DIAGNOSIS — K117 Disturbances of salivary secretion: Secondary | ICD-10-CM | POA: Diagnosis not present

## 2012-09-01 DIAGNOSIS — C099 Malignant neoplasm of tonsil, unspecified: Secondary | ICD-10-CM | POA: Diagnosis not present

## 2012-09-01 DIAGNOSIS — R070 Pain in throat: Secondary | ICD-10-CM | POA: Diagnosis not present

## 2012-09-01 DIAGNOSIS — E119 Type 2 diabetes mellitus without complications: Secondary | ICD-10-CM | POA: Diagnosis not present

## 2012-09-01 DIAGNOSIS — Z510 Encounter for antineoplastic radiation therapy: Secondary | ICD-10-CM | POA: Diagnosis not present

## 2012-09-01 DIAGNOSIS — R112 Nausea with vomiting, unspecified: Secondary | ICD-10-CM | POA: Diagnosis not present

## 2012-09-02 ENCOUNTER — Ambulatory Visit: Payer: Medicare Other

## 2012-09-02 ENCOUNTER — Ambulatory Visit
Admission: RE | Admit: 2012-09-02 | Discharge: 2012-09-02 | Disposition: A | Discharge status: Home / Self Care | Payer: Medicare Other | Source: Ambulatory Visit | Attending: Radiation Oncology | Admitting: Radiation Oncology

## 2012-09-02 DIAGNOSIS — R112 Nausea with vomiting, unspecified: Secondary | ICD-10-CM | POA: Diagnosis not present

## 2012-09-02 DIAGNOSIS — C099 Malignant neoplasm of tonsil, unspecified: Secondary | ICD-10-CM | POA: Diagnosis not present

## 2012-09-02 DIAGNOSIS — R070 Pain in throat: Secondary | ICD-10-CM | POA: Diagnosis not present

## 2012-09-02 DIAGNOSIS — K117 Disturbances of salivary secretion: Secondary | ICD-10-CM | POA: Diagnosis not present

## 2012-09-02 DIAGNOSIS — Z510 Encounter for antineoplastic radiation therapy: Secondary | ICD-10-CM | POA: Diagnosis not present

## 2012-09-02 DIAGNOSIS — E119 Type 2 diabetes mellitus without complications: Secondary | ICD-10-CM | POA: Diagnosis not present

## 2012-09-03 ENCOUNTER — Ambulatory Visit
Admission: RE | Admit: 2012-09-03 | Discharge: 2012-09-03 | Disposition: A | Discharge status: Home / Self Care | Payer: Medicare Other | Source: Ambulatory Visit | Attending: Radiation Oncology | Admitting: Radiation Oncology

## 2012-09-05 ENCOUNTER — Ambulatory Visit
Admission: RE | Admit: 2012-09-05 | Discharge: 2012-09-05 | Disposition: A | Discharge status: Home / Self Care | Payer: Medicare Other | Source: Ambulatory Visit | Attending: Radiation Oncology | Admitting: Radiation Oncology

## 2012-09-05 ENCOUNTER — Telehealth: Payer: Self-pay | Admitting: *Deleted

## 2012-09-05 ENCOUNTER — Ambulatory Visit: Payer: Medicare Other

## 2012-09-05 ENCOUNTER — Encounter: Payer: Self-pay | Admitting: Radiation Oncology

## 2012-09-05 VITALS — BP 119/72 | HR 90 | Temp 97.9°F | Resp 20 | Wt 132.2 lb

## 2012-09-05 DIAGNOSIS — K117 Disturbances of salivary secretion: Secondary | ICD-10-CM | POA: Diagnosis not present

## 2012-09-05 DIAGNOSIS — E119 Type 2 diabetes mellitus without complications: Secondary | ICD-10-CM | POA: Diagnosis not present

## 2012-09-05 DIAGNOSIS — C099 Malignant neoplasm of tonsil, unspecified: Secondary | ICD-10-CM | POA: Diagnosis not present

## 2012-09-05 DIAGNOSIS — Z510 Encounter for antineoplastic radiation therapy: Secondary | ICD-10-CM | POA: Diagnosis not present

## 2012-09-05 DIAGNOSIS — R070 Pain in throat: Secondary | ICD-10-CM | POA: Diagnosis not present

## 2012-09-05 DIAGNOSIS — R112 Nausea with vomiting, unspecified: Secondary | ICD-10-CM | POA: Diagnosis not present

## 2012-09-05 MED ORDER — PROCHLORPERAZINE MALEATE 10 MG PO TABS: 10.0000 mg | ORAL_TABLET | Freq: Four times a day (QID) | ORAL | Status: DC | PRN

## 2012-09-05 NOTE — Progress Notes (Signed)
   Department of Radiation Oncology  Phone:  [phone] Fax:        [phone]  Weekly Treatment Note    Name: Courtney Bowen Date: 09/05/2012 MRN: [id number] DOB: 1945/11/17   Current dose: 33 Gy  Current fraction: 15   MEDICATIONS: Current Outpatient Prescriptions  Medication Sig Dispense Refill  . allopurinol (ZYLOPRIM) 100 MG tablet Take 100 mg by mouth daily.      Marland Kitchen atorvastatin (LIPITOR) 10 MG tablet Take 10 mg by mouth daily.      . cholecalciferol (VITAMIN D) 400 UNITS TABS Take by mouth.      Marland Kitchen HYDROcodone-acetaminophen (HYCET) 7.5-325 mg/15 ml solution Take 15 mLs by mouth 4 (four) times daily as needed for pain.  120 mL  0  . metFORMIN (GLUCOPHAGE) 1000 MG tablet Take 1,000 mg by mouth 2 (two) times daily with a meal. Increased on 08/26/12 to 2000 mg bid      . omeprazole (PRILOSEC) 10 MG capsule Take 10 mg by mouth daily.      Marland Kitchen oxyCODONE (OXYCONTIN) 10 MG 12 hr tablet Take 1 tablet (10 mg total) by mouth every 12 (twelve) hours.  60 tablet  0  . polyethylene glycol (MIRALAX / GLYCOLAX) packet Take 17 g by mouth daily.      . calcium carbonate 200 MG capsule Take 250 mg by mouth 2 (two) times daily with a meal.      . prochlorperazine (COMPAZINE) 10 MG tablet Take 1 tablet (10 mg total) by mouth every 6 (six) hours as needed.  30 tablet  0   No current facility-administered medications for this encounter.     ALLERGIES: Review of patient's allergies indicates no known allergies.   LABORATORY DATA:  Lab Results  Component Value Date   WBC 6.6 09/28/2010   HGB 8.8 RESULT REPEATED AND VERIFIED DELTA CHECK NOTED* 09/28/2010   HCT 24.1* 09/28/2010   MCV 84.9 09/28/2010   PLT 239 09/28/2010   Lab Results  Component Value Date   NA 127* 09/28/2010   K 4.4 09/28/2010   CL 94* 09/28/2010   CO2 22 09/28/2010   Lab Results  Component Value Date   ALT 29 09/26/2010   AST 40* 09/26/2010   ALKPHOS 117 09/26/2010   BILITOT 0.4 09/26/2010     NARRATIVE: Courtney Bowen was seen  today for weekly treatment management. The chart was checked and the patient's films were reviewed. The patient began experiencing some nausea last Thursday night and discontinued. This is caused some difficulty maintaining her nutrition. Vital signs were obtained today.  PHYSICAL EXAMINATION: weight is 132 lb 3.2 oz (59.966 kg). Her oral temperature is 97.9 F (36.6 C). Her blood pressure is 119/72 and her pulse is 90. Her respiration is 20 and oxygen saturation is 97%.      hyperpigmentation present within the neck region  ASSESSMENT: The patient is doing satisfactorily with treatment.  No significant orthostatic hypotension today. There was some drop in blood pressure and elevation of pulse as expected from her history over the last several days.  PLAN: We will continue with the patient's radiation treatment as planned. The patient will attempt to increase her intake and I have given her a prescription for Compazine as an anti-emetic.

## 2012-09-05 NOTE — Progress Notes (Addendum)
Patient here weekly  Rad txs, right tonsil/neck, c/o nausea and threw up last night, every time she tries to eat, has been so nauseated since Friday evening, ,nothing tastes good, takes hycet pain soln  For throat pain when swallowing, patient adament about not getting a peg tube  In her stomach", neck slight erythema, uses radiaplex daily, took ortho vitals, sitting=135/72,p=81, t=97.9,rr=20,  Standing b/p=119/72,p=90, drinks water mostly and drinks naked smoothies,  Loss 4 lbs, needs rx for nausea , fatigued

## 2012-09-05 NOTE — Telephone Encounter (Signed)
Patient called and stated she has been having nauasea since Friday afternoon, sat and Sunday, asked if she has tried gingerale and cool,moist washcloths under neck, "yes, and  Not helping much", hasn't thrown up ,asked if patient wants to come early to see on call MD first or after rad tx today at 1130, she would like to see MD after her tx today and get rx for nausea, I will let therapisty know to send her around after her tx 9:27 AM

## 2012-09-06 ENCOUNTER — Encounter: Payer: Self-pay | Admitting: Radiation Oncology

## 2012-09-06 ENCOUNTER — Ambulatory Visit
Admission: RE | Admit: 2012-09-06 | Discharge: 2012-09-06 | Disposition: A | Discharge status: Home / Self Care | Payer: Medicare Other | Source: Ambulatory Visit | Attending: Radiation Oncology | Admitting: Radiation Oncology

## 2012-09-06 ENCOUNTER — Ambulatory Visit: Payer: Medicare Other

## 2012-09-06 ENCOUNTER — Telehealth: Payer: Self-pay | Admitting: *Deleted

## 2012-09-06 VITALS — BP 125/79 | HR 90 | Temp 98.2°F | Resp 20 | Wt 132.7 lb

## 2012-09-06 DIAGNOSIS — C099 Malignant neoplasm of tonsil, unspecified: Secondary | ICD-10-CM

## 2012-09-06 DIAGNOSIS — R070 Pain in throat: Secondary | ICD-10-CM | POA: Diagnosis not present

## 2012-09-06 DIAGNOSIS — Z510 Encounter for antineoplastic radiation therapy: Secondary | ICD-10-CM | POA: Diagnosis not present

## 2012-09-06 DIAGNOSIS — R112 Nausea with vomiting, unspecified: Secondary | ICD-10-CM | POA: Diagnosis not present

## 2012-09-06 DIAGNOSIS — E119 Type 2 diabetes mellitus without complications: Secondary | ICD-10-CM | POA: Diagnosis not present

## 2012-09-06 DIAGNOSIS — K117 Disturbances of salivary secretion: Secondary | ICD-10-CM | POA: Diagnosis not present

## 2012-09-06 MED ORDER — HYDROCODONE-ACETAMINOPHEN 7.5-325 MG/15ML PO SOLN: 15.0000 mL | Freq: Four times a day (QID) | ORAL | Status: DC | PRN

## 2012-09-06 NOTE — Progress Notes (Signed)
Weekly Management Note Current Dose:35.2 Gy  Projected Dose:66 Gy   Narrative:  The patient presents for routine under treatment assessment.  CBCT/MVCT images/Port film x-rays were reviewed.  The chart was checked. Taking oxycontin 1x per day. Then hycet 3-4 times per day to help with swallowing. Using Baking soda mouthwash, biotene but still having thick secretions. Some nausea but much better with compazine bid given by Dr. Lisbeth Renshaw.  Notconstipated. Good appetite.   Physical Findings:  Slightly dark right neck.   Vitals:  Filed Vitals:   09/06/12 1150  BP: 125/79  Pulse: 90  Temp: 98.2 F (36.8 C)  Resp: 20   Weight:  Wt Readings from Last 3 Encounters:  09/06/12 132 lb 11.2 oz (60.192 kg)  09/05/12 132 lb 3.2 oz (59.966 kg)  08/30/12 136 lb (61.689 kg)   Lab Results  Component Value Date   WBC 6.6 09/28/2010   HGB 8.8 RESULT REPEATED AND VERIFIED DELTA CHECK NOTED* 09/28/2010   HCT 24.1* 09/28/2010   MCV 84.9 09/28/2010   PLT 239 09/28/2010   Lab Results  Component Value Date   CREATININE 1.0 08/08/2012   BUN 7.5 08/08/2012   NA 127* 09/28/2010   K 4.4 09/28/2010   CL 94* 09/28/2010   CO2 22 09/28/2010     Impression:  The patient is tolerating radiation.  Plan:  Continue treatment as planned. Discussed using mouthwash prior to eating. Decreasing salt intake. Increasing proteins. Meeting with dietician tomorrow. OK to double oxycontin in am. Gave refill on hycet.

## 2012-09-06 NOTE — Telephone Encounter (Signed)
Called patient home to check on nausea status, left vm will see patient after rad tx today

## 2012-09-06 NOTE — Progress Notes (Signed)
Patient here weekly rad tx, 16/30  tx completed, neck, slight pink ,skin intact, took compazine yesterday afternoon and helped, none his am, is taking miralax dialy now, bowels move qod, doesn't feel she is constipated though,  Thick saliva/mucous  in mouth, when starting to eata fods, only has had 1 can ensure today, throat pain when swallowing and talking, bioten rinses, bid, biotene gel as well, room iar 97% room air, energy  Level moderate fatigue, sees Pamala Hurry nef tomorrow

## 2012-09-07 ENCOUNTER — Ambulatory Visit: Payer: Medicare Other

## 2012-09-07 ENCOUNTER — Ambulatory Visit: Payer: Medicare Other | Admitting: Nutrition

## 2012-09-07 ENCOUNTER — Ambulatory Visit
Admission: RE | Admit: 2012-09-07 | Discharge: 2012-09-07 | Disposition: A | Discharge status: Home / Self Care | Payer: Medicare Other | Source: Ambulatory Visit | Attending: Radiation Oncology | Admitting: Radiation Oncology

## 2012-09-07 DIAGNOSIS — C099 Malignant neoplasm of tonsil, unspecified: Secondary | ICD-10-CM | POA: Diagnosis not present

## 2012-09-07 DIAGNOSIS — R112 Nausea with vomiting, unspecified: Secondary | ICD-10-CM | POA: Diagnosis not present

## 2012-09-07 DIAGNOSIS — Z510 Encounter for antineoplastic radiation therapy: Secondary | ICD-10-CM | POA: Diagnosis not present

## 2012-09-07 DIAGNOSIS — K117 Disturbances of salivary secretion: Secondary | ICD-10-CM | POA: Diagnosis not present

## 2012-09-07 DIAGNOSIS — R070 Pain in throat: Secondary | ICD-10-CM | POA: Diagnosis not present

## 2012-09-07 DIAGNOSIS — E119 Type 2 diabetes mellitus without complications: Secondary | ICD-10-CM | POA: Diagnosis not present

## 2012-09-07 NOTE — Progress Notes (Signed)
Patient presents to nutrition followup. Patient's weight has decreased to 132.7 pounds on the 13th. This is a decrease from 136 pounds on May 6. Patient reports that she has a great appetite and that her pain is improved however she has worsening thick mucus which is preventing her from being able to eat. It has caused some nausea.  Nutrition diagnosis: Food and nutrition related knowledge deficit continues.  Intervention: I educated patient on strategies for decreasing mucus. We've discussed the importance of mouth rinses. I've encouraged patient to be sure she is well hydrated. I've recommended a humidifier in her bedroom at nighttime. I've encouraged her to consume oral nutrition supplements if she is unable to tolerate thin, moist foods. I've recommended patient consumes 6 oral nutrition supplements daily if she is unable to eat. Teach back method used.  Monitoring, evaluation, goals: Patient will tolerate increased calories and protein to minimize further weight loss.  Next visit: I will followup with patient as needed by phone. She has my contact information if she has questions or concerns.

## 2012-09-08 ENCOUNTER — Ambulatory Visit: Payer: Medicare Other

## 2012-09-08 ENCOUNTER — Ambulatory Visit
Admission: RE | Admit: 2012-09-08 | Discharge: 2012-09-08 | Disposition: A | Discharge status: Home / Self Care | Payer: Medicare Other | Source: Ambulatory Visit | Attending: Radiation Oncology | Admitting: Radiation Oncology

## 2012-09-08 DIAGNOSIS — Z510 Encounter for antineoplastic radiation therapy: Secondary | ICD-10-CM | POA: Diagnosis not present

## 2012-09-08 DIAGNOSIS — R070 Pain in throat: Secondary | ICD-10-CM | POA: Diagnosis not present

## 2012-09-08 DIAGNOSIS — E119 Type 2 diabetes mellitus without complications: Secondary | ICD-10-CM | POA: Diagnosis not present

## 2012-09-08 DIAGNOSIS — K117 Disturbances of salivary secretion: Secondary | ICD-10-CM | POA: Diagnosis not present

## 2012-09-08 DIAGNOSIS — C099 Malignant neoplasm of tonsil, unspecified: Secondary | ICD-10-CM | POA: Diagnosis not present

## 2012-09-08 DIAGNOSIS — R112 Nausea with vomiting, unspecified: Secondary | ICD-10-CM | POA: Diagnosis not present

## 2012-09-09 ENCOUNTER — Ambulatory Visit
Admission: RE | Admit: 2012-09-09 | Discharge: 2012-09-09 | Disposition: A | Discharge status: Home / Self Care | Payer: Medicare Other | Source: Ambulatory Visit | Attending: Radiation Oncology | Admitting: Radiation Oncology

## 2012-09-09 DIAGNOSIS — R070 Pain in throat: Secondary | ICD-10-CM | POA: Diagnosis not present

## 2012-09-09 DIAGNOSIS — E119 Type 2 diabetes mellitus without complications: Secondary | ICD-10-CM | POA: Diagnosis not present

## 2012-09-09 DIAGNOSIS — K117 Disturbances of salivary secretion: Secondary | ICD-10-CM | POA: Diagnosis not present

## 2012-09-09 DIAGNOSIS — R112 Nausea with vomiting, unspecified: Secondary | ICD-10-CM | POA: Diagnosis not present

## 2012-09-09 DIAGNOSIS — C099 Malignant neoplasm of tonsil, unspecified: Secondary | ICD-10-CM | POA: Diagnosis not present

## 2012-09-09 DIAGNOSIS — Z510 Encounter for antineoplastic radiation therapy: Secondary | ICD-10-CM | POA: Diagnosis not present

## 2012-09-12 ENCOUNTER — Other Ambulatory Visit: Payer: Self-pay | Admitting: Radiation Oncology

## 2012-09-12 ENCOUNTER — Ambulatory Visit
Admission: RE | Admit: 2012-09-12 | Discharge: 2012-09-12 | Disposition: A | Discharge status: Home / Self Care | Payer: Medicare Other | Source: Ambulatory Visit | Attending: Radiation Oncology | Admitting: Radiation Oncology

## 2012-09-12 ENCOUNTER — Telehealth: Payer: Self-pay | Admitting: *Deleted

## 2012-09-12 ENCOUNTER — Encounter: Payer: Self-pay | Admitting: Radiation Oncology

## 2012-09-12 ENCOUNTER — Ambulatory Visit: Payer: Medicare Other

## 2012-09-12 VITALS — BP 112/72 | HR 103 | Temp 97.5°F | Resp 20 | Wt 129.9 lb

## 2012-09-12 DIAGNOSIS — C099 Malignant neoplasm of tonsil, unspecified: Secondary | ICD-10-CM

## 2012-09-12 DIAGNOSIS — R112 Nausea with vomiting, unspecified: Secondary | ICD-10-CM | POA: Diagnosis not present

## 2012-09-12 DIAGNOSIS — Z510 Encounter for antineoplastic radiation therapy: Secondary | ICD-10-CM | POA: Diagnosis not present

## 2012-09-12 DIAGNOSIS — R070 Pain in throat: Secondary | ICD-10-CM | POA: Diagnosis not present

## 2012-09-12 DIAGNOSIS — K117 Disturbances of salivary secretion: Secondary | ICD-10-CM | POA: Diagnosis not present

## 2012-09-12 DIAGNOSIS — E119 Type 2 diabetes mellitus without complications: Secondary | ICD-10-CM | POA: Diagnosis not present

## 2012-09-12 MED ORDER — ONDANSETRON HCL 8 MG PO TABS: 8.0000 mg | ORAL_TABLET | Freq: Three times a day (TID) | ORAL | Status: DC | PRN

## 2012-09-12 NOTE — Progress Notes (Signed)
Weekly Management Note:  Site: Right tonsil/neck Current Dose:  4400  cGy Projected Dose: 6600  cGy  Narrative: The patient is seen today for routine under treatment assessment. CBCT/MVCT images/port films were reviewed. The chart was reviewed.   She seen today for evaluation of nausea and vomiting beginning this last Saturday. This did not respond to Compazine. She had gagging but no vomiting on Sunday. She was taking Hycet for pain, but she is not sure of her nausea/vomiting was related to Hycet. She has decreased her by mouth intake but has been able to drink some fluids this morning. She is diabetic and her blood sugar this morning was 104. Her weight is down only 3 pounds over the past 6 days. Physical Examination:  Filed Vitals:   09/12/12 1148  BP: 112/72  Pulse: 103  Temp: 97.5 F (36.4 C)  Resp: 20  .  Weight: 129 lb 14.4 oz (58.922 kg). She does not appear to be ill. On inspection of the oral cavity there is xerostomia with whitish discoloration along the dorsum of the tongue but no evidence for candidiasis.  Impression: Tolerating radiation therapy well, however, she has been having nausea along with vomiting this past Saturday. She may be gagging from thickened secretions. She has failed Compazine, and I will get her started on Zofran, 8 mg Q8 hours when necessary. She was instructed to drink only clear fluids this afternoon, but may advance to chicken broth or chicken noodle soup this evening. Plan: Continue radiation therapy as planned. I'll see her tomorrow if Dr. Pablo Ledger is out. I've a low threshold for giving her IV fluids tomorrow. She'll monitor her blood sugars.

## 2012-09-12 NOTE — Telephone Encounter (Signed)
Patient called stating she"s been nauseated and throwing up again  Since Saturday, compazine isn't helping, informed her she can come before or after tx to be sen by on call MD, has tried gingerale also and didn't help stated paytient, she prefers to have tx and then see MD, called and spoke with [place] on linac 2, she will send pat around to nursing after tx

## 2012-09-12 NOTE — Progress Notes (Addendum)
Weekly rad txs, right tonsil/neck, 20/30 rad txs completed, alert,oriented x3, neck erythema, skin intact, c/o has been nauseated since Saturday evening, andhaving dry heaves, bile coming up,  unable to keep anything down except hycet pain medication, compazine not helping anymore, hasn't  Taken any medication today, took alkaseltzer last night and came back up immediately, wt. Loss 2 lbs since 09/06/12, saw B.Neff on the 13th,  Uses biafine cream to neck bid, pain was a 10 when she woke up now a 5 after hycet pain medication, was using biotene rinses, baking soda rinses,  11:53 AM

## 2012-09-13 ENCOUNTER — Ambulatory Visit: Admission: RE | Admit: 2012-09-13 | Payer: Medicare Other | Source: Ambulatory Visit | Admitting: Radiation Oncology

## 2012-09-13 ENCOUNTER — Ambulatory Visit
Admission: RE | Admit: 2012-09-13 | Discharge: 2012-09-13 | Disposition: A | Discharge status: Home / Self Care | Payer: Medicare Other | Source: Ambulatory Visit | Attending: Radiation Oncology | Admitting: Radiation Oncology

## 2012-09-13 ENCOUNTER — Ambulatory Visit: Payer: Medicare Other

## 2012-09-13 ENCOUNTER — Encounter: Payer: Self-pay | Admitting: Radiation Oncology

## 2012-09-13 ENCOUNTER — Telehealth: Payer: Self-pay | Admitting: *Deleted

## 2012-09-13 VITALS — BP 101/65 | HR 86 | Temp 97.7°F | Resp 20 | Wt 130.3 lb

## 2012-09-13 DIAGNOSIS — R112 Nausea with vomiting, unspecified: Secondary | ICD-10-CM | POA: Diagnosis not present

## 2012-09-13 DIAGNOSIS — C099 Malignant neoplasm of tonsil, unspecified: Secondary | ICD-10-CM

## 2012-09-13 DIAGNOSIS — Z510 Encounter for antineoplastic radiation therapy: Secondary | ICD-10-CM | POA: Diagnosis not present

## 2012-09-13 DIAGNOSIS — E119 Type 2 diabetes mellitus without complications: Secondary | ICD-10-CM | POA: Diagnosis not present

## 2012-09-13 DIAGNOSIS — K117 Disturbances of salivary secretion: Secondary | ICD-10-CM | POA: Diagnosis not present

## 2012-09-13 DIAGNOSIS — R070 Pain in throat: Secondary | ICD-10-CM | POA: Diagnosis not present

## 2012-09-13 MED ORDER — SUCRALFATE 1 GM/10ML PO SUSP: 1.0000 g | Freq: Four times a day (QID) | ORAL | Status: DC

## 2012-09-13 MED ORDER — BIAFINE EX EMUL
CUTANEOUS | Status: DC | PRN
  Administered 2012-09-13: 12:00:00 via TOPICAL

## 2012-09-13 NOTE — Progress Notes (Signed)
The patient is seen again today after starting Zofran yesterday. Her nausea is much improved. She has gained 1 pound since yesterday. Vital signs are much improved. She is more bothered by cervical esophagitis from GERD. She has been taking Prilosec without significant benefit. In the short-term, I will start Carafate slurry to help her with cervical esophagitis exacerbated by ongoing radiation therapy. She'll see Dr. Pablo Ledger later this week for a formal evaluation. I again encouraged her to increase her fluid intake.

## 2012-09-13 NOTE — Telephone Encounter (Signed)
Called patient after having to get prior authorization on [employer] By Dr.Murray, had a fax from [place] needing this, was authorized after speaking to [place] from [place] ,patient is on medicare, informed her she can pick up rx  This afternoon ,patient thanked this RN for the call 2:39 PM

## 2012-09-13 NOTE — Progress Notes (Addendum)
Patient here  Weekly rad txs 21/30 completd, right tonsil, neck,  F/u  From yesterday, alert, oriented x3,  Zofran helped her nausea, ate some organic cream soup last night and drank an ensure and kept it down, patient is drinking water without swallowing difficulty,  still has thick secretions,says its getting thicker and thicker, cannot swallow soups with this mucous" now has heartburn, wants to know what she can take for that," per Dr.Murray not to take anymore alka seltzer" , patient takes  prilosec 20mg  daily isn't working stated patient,  l,patient neck  With  Dry desquamation, erythema, and mild erythema on right mandible  Now, c/o itching now also, switched to biafine cream  With instructions of use of product also Ortho vitals taken, sitting b/p=125/75,p=81,rr=20,T=97.7 Standing b/p=101/65,p=86,, has actually gained 1 lb since yesterday, last time she was nauseated was yesterday morning, per Dr. Pablo Ledger suggestion a scopolamine patch might help, will ask [facility] seeing patient  12:12 PM

## 2012-09-14 ENCOUNTER — Ambulatory Visit
Admission: RE | Admit: 2012-09-14 | Discharge: 2012-09-14 | Disposition: A | Discharge status: Home / Self Care | Payer: Medicare Other | Source: Ambulatory Visit | Attending: Radiation Oncology | Admitting: Radiation Oncology

## 2012-09-14 ENCOUNTER — Ambulatory Visit: Payer: Medicare Other

## 2012-09-14 DIAGNOSIS — R070 Pain in throat: Secondary | ICD-10-CM | POA: Diagnosis not present

## 2012-09-14 DIAGNOSIS — Z510 Encounter for antineoplastic radiation therapy: Secondary | ICD-10-CM | POA: Diagnosis not present

## 2012-09-14 DIAGNOSIS — R112 Nausea with vomiting, unspecified: Secondary | ICD-10-CM | POA: Diagnosis not present

## 2012-09-14 DIAGNOSIS — K117 Disturbances of salivary secretion: Secondary | ICD-10-CM | POA: Diagnosis not present

## 2012-09-14 DIAGNOSIS — C099 Malignant neoplasm of tonsil, unspecified: Secondary | ICD-10-CM | POA: Diagnosis not present

## 2012-09-14 DIAGNOSIS — E119 Type 2 diabetes mellitus without complications: Secondary | ICD-10-CM | POA: Diagnosis not present

## 2012-09-15 ENCOUNTER — Ambulatory Visit: Payer: Medicare Other

## 2012-09-15 ENCOUNTER — Ambulatory Visit
Admission: RE | Admit: 2012-09-15 | Discharge: 2012-09-15 | Disposition: A | Discharge status: Home / Self Care | Payer: Medicare Other | Source: Ambulatory Visit | Attending: Radiation Oncology | Admitting: Radiation Oncology

## 2012-09-15 DIAGNOSIS — E119 Type 2 diabetes mellitus without complications: Secondary | ICD-10-CM | POA: Diagnosis not present

## 2012-09-15 DIAGNOSIS — K117 Disturbances of salivary secretion: Secondary | ICD-10-CM | POA: Diagnosis not present

## 2012-09-15 DIAGNOSIS — R112 Nausea with vomiting, unspecified: Secondary | ICD-10-CM | POA: Diagnosis not present

## 2012-09-15 DIAGNOSIS — C099 Malignant neoplasm of tonsil, unspecified: Secondary | ICD-10-CM | POA: Diagnosis not present

## 2012-09-15 DIAGNOSIS — Z510 Encounter for antineoplastic radiation therapy: Secondary | ICD-10-CM | POA: Diagnosis not present

## 2012-09-15 DIAGNOSIS — R070 Pain in throat: Secondary | ICD-10-CM | POA: Diagnosis not present

## 2012-09-16 ENCOUNTER — Telehealth: Payer: Self-pay | Admitting: *Deleted

## 2012-09-16 ENCOUNTER — Encounter: Payer: Medicare Other | Admitting: Nutrition

## 2012-09-16 ENCOUNTER — Ambulatory Visit
Admission: RE | Admit: 2012-09-16 | Discharge: 2012-09-16 | Disposition: A | Discharge status: Home / Self Care | Payer: Medicare Other | Source: Ambulatory Visit | Attending: Radiation Oncology | Admitting: Radiation Oncology

## 2012-09-16 VITALS — BP 108/68 | HR 70 | Temp 98.5°F | Ht 65.5 in | Wt 130.0 lb

## 2012-09-16 DIAGNOSIS — R112 Nausea with vomiting, unspecified: Secondary | ICD-10-CM | POA: Diagnosis not present

## 2012-09-16 DIAGNOSIS — K117 Disturbances of salivary secretion: Secondary | ICD-10-CM | POA: Diagnosis not present

## 2012-09-16 DIAGNOSIS — C099 Malignant neoplasm of tonsil, unspecified: Secondary | ICD-10-CM | POA: Diagnosis not present

## 2012-09-16 DIAGNOSIS — E119 Type 2 diabetes mellitus without complications: Secondary | ICD-10-CM | POA: Diagnosis not present

## 2012-09-16 DIAGNOSIS — R070 Pain in throat: Secondary | ICD-10-CM | POA: Diagnosis not present

## 2012-09-16 DIAGNOSIS — Z510 Encounter for antineoplastic radiation therapy: Secondary | ICD-10-CM | POA: Diagnosis not present

## 2012-09-16 MED ORDER — RADIAPLEXRX EX GEL
Freq: Once | CUTANEOUS | Status: AC
  Administered 2012-09-16: 13:00:00 via TOPICAL

## 2012-09-16 MED ORDER — BIAFINE EX EMUL
Freq: Two times a day (BID) | CUTANEOUS | Status: DC
  Administered 2012-09-16: 13:00:00 via TOPICAL

## 2012-09-16 MED ORDER — SCOPOLAMINE 1 MG/3DAYS TD PT72: 1.0000 | MEDICATED_PATCH | TRANSDERMAL | Status: DC

## 2012-09-16 MED ORDER — HYDROCODONE-ACETAMINOPHEN 7.5-325 MG/15ML PO SOLN: 15.0000 mL | Freq: Four times a day (QID) | ORAL | Status: DC | PRN

## 2012-09-16 NOTE — Progress Notes (Signed)
nutriition consult appt made, 09/23/12 at 12pm with B.Neff. Will let  Patient know  2:58 PM

## 2012-09-16 NOTE — Telephone Encounter (Signed)
[facility] for pre-authorization, at [phone] with Erasmo Downer,  For scoplamine patches, is authorized through 04/26/13, called and faxed to [place] authorization, spoke with Pharmacist, was informed they can give this to patient but her cost is still $88.00,. Called patient home  And spoke with [facility] and she is aware of the cost , thanked [place] for the call so quickly. 1:21 PM

## 2012-09-16 NOTE — Progress Notes (Signed)
Courtney Bowen is here for weekly under treat visit.  She has had 24/30 fractions to her right tonsil/neck.  She does have pain on swallowing that she is rating at a 8/10.  She is taking carafate and hycet.  She is only able to keep water and a little ensure down.  She is gagging when she swallows and the food comes back out.  She also has nausea and is taking zofran which is helping.  She states her saliva is very thick.  She has been rinsing her mouth with water and baking soda 5-6 times a day.  The skin on her neck is red and she has an area on her right upper neck that has peeled.  She does need radiaplex and biafine cream.  She is doing exercises with her jaw.

## 2012-09-16 NOTE — Progress Notes (Signed)
Weekly Management Note Current Dose:52.8 Gy  Projected Dose:66 Gy   Narrative:  The patient presents for routine under treatment assessment.  CBCT/MVCT images/Port film x-rays were reviewed.  The chart was checked.  Thick secretions are the most bothersome symptom. Not responding to biotene and resulting in nausea and inability to eat. Not taking oxycontin, using hycet qid for pain. Neck skin is irritated and uncomfortable. Has appt with Dory Peru next week.  Physical Findings:  Dry desquamation over entire neck. Mucocitis in oropharynx.   Vitals:  Filed Vitals:   09/16/12 1151  BP: 108/68  Pulse: 70  Temp: 98.5 F (36.9 C)   Weight:  Wt Readings from Last 3 Encounters:  09/16/12 130 lb (58.968 kg)  09/13/12 130 lb 4.8 oz (59.104 kg)  09/12/12 129 lb 14.4 oz (58.922 kg)   Lab Results  Component Value Date   WBC 6.6 09/28/2010   HGB 8.8 RESULT REPEATED AND VERIFIED DELTA CHECK NOTED* 09/28/2010   HCT 24.1* 09/28/2010   MCV 84.9 09/28/2010   PLT 239 09/28/2010   Lab Results  Component Value Date   CREATININE 1.0 08/08/2012   BUN 7.5 08/08/2012   NA 127* 09/28/2010   K 4.4 09/28/2010   CL 94* 09/28/2010   CO2 22 09/28/2010     Impression:  The patient is tolerating radiation.  Plan:  Continue treatment as planned. Weight stable. Discussed role of oxycontin vs. Prn medications. Will try scopalamine patch for secretions. Refill on hycet given.

## 2012-09-20 ENCOUNTER — Ambulatory Visit
Admission: RE | Admit: 2012-09-20 | Discharge: 2012-09-20 | Disposition: A | Discharge status: Home / Self Care | Payer: Medicare Other | Source: Ambulatory Visit | Attending: Radiation Oncology | Admitting: Radiation Oncology

## 2012-09-20 ENCOUNTER — Ambulatory Visit: Payer: Medicare Other

## 2012-09-20 VITALS — BP 116/62 | HR 68 | Temp 97.9°F | Wt 130.0 lb

## 2012-09-20 DIAGNOSIS — K117 Disturbances of salivary secretion: Secondary | ICD-10-CM | POA: Diagnosis not present

## 2012-09-20 DIAGNOSIS — R112 Nausea with vomiting, unspecified: Secondary | ICD-10-CM | POA: Diagnosis not present

## 2012-09-20 DIAGNOSIS — R070 Pain in throat: Secondary | ICD-10-CM | POA: Diagnosis not present

## 2012-09-20 DIAGNOSIS — Z510 Encounter for antineoplastic radiation therapy: Secondary | ICD-10-CM | POA: Diagnosis not present

## 2012-09-20 DIAGNOSIS — C801 Malignant (primary) neoplasm, unspecified: Secondary | ICD-10-CM

## 2012-09-20 DIAGNOSIS — C099 Malignant neoplasm of tonsil, unspecified: Secondary | ICD-10-CM | POA: Diagnosis not present

## 2012-09-20 DIAGNOSIS — E119 Type 2 diabetes mellitus without complications: Secondary | ICD-10-CM | POA: Diagnosis not present

## 2012-09-20 MED ORDER — LIDOCAINE VISCOUS 2 % MT SOLN: OROMUCOSAL | Status: DC

## 2012-09-20 MED ORDER — OXYCODONE HCL 10 MG PO TB12: 10.0000 mg | ORAL_TABLET | Freq: Two times a day (BID) | ORAL | Status: DC

## 2012-09-20 NOTE — Progress Notes (Signed)
Weekly Management Note Current Dose:55 Gy  Projected Dose:66 Gy   Narrative:  The patient presents for routine under treatment assessment.  CBCT/MVCT images/Port film x-rays were reviewed.  The chart was checked. Secretions continue. oxycontin bid is helping. Not able to swallow pills with hycet only. Not taking magic mouthwash. Sister accompanies her.   Physical Findings:  Dry desquamation of her neck. Mucocitis in oropharynx. Redundant taste buds anterior tongue.   Vitals:  Filed Vitals:   09/20/12 1223  BP: 116/62  Pulse: 68  Temp: 97.9 F (36.6 C)   Weight:  Wt Readings from Last 3 Encounters:  09/20/12 130 lb (58.968 kg)  09/16/12 130 lb (58.968 kg)  09/13/12 130 lb 4.8 oz (59.104 kg)   Lab Results  Component Value Date   WBC 6.6 09/28/2010   HGB 8.8 RESULT REPEATED AND VERIFIED DELTA CHECK NOTED* 09/28/2010   HCT 24.1* 09/28/2010   MCV 84.9 09/28/2010   PLT 239 09/28/2010   Lab Results  Component Value Date   CREATININE 1.0 08/08/2012   BUN 7.5 08/08/2012   NA 127* 09/28/2010   K 4.4 09/28/2010   CL 94* 09/28/2010   CO2 22 09/28/2010     Impression:  The patient is tolerating radiation. Weight stable  Plan:  Continue treatment as planned. REfilled oxycontin and gave script for viscous lidocaine and instructions on its use.

## 2012-09-20 NOTE — Progress Notes (Signed)
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

## 2012-09-21 ENCOUNTER — Ambulatory Visit
Admission: RE | Admit: 2012-09-21 | Discharge: 2012-09-21 | Disposition: A | Discharge status: Home / Self Care | Payer: Medicare Other | Source: Ambulatory Visit | Attending: Radiation Oncology | Admitting: Radiation Oncology

## 2012-09-21 DIAGNOSIS — R112 Nausea with vomiting, unspecified: Secondary | ICD-10-CM | POA: Diagnosis not present

## 2012-09-21 DIAGNOSIS — K117 Disturbances of salivary secretion: Secondary | ICD-10-CM | POA: Diagnosis not present

## 2012-09-21 DIAGNOSIS — Z510 Encounter for antineoplastic radiation therapy: Secondary | ICD-10-CM | POA: Diagnosis not present

## 2012-09-21 DIAGNOSIS — E119 Type 2 diabetes mellitus without complications: Secondary | ICD-10-CM | POA: Diagnosis not present

## 2012-09-21 DIAGNOSIS — C099 Malignant neoplasm of tonsil, unspecified: Secondary | ICD-10-CM | POA: Diagnosis not present

## 2012-09-21 DIAGNOSIS — R070 Pain in throat: Secondary | ICD-10-CM | POA: Diagnosis not present

## 2012-09-22 ENCOUNTER — Ambulatory Visit: Payer: Medicare Other

## 2012-09-22 ENCOUNTER — Ambulatory Visit
Admission: RE | Admit: 2012-09-22 | Discharge: 2012-09-22 | Disposition: A | Discharge status: Home / Self Care | Payer: Medicare Other | Source: Ambulatory Visit | Attending: Radiation Oncology | Admitting: Radiation Oncology

## 2012-09-22 DIAGNOSIS — Z510 Encounter for antineoplastic radiation therapy: Secondary | ICD-10-CM | POA: Diagnosis not present

## 2012-09-22 DIAGNOSIS — C099 Malignant neoplasm of tonsil, unspecified: Secondary | ICD-10-CM | POA: Diagnosis not present

## 2012-09-22 DIAGNOSIS — R070 Pain in throat: Secondary | ICD-10-CM | POA: Diagnosis not present

## 2012-09-22 DIAGNOSIS — E119 Type 2 diabetes mellitus without complications: Secondary | ICD-10-CM | POA: Diagnosis not present

## 2012-09-22 DIAGNOSIS — R112 Nausea with vomiting, unspecified: Secondary | ICD-10-CM | POA: Diagnosis not present

## 2012-09-22 DIAGNOSIS — K117 Disturbances of salivary secretion: Secondary | ICD-10-CM | POA: Diagnosis not present

## 2012-09-23 ENCOUNTER — Ambulatory Visit: Payer: Medicare Other | Admitting: Nutrition

## 2012-09-23 ENCOUNTER — Ambulatory Visit
Admission: RE | Admit: 2012-09-23 | Discharge: 2012-09-23 | Disposition: A | Discharge status: Home / Self Care | Payer: Medicare Other | Source: Ambulatory Visit | Attending: Radiation Oncology | Admitting: Radiation Oncology

## 2012-09-23 DIAGNOSIS — Z510 Encounter for antineoplastic radiation therapy: Secondary | ICD-10-CM | POA: Diagnosis not present

## 2012-09-23 DIAGNOSIS — E119 Type 2 diabetes mellitus without complications: Secondary | ICD-10-CM | POA: Diagnosis not present

## 2012-09-23 DIAGNOSIS — R112 Nausea with vomiting, unspecified: Secondary | ICD-10-CM | POA: Diagnosis not present

## 2012-09-23 DIAGNOSIS — R070 Pain in throat: Secondary | ICD-10-CM | POA: Diagnosis not present

## 2012-09-23 DIAGNOSIS — K117 Disturbances of salivary secretion: Secondary | ICD-10-CM | POA: Diagnosis not present

## 2012-09-23 DIAGNOSIS — C099 Malignant neoplasm of tonsil, unspecified: Secondary | ICD-10-CM | POA: Diagnosis not present

## 2012-09-23 NOTE — Progress Notes (Signed)
Patient presents to nutrition followup with her sister. Patient's weight decreased to 127.8 pounds documented may 30th which is down from approximately 130 pounds the past 2 weeks. Patient states she can only tolerate one Ensure Plus a day mixed into 2 milkshakes. She is drinking 2-16 ounce bottles of water a day.  She's not eating any foods. She has had some vomiting secondary to thick mucus. She continues salt water rinses but is not consistent. Patient reports she had an episode of being dizzy while she was walking off the elevator today. Her sister reports patient's blood pressure was lower than usual in the past. Patient's sister is concerned patient may be dehydrated.  Nutrition diagnosis: Food and nutrition related knowledge deficit continues.  Intervention: Patient was educated to attempt to increase Ensure Plus to a minimum of 2 daily with an eventual goal to increase to 3 daily. She is to rinse her mouth out prior to trying to consume liquids. I provided her with samples of [place] chicken soup for additional protein and fluids. I've encouraged her to increase water to a minimum of 3-16 ounce bottles daily. I have referred patient to nurse to check her vitals and evaluate for dehydration.  Monitoring, evaluation, goals: Patient will tolerate Ensure Plus twice a day with 3-16 ounce bottles of water a day plus one cup of [place] chicken soup. If tolerated patient will gradually increase total oral nutrition to meet minimum needs to minimize further weight loss, promote healing and minimize risk for dehydration.   Next visit: I will continue to followup with patient and work to increase oral intake.

## 2012-09-23 NOTE — Progress Notes (Addendum)
Patient brought to nursing by Dory Peru RD to have orthostatic vitals after having dizziness.Orthostatic vitals ok.Patient has been getting down approximately 2 bottles of water but no other nutrition.Doesn't want iv fluids or feeding tube.Vitals shown to Dr.Wentworth, ok for patient to be go home.Patient promises to try harder over the week-end to push po intake and come to nursing on Monday for reassessment of orthostatic vitals.Patient not on scopalamine patch as too expensive, co-pay $88.00.

## 2012-09-26 ENCOUNTER — Ambulatory Visit: Payer: Medicare Other

## 2012-09-26 ENCOUNTER — Ambulatory Visit
Admission: RE | Admit: 2012-09-26 | Discharge: 2012-09-26 | Disposition: A | Discharge status: Home / Self Care | Payer: Medicare Other | Source: Ambulatory Visit | Attending: Radiation Oncology | Admitting: Radiation Oncology

## 2012-09-26 DIAGNOSIS — R070 Pain in throat: Secondary | ICD-10-CM | POA: Diagnosis not present

## 2012-09-26 DIAGNOSIS — C099 Malignant neoplasm of tonsil, unspecified: Secondary | ICD-10-CM | POA: Diagnosis not present

## 2012-09-26 DIAGNOSIS — E119 Type 2 diabetes mellitus without complications: Secondary | ICD-10-CM | POA: Diagnosis not present

## 2012-09-26 DIAGNOSIS — R112 Nausea with vomiting, unspecified: Secondary | ICD-10-CM | POA: Diagnosis not present

## 2012-09-26 DIAGNOSIS — Z510 Encounter for antineoplastic radiation therapy: Secondary | ICD-10-CM | POA: Diagnosis not present

## 2012-09-26 DIAGNOSIS — K117 Disturbances of salivary secretion: Secondary | ICD-10-CM | POA: Diagnosis not present

## 2012-09-27 ENCOUNTER — Ambulatory Visit
Admission: RE | Admit: 2012-09-27 | Discharge: 2012-09-27 | Disposition: A | Discharge status: Home / Self Care | Payer: Medicare Other | Source: Ambulatory Visit | Attending: Radiation Oncology | Admitting: Radiation Oncology

## 2012-09-27 ENCOUNTER — Ambulatory Visit: Payer: Medicare Other | Admitting: Nutrition

## 2012-09-27 ENCOUNTER — Encounter: Payer: Self-pay | Admitting: Radiation Oncology

## 2012-09-27 ENCOUNTER — Ambulatory Visit: Payer: Medicare Other

## 2012-09-27 ENCOUNTER — Telehealth: Payer: Self-pay | Admitting: *Deleted

## 2012-09-27 VITALS — BP 115/64 | HR 65 | Temp 98.1°F | Ht 65.5 in | Wt 125.9 lb

## 2012-09-27 DIAGNOSIS — K117 Disturbances of salivary secretion: Secondary | ICD-10-CM | POA: Diagnosis not present

## 2012-09-27 DIAGNOSIS — Z510 Encounter for antineoplastic radiation therapy: Secondary | ICD-10-CM | POA: Diagnosis not present

## 2012-09-27 DIAGNOSIS — R112 Nausea with vomiting, unspecified: Secondary | ICD-10-CM | POA: Diagnosis not present

## 2012-09-27 DIAGNOSIS — E119 Type 2 diabetes mellitus without complications: Secondary | ICD-10-CM | POA: Diagnosis not present

## 2012-09-27 DIAGNOSIS — C099 Malignant neoplasm of tonsil, unspecified: Secondary | ICD-10-CM

## 2012-09-27 DIAGNOSIS — R070 Pain in throat: Secondary | ICD-10-CM | POA: Diagnosis not present

## 2012-09-27 MED ORDER — ONDANSETRON HCL 8 MG PO TABS: 8.0000 mg | ORAL_TABLET | Freq: Three times a day (TID) | ORAL | Status: DC | PRN

## 2012-09-27 MED ORDER — MORPHINE SULFATE 15 MG PO TABS: 15.0000 mg | ORAL_TABLET | ORAL | Status: DC | PRN

## 2012-09-27 MED ORDER — HYDROCODONE-ACETAMINOPHEN 7.5-325 MG/15ML PO SOLN: 15.0000 mL | Freq: Four times a day (QID) | ORAL | Status: DC | PRN

## 2012-09-27 NOTE — Progress Notes (Signed)
Patient reports that she has tolerated Un[place] chicken soup twice daily. She has been unable to tolerate "milk based" oral nutrition supplements. Her weight continues to decline and was documented as 125 pounds on June 3.  Nutrition diagnosis: Food and nutrition related knowledge deficit continues.  Intervention: I have provided patient with additional samples of Un[place] chicken soup and encouraged her to continue to consume twice a day. I've also provided her with some options for ways to get additional calories and explained the importance of, not only extra protein, but enough calories for her body to promote weight maintenance of lean body tissue. I've encouraged patient to attempt to consume some protein-based soups.  Monitoring, evaluation, goals: Patient will work to increase oral intake and continue Unjury chicken soup as tolerated to minimize further weight loss.  Next visit: I will continue to work with patient as needed.

## 2012-09-27 NOTE — Progress Notes (Addendum)
Courtney Bowen here with her sister for her final treatment visit.  She has had 30 fractions to her r tonsil and neck.  She does have pain with swallowing that she is rating at a 9/10.  She is using the hycet and states that it helps for a few minutes.  She does needs a refill for it as well as Zofran and xylocaine.  She does have thick saliva and is using peroxide solution.  She says it is like chewing gum and nothing is helping it.  She does have occasional nausea.  She is doing her jaw excesses.  The only think she is eating is two of unjury supplements given to her by Dory Peru a day.  She has lost 2 lbs since last week.  She states her taste buds have changed but she can still taste certain foods like ice cream.  Her skin on her neck is peeling.  She does have a scabbed area on her right neck.  She is using radiaplex and biafine.  Late entry: Left a message for Ernestene Kiel.  Patient was asking about how to get the [place] because her pharmacy would not have them until Friday.

## 2012-09-27 NOTE — Telephone Encounter (Signed)
error 

## 2012-09-27 NOTE — Addendum Note (Signed)
Encounter addended by: Jacqulyn Liner, RN on: 09/27/2012  3:40 PM<BR>     Documentation filed: Notes Section

## 2012-09-27 NOTE — Progress Notes (Signed)
Weekly Management Note Current Dose:66 Gy  Projected Dose: 66 Gy   Narrative:  The patient presents for routine under treatment assessment.  CBCT/MVCT images/Port film x-rays were reviewed.  The chart was checked. Thickened secretions. Only getting 2 protein shakes down per day. Viscous lidocaine helping with pills hycet helping but doesn't last long enough. Needs something quick acting in AM. Trying to drink water and chicken soup. zofran controlling nausea. Needs refills.  Physical Findings:  Dry desquamation over bilateral neck. Mucocitis in oral cavity/oropharynx.   Vitals:  Filed Vitals:   09/27/12 1215  BP: 115/64  Pulse: 65  Temp: 98.1 F (36.7 C)   Weight:  Wt Readings from Last 3 Encounters:  09/27/12 125 lb 14.4 oz (57.108 kg)  09/23/12 127 lb 12.8 oz (57.97 kg)  09/20/12 130 lb (58.968 kg)   Lab Results  Component Value Date   WBC 6.6 09/28/2010   HGB 8.8 RESULT REPEATED AND VERIFIED DELTA CHECK NOTED* 09/28/2010   HCT 24.1* 09/28/2010   MCV 84.9 09/28/2010   PLT 239 09/28/2010   Lab Results  Component Value Date   CREATININE 1.0 08/08/2012   BUN 7.5 08/08/2012   NA 127* 09/28/2010   K 4.4 09/28/2010   CL 94* 09/28/2010   CO2 22 09/28/2010     Impression:  Finishes RT today  Plan:  Follow up in 2 weeks. Encouraged pos. Refills given. Call with questions. Continue biafene.

## 2012-09-28 ENCOUNTER — Ambulatory Visit: Payer: Medicare Other

## 2012-09-28 NOTE — Progress Notes (Signed)
  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 Canipe was treated with radiation alone to her tonsillar tumor. The primary was treated at 2.2 Gy per fraction x 33 fractions to 66 Gy.  High risk lymph nodes received 60 Gy and standard risk lymph nodes receive 54 Gy. Narrative: The patient tolerated radiation treatment relatively well.   She lost 9 pounds during treatment and had dry desquamation of the neck. Her sister was able to come and stay with her for the last 2 weeks of treament. She struggled with thick secretions and odynophagia throughout her treatment.   Plan: The patient has completed radiation treatment. The patient will return to radiation oncology clinic for routine followup in one month. I advised them to call or return sooner if they have any questions or concerns related to their recovery or treatment.  ------------------------------------------------  [name], MD

## 2012-09-29 ENCOUNTER — Ambulatory Visit: Payer: Medicare Other | Admitting: Radiation Oncology

## 2012-09-29 ENCOUNTER — Ambulatory Visit: Payer: Medicare Other

## 2012-09-30 ENCOUNTER — Ambulatory Visit: Payer: Medicare Other

## 2012-10-04 ENCOUNTER — Telehealth: Payer: Self-pay | Admitting: *Deleted

## 2012-10-04 NOTE — Telephone Encounter (Signed)
Called patient ,throat still very very sore, can't swallow any foods, chicken soup and broth ok, using ensure sips ,tired of milk products, suggested creamed soups without the chunks, apple sauce, , will see her back this Tuesday, can call if things don't improve, she sounds better ,rthanked me for calling and checking on her 9:27 AM

## 2012-10-05 ENCOUNTER — Encounter: Payer: Self-pay | Admitting: Radiation Oncology

## 2012-10-11 ENCOUNTER — Ambulatory Visit
Admission: RE | Admit: 2012-10-11 | Discharge: 2012-10-11 | Disposition: A | Discharge status: Home / Self Care | Payer: Medicare Other | Source: Ambulatory Visit | Attending: Radiation Oncology | Admitting: Radiation Oncology

## 2012-10-11 ENCOUNTER — Telehealth: Payer: Self-pay | Admitting: *Deleted

## 2012-10-11 ENCOUNTER — Encounter: Payer: Self-pay | Admitting: Radiation Oncology

## 2012-10-11 VITALS — BP 138/75 | HR 70 | Temp 98.4°F | Resp 20 | Wt 122.8 lb

## 2012-10-11 DIAGNOSIS — C099 Malignant neoplasm of tonsil, unspecified: Secondary | ICD-10-CM

## 2012-10-11 HISTORY — DX: Personal history of irradiation: Z92.3

## 2012-10-11 NOTE — Progress Notes (Signed)
Follow Up Note  Narrative:  The patient returns for followup today. She completed 66 gray of radiation in 2 weeks ago. Her skin is healing well. She is eating better. Her mucous is at a 5/10. She still gets thick secretions and feelings of nausea after she takes a few bites of food. She is really only drinking Ensure and doing very minimal by mouth intake otherwise. She has been treating the correct him. She has some insomnia. She is taking no pain medications. Her sister leaves today. She is taking care of her teeth. She has not restarted her swallowing exercises.  Physical Findings:  She is a pleasant female in no distress sitting comfortably on examining table. Her skin is completely healed. She has a small area of hyperpigmentation over the right neck. There are no palpable lymph nodes in the bilateral neck or submandibular fossa. She has some very mild mucositis in the posterior right oropharynx. There is no evidence of disease.  Vitals:  Filed Vitals:   10/11/12 0812  BP: 138/75  Pulse: 70  Temp: 98.4 F (36.9 C)  Resp: 20   Weight:  Wt Readings from Last 3 Encounters:  10/11/12 122 lb 12.8 oz (55.702 kg)  09/27/12 125 lb 14.4 oz (57.108 kg)  09/23/12 127 lb 12.8 oz (57.97 kg)   Lab Results  Component Value Date   WBC 6.6 09/28/2010   HGB 8.8 RESULT REPEATED AND VERIFIED DELTA CHECK NOTED* 09/28/2010   HCT 24.1* 09/28/2010   MCV 84.9 09/28/2010   PLT 239 09/28/2010   Lab Results  Component Value Date   CREATININE 1.0 08/08/2012   BUN 7.5 08/08/2012   NA 127* 09/28/2010   K 4.4 09/28/2010   CL 94* 09/28/2010   CO2 22 09/28/2010     Impression:  The patient is recovering from radiation well.  Plan:  I will see her back in a month with a CT scan at that time. I encouraged her to continue doing small meals throughout the day. Her weight loss has definitely decreased but she still is losing weight. We discussed protein intake. I encouraged her to restart her swallowing exercises and followup  with speech pathology if she had any questions regarding this.

## 2012-10-11 NOTE — Telephone Encounter (Signed)
Called patient to inform of test, spoke with patient and she is aware of this test. 

## 2012-10-11 NOTE — Progress Notes (Signed)
Follow up s/p rad txs squamous cell tonsil 08/16/12-09/27/12, eating bites bacon, toast,  After a few bites mucous  Comes in and patient has difficulty swallowing then, drinking water,ensure and high protein chicken flavored chicken broth,   skin on neck healed, has hacking cough with nicorette gum , drinks 3 cans ensure plus daily , sister support  Patient feels so much better  Now, but has trouble sleeping recently , fatigue still \ 8:18 AM

## 2012-10-12 ENCOUNTER — Telehealth: Payer: Self-pay | Admitting: *Deleted

## 2012-10-12 NOTE — Telephone Encounter (Signed)
CALLED PATIENT TO INFORM OF FU VISIT ON 11-17-12 AT 4:00 PM, SPOKE WITH PATIENT AND SHE IS AWARE OF THIS APPT.

## 2012-10-12 NOTE — Telephone Encounter (Signed)
Returned patient call , she c/o throwing up today after drinking 1 can ensure, and had upset stomach, hasn't eaten any bites of food, recenly threw up up  Some yellow bile, hasn't taken any compazine for nausea, or stomach upset, suggested clear broths, (chicken), take compazine,for [place]a, if continues throwing up go to urgent care facility , call us tomorrow if not any better, MD out of officce Thursday will be back Friday , can reschedule f/u if needed, patient thanked me was just scared about the yellow bile, , will f/u with her tomorrow 3:54 PM

## 2012-10-26 ENCOUNTER — Telehealth: Payer: Self-pay | Admitting: Nutrition

## 2012-10-26 DIAGNOSIS — H25019 Cortical age-related cataract, unspecified eye: Secondary | ICD-10-CM | POA: Diagnosis not present

## 2012-10-26 DIAGNOSIS — H2510 Age-related nuclear cataract, unspecified eye: Secondary | ICD-10-CM | POA: Diagnosis not present

## 2012-10-26 DIAGNOSIS — E119 Type 2 diabetes mellitus without complications: Secondary | ICD-10-CM | POA: Diagnosis not present

## 2012-10-26 NOTE — Telephone Encounter (Signed)
I spoke with patient on the telephone.  She states she is trying to eat more solid food.  She has decreased Ensure Plus to twice a day instead of 3 times a day.  She states her weight is stable at approximately 121 pounds.  Nutrition diagnosis: Food and nutrition related knowledge deficit improved.  Intervention: I have provided support and encouragement to patient to continue to increase oral intake of solid foods.  I have encouraged her to increase Ensure Plus back to 3 times daily to provide some additional calories and protein.  She will continue to drink Unjury chicken soup as needed.  Monitoring, evaluation, goals: Patient will continue to work to increase oral intake to promote weight stabilization.  Next visit: Patient will contact me with questions or concerns.

## 2012-11-09 ENCOUNTER — Ambulatory Visit
Admission: RE | Admit: 2012-11-09 | Discharge: 2012-11-09 | Disposition: A | Discharge status: Home / Self Care | Payer: Medicare Other | Source: Ambulatory Visit | Attending: Radiation Oncology | Admitting: Radiation Oncology

## 2012-11-09 DIAGNOSIS — C099 Malignant neoplasm of tonsil, unspecified: Secondary | ICD-10-CM | POA: Insufficient documentation

## 2012-11-10 ENCOUNTER — Ambulatory Visit (HOSPITAL_COMMUNITY)
Admission: RE | Admit: 2012-11-10 | Discharge: 2012-11-10 | Disposition: A | Discharge status: Home / Self Care | Payer: Medicare Other | Source: Ambulatory Visit | Attending: Radiation Oncology | Admitting: Radiation Oncology

## 2012-11-10 DIAGNOSIS — R918 Other nonspecific abnormal finding of lung field: Secondary | ICD-10-CM | POA: Diagnosis not present

## 2012-11-10 DIAGNOSIS — E119 Type 2 diabetes mellitus without complications: Secondary | ICD-10-CM | POA: Insufficient documentation

## 2012-11-10 DIAGNOSIS — Z923 Personal history of irradiation: Secondary | ICD-10-CM | POA: Diagnosis not present

## 2012-11-10 DIAGNOSIS — R220 Localized swelling, mass and lump, head: Secondary | ICD-10-CM | POA: Diagnosis not present

## 2012-11-10 DIAGNOSIS — J438 Other emphysema: Secondary | ICD-10-CM | POA: Insufficient documentation

## 2012-11-10 DIAGNOSIS — R221 Localized swelling, mass and lump, neck: Secondary | ICD-10-CM | POA: Diagnosis not present

## 2012-11-10 DIAGNOSIS — C099 Malignant neoplasm of tonsil, unspecified: Secondary | ICD-10-CM | POA: Diagnosis not present

## 2012-11-10 DIAGNOSIS — K115 Sialolithiasis: Secondary | ICD-10-CM | POA: Insufficient documentation

## 2012-11-10 MED ORDER — IOHEXOL 300 MG/ML  SOLN
100.0000 mL | Freq: Once | INTRAMUSCULAR | Status: AC | PRN
  Administered 2012-11-10: 100 mL via INTRAVENOUS

## 2012-11-17 ENCOUNTER — Ambulatory Visit: Admission: RE | Admit: 2012-11-17 | Payer: Medicare Other | Source: Ambulatory Visit | Admitting: Radiation Oncology

## 2012-11-17 ENCOUNTER — Telehealth: Payer: Self-pay | Admitting: *Deleted

## 2012-11-17 ENCOUNTER — Other Ambulatory Visit: Payer: Self-pay | Admitting: Radiation Oncology

## 2012-11-17 DIAGNOSIS — C099 Malignant neoplasm of tonsil, unspecified: Secondary | ICD-10-CM

## 2012-11-17 NOTE — Telephone Encounter (Signed)
CALLED PATIENT TO INFORM OF TEST AND FU VISIT, SPOKE WITH PATIENT AND SHE IS AWARE OF THESE APPTS.

## 2012-11-17 NOTE — Telephone Encounter (Signed)
Spoke with patient and per Dr.Wentworth  After she spoke with radiaologist stone is still there, and that patient can call her ENT to see if she can be seen and if there is anything they can do, but feels it isn't cancer,her appt Pet scan 12/29/12 adn follow up appt 12/26/12, patient said to thank Dr.Wentworth and she would see her in September 3:03 PM

## 2012-11-29 DIAGNOSIS — Z85819 Personal history of malignant neoplasm of unspecified site of lip, oral cavity, and pharynx: Secondary | ICD-10-CM | POA: Diagnosis not present

## 2012-11-29 DIAGNOSIS — K115 Sialolithiasis: Secondary | ICD-10-CM | POA: Diagnosis not present

## 2012-12-16 DIAGNOSIS — Z23 Encounter for immunization: Secondary | ICD-10-CM | POA: Diagnosis not present

## 2012-12-16 DIAGNOSIS — Z79899 Other long term (current) drug therapy: Secondary | ICD-10-CM | POA: Diagnosis not present

## 2012-12-16 DIAGNOSIS — Z0000 Encounter for general adult medical examination without abnormal findings: Secondary | ICD-10-CM | POA: Diagnosis not present

## 2012-12-16 DIAGNOSIS — J309 Allergic rhinitis, unspecified: Secondary | ICD-10-CM | POA: Diagnosis not present

## 2012-12-16 DIAGNOSIS — E7800 Pure hypercholesterolemia, unspecified: Secondary | ICD-10-CM | POA: Diagnosis not present

## 2012-12-16 DIAGNOSIS — E119 Type 2 diabetes mellitus without complications: Secondary | ICD-10-CM | POA: Diagnosis not present

## 2012-12-19 DIAGNOSIS — Z79899 Other long term (current) drug therapy: Secondary | ICD-10-CM | POA: Diagnosis not present

## 2012-12-19 DIAGNOSIS — E119 Type 2 diabetes mellitus without complications: Secondary | ICD-10-CM | POA: Diagnosis not present

## 2012-12-19 DIAGNOSIS — E7800 Pure hypercholesterolemia, unspecified: Secondary | ICD-10-CM | POA: Diagnosis not present

## 2012-12-29 ENCOUNTER — Encounter (HOSPITAL_COMMUNITY): Admission: RE | Admit: 2012-12-29 | Payer: Medicare Other | Source: Ambulatory Visit

## 2013-01-02 ENCOUNTER — Telehealth: Payer: Self-pay | Admitting: *Deleted

## 2013-01-02 ENCOUNTER — Telehealth: Payer: Self-pay | Admitting: Oncology

## 2013-01-02 ENCOUNTER — Encounter (HOSPITAL_COMMUNITY)
Admission: RE | Admit: 2013-01-02 | Discharge: 2013-01-02 | Disposition: A | Discharge status: Home / Self Care | Payer: Medicare Other | Source: Ambulatory Visit | Attending: Radiation Oncology | Admitting: Radiation Oncology

## 2013-01-02 DIAGNOSIS — C099 Malignant neoplasm of tonsil, unspecified: Secondary | ICD-10-CM

## 2013-01-02 NOTE — Telephone Encounter (Signed)
Received a call from [facility].  She was very upset about her experience for her PET scan today.  She said she was stuck 5 times to try to get an IV.  She said that an ICU nurse and a nurse from the IV team were unable to start an IV.  She said the nurses were talking over her and mentioned that she needed a PICC line and that she was a hard stick possibly due to radiation.  She is very upset that she was stuck 5 times and was not able to have the PET scan done and is very frustrated.  The PET has been rescheduled for 01/09/2013.  She still wants to keep her follow up appointment with Dr. Pablo Ledger on 01/05/2013 to discuss how the next PET is going to be done.  She would like Dr. Pablo Ledger or her nurse to call her tomorrow to discuss this.

## 2013-01-02 NOTE — Telephone Encounter (Signed)
Eulas Post called from Radiology , they were not able to get an IV for patient, they even called IV team, they have rescheduled her Pet for 01/09/13 at 1pm, called Rhonda flynt, asked her to reschedule her follow up with Dr.Wentworth  After Pet, and pleas call patient, per Suanne Marker stated she would 3:23 PM

## 2013-01-03 ENCOUNTER — Telehealth: Payer: Self-pay | Admitting: *Deleted

## 2013-01-03 NOTE — Telephone Encounter (Signed)
Thayer Headings- Can you please call radiology and see what needs to be done for this patient and please let them know her experience and concerns.  Please ask if she needs to come to chemo the day before to have an IV done.  Please also let her know that her veins have in no way been damaged by radiation.  Please ask for the supervisor's name in radiology to give to Courtney Bowen if she would like to speak to that person.    Also, per Karen's note, she wanted to see me prior to the PET scan to discuss this.  Please ask her if she would like to see me before her PET scan or after before Rhonda reschedules her.   SW

## 2013-01-03 NOTE — Telephone Encounter (Signed)
Patient called asking if she  still had her appt with Dr.Wentworth this Thursday, 01/05/13, I told her yes, and that I had spoken with Maurine Simmering at Radiology , he expressed his apology about the conversation from the nurses that upset her, and he would call her if she wanted him to , Patient did sayshe refused her hand to be Iv accessed, I let patient know MD was aware of her concerns and experience and that we wold see maybe getting a  Med onc nurse to start IV the day of her scheduled Pet on 01/09/13 before going to radiaology,"she was open to this and stated she felt much better after talking with me, will inform MS, and will see her this Thursday 9:34 AM

## 2013-01-05 ENCOUNTER — Ambulatory Visit: Payer: Medicare Other | Admitting: Radiation Oncology

## 2013-01-05 ENCOUNTER — Ambulatory Visit
Admission: RE | Admit: 2013-01-05 | Discharge: 2013-01-05 | Disposition: A | Discharge status: Home / Self Care | Payer: Medicare Other | Source: Ambulatory Visit | Attending: Radiation Oncology | Admitting: Radiation Oncology

## 2013-01-05 VITALS — BP 147/93 | HR 114 | Temp 98.1°F | Wt 112.8 lb

## 2013-01-05 DIAGNOSIS — C099 Malignant neoplasm of tonsil, unspecified: Secondary | ICD-10-CM

## 2013-01-05 MED ORDER — PILOCARPINE HCL 5 MG PO TABS: 5.0000 mg | ORAL_TABLET | Freq: Three times a day (TID) | ORAL | Status: DC

## 2013-01-05 NOTE — Progress Notes (Signed)
Patient here for routien follow up completion of radiation of tonsillar cancer in June 2014.Scheduled for PET on 01/09/13. Has issues with previous pet as unable to get iv started.Wants to discuss "what is sense to have PET , if has to go through the same thing."

## 2013-01-05 NOTE — Progress Notes (Signed)
   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  She attempted a PET scan last week but was a difficult stick. We have not scheduled for next Monday. She continues to have difficulty with food turning to dust in her mouth. She has very limited food and is no longer drinking boost or Ensure. She's not having much in the way of protein in mostly is eating canned corn and vegetables because is what is good her. Again no pain medications. She denies any new lumps or bumps or shortness of breath.  Allergies: No Known Allergies  Medications:  Current Outpatient Prescriptions  Medication Sig Dispense Refill  . atorvastatin (LIPITOR) 10 MG tablet Take 10 mg by mouth daily.      . cholecalciferol (VITAMIN D) 400 UNITS TABS Take by mouth.      . metFORMIN (GLUCOPHAGE) 1000 MG tablet Take 1,000 mg by mouth 2 (two) times daily with a meal. Increased on 08/26/12 to 2000 mg bid      . polyethylene glycol (MIRALAX / GLYCOLAX) packet Take 17 g by mouth daily.      Marland Kitchen omeprazole (PRILOSEC) 10 MG capsule Take 10 mg by mouth daily.      . ondansetron (ZOFRAN) 8 MG tablet Take 1 tablet (8 mg total) by mouth every 8 (eight) hours as needed for nausea.  30 tablet  1   No current facility-administered medications for this encounter.    Physical Exam:  Filed Vitals:   01/05/13 1328  BP: 147/93  Pulse: 114  Temp: 98.1 F (36.7 C)   ECoG performance status 1 she is a pleasant female who appears thin sitting on the exam room table. Her weight is 112 pounds which is down 10 pounds from her last weight on June 17. She has no palpable cervical or suprapubic or adenopathy. She still has  hyperpigmentation of her neck. Her oral mucosa is tachycardia. She has no palpable or visible signs of tumor recurrence in the right or left tonsillar fossa or base of tongue on indirect mirror examination. Her right tonsil and base of tongue are sort of tacked together  IMPRESSION: Courtney Bowen is a 67 y.o. female status post definitive radiation for a T2 N0 tonsil cancer with no evidence of disease  PLAN:  I encouraged Courtney Bowen to go ahead with her PET scan. We discussed that this would be the last scan that would be performed unless she had symptoms. I have put an order in for IV access to be performed on Monday morning before her scan which will hopefully alleviate some of the pressure that happened while she was at radiology. We discussed the need for protein and I encouraged her to think about milkshakes or other way she get protein bound. I will last our dietitian by Courtney Bowen to followup with her as well. I offered her a trial of Salagen. We're going to call outpatient pharmacy and see what her co-pay would be for that. I will plan on seeing her back in 3 months.    [name], MD

## 2013-01-09 ENCOUNTER — Encounter (HOSPITAL_COMMUNITY)
Admission: RE | Admit: 2013-01-09 | Discharge: 2013-01-09 | Disposition: A | Discharge status: Home / Self Care | Payer: Medicare Other | Source: Ambulatory Visit | Attending: Radiation Oncology | Admitting: Radiation Oncology

## 2013-01-09 ENCOUNTER — Telehealth: Payer: Self-pay | Admitting: *Deleted

## 2013-01-09 ENCOUNTER — Ambulatory Visit: Payer: Medicare Other

## 2013-01-09 DIAGNOSIS — K8689 Other specified diseases of pancreas: Secondary | ICD-10-CM | POA: Insufficient documentation

## 2013-01-09 DIAGNOSIS — J438 Other emphysema: Secondary | ICD-10-CM | POA: Insufficient documentation

## 2013-01-09 DIAGNOSIS — C099 Malignant neoplasm of tonsil, unspecified: Secondary | ICD-10-CM | POA: Insufficient documentation

## 2013-01-09 DIAGNOSIS — K115 Sialolithiasis: Secondary | ICD-10-CM | POA: Diagnosis not present

## 2013-01-09 DIAGNOSIS — I700 Atherosclerosis of aorta: Secondary | ICD-10-CM | POA: Insufficient documentation

## 2013-01-09 DIAGNOSIS — R918 Other nonspecific abnormal finding of lung field: Secondary | ICD-10-CM | POA: Diagnosis not present

## 2013-01-09 LAB — GLUCOSE, CAPILLARY: Glucose-Capillary: 98 mg/dL (ref 70–99)

## 2013-01-09 MED ORDER — FLUDEOXYGLUCOSE F - 18 (FDG) INJECTION
19.3000 | Freq: Once | INTRAVENOUS | Status: AC | PRN
  Administered 2013-01-09: 19.3 via INTRAVENOUS

## 2013-01-09 MED ORDER — HEPARIN SOD (PORK) LOCK FLUSH 100 UNIT/ML IV SOLN
500.0000 [IU] | Freq: Once | INTRAVENOUS | Status: DC
  Filled 2013-01-09: qty 5

## 2013-01-09 MED ORDER — SODIUM CHLORIDE 0.9 % IJ SOLN
10.0000 mL | INTRAMUSCULAR | Status: DC | PRN
  Filled 2013-01-09: qty 10

## 2013-01-09 NOTE — Progress Notes (Signed)
Patient seen to have IV access due to multiple attempts in PET last week. IV placed patient sent to PET.

## 2013-01-09 NOTE — Telephone Encounter (Signed)
CALLED PATIENT TO INFORM OF NUTRITION APPT. FOR 01-10-13, SPOKE WITH PATIENT AND SHE IS AWARE OF THIS APPT.

## 2013-01-10 ENCOUNTER — Ambulatory Visit: Payer: Medicare Other | Admitting: Nutrition

## 2013-01-10 LAB — GLUCOSE, CAPILLARY: Glucose-Capillary: 33 mg/dL — CL (ref 70–99)

## 2013-01-10 NOTE — Progress Notes (Signed)
Patient presents to nutrition followup.  Patient reports she has tried to eat more solid food.  She states the only food she tolerates are vegetables.  She is not tolerating meats or breads.  She could not swallow bean soup.  Her weight has decreased to 113 pounds documented September 16.  Patient states she has a good appetite and she wants to eat.  She just is having difficulty with food consistencies.  She describes herself as a very picky eater and refuses many different food suggestions.  Nutrition diagnosis: Food and nutrition related knowledge deficit continues.  Intervention: I have reviewed strategies for patient to blenderize foods and soups to a smoother texture.  We have discussed altering temperatures of foods.  I've encouraged her to make milkshakes using oral nutrition supplements with ice cream.  I've given her multiple menu suggestions.  Patient verbalizing many reasons why she cannot try the suggestions.  She did admit that there were some suggestions she hadn't thought of that she would potentially try.  I've educated her on the importance of adequate protein for healing.  She understands that she only has so many options to increase protein intake.  I've encouraged her to begin to try some different strategies for increasing oral intake overall.  Monitoring, evaluation, goals: Patient will work to increase overall oral intake of calories and protein to promote healing.  Next visit: Patient will contact me after she has tried some suggestions that were offered today.

## 2013-01-11 ENCOUNTER — Encounter: Payer: Self-pay | Admitting: *Deleted

## 2013-01-11 NOTE — Progress Notes (Signed)
Discussed patient during weekly multidisciplinary support team meeting.   Will navigate patient at L3 (treatments completed) per request from Dr. Pablo Ledger.  Gayleen Orem, RN, BSN, [facility] Navigator [phone]

## 2013-01-13 ENCOUNTER — Ambulatory Visit: Payer: Medicare Other | Admitting: Radiation Oncology

## 2013-01-13 ENCOUNTER — Telehealth: Payer: Self-pay

## 2013-01-13 NOTE — Telephone Encounter (Signed)
Informed patient of PET results per Dr.Wentworth, looks good, stable.Resolution of hypermetabolism, no skeletal metastasis.Patient relieved and knows to return for follow up in November.

## 2013-01-17 DIAGNOSIS — Z23 Encounter for immunization: Secondary | ICD-10-CM | POA: Diagnosis not present

## 2013-01-31 DIAGNOSIS — K115 Sialolithiasis: Secondary | ICD-10-CM | POA: Diagnosis not present

## 2013-01-31 DIAGNOSIS — Z85819 Personal history of malignant neoplasm of unspecified site of lip, oral cavity, and pharynx: Secondary | ICD-10-CM | POA: Diagnosis not present

## 2013-01-31 DIAGNOSIS — J019 Acute sinusitis, unspecified: Secondary | ICD-10-CM | POA: Diagnosis not present

## 2013-02-20 DIAGNOSIS — K115 Sialolithiasis: Secondary | ICD-10-CM | POA: Diagnosis not present

## 2013-02-20 DIAGNOSIS — C090 Malignant neoplasm of tonsillar fossa: Secondary | ICD-10-CM | POA: Diagnosis not present

## 2013-02-20 DIAGNOSIS — J019 Acute sinusitis, unspecified: Secondary | ICD-10-CM | POA: Diagnosis not present

## 2013-02-27 ENCOUNTER — Encounter (HOSPITAL_COMMUNITY): Payer: Self-pay | Admitting: Emergency Medicine

## 2013-02-27 ENCOUNTER — Observation Stay (HOSPITAL_COMMUNITY)
Admission: EM | Admit: 2013-02-27 | Discharge: 2013-02-28 | Disposition: A | Discharge status: Home / Self Care | Payer: Medicare Other | Attending: Internal Medicine | Admitting: Internal Medicine

## 2013-02-27 DIAGNOSIS — K115 Sialolithiasis: Secondary | ICD-10-CM | POA: Diagnosis present

## 2013-02-27 DIAGNOSIS — Z9181 History of falling: Secondary | ICD-10-CM | POA: Diagnosis not present

## 2013-02-27 DIAGNOSIS — K219 Gastro-esophageal reflux disease without esophagitis: Secondary | ICD-10-CM | POA: Insufficient documentation

## 2013-02-27 DIAGNOSIS — I10 Essential (primary) hypertension: Secondary | ICD-10-CM | POA: Insufficient documentation

## 2013-02-27 DIAGNOSIS — R42 Dizziness and giddiness: Secondary | ICD-10-CM | POA: Diagnosis not present

## 2013-02-27 DIAGNOSIS — Z87891 Personal history of nicotine dependence: Secondary | ICD-10-CM | POA: Insufficient documentation

## 2013-02-27 DIAGNOSIS — M109 Gout, unspecified: Secondary | ICD-10-CM | POA: Diagnosis not present

## 2013-02-27 DIAGNOSIS — R269 Unspecified abnormalities of gait and mobility: Secondary | ICD-10-CM | POA: Diagnosis not present

## 2013-02-27 DIAGNOSIS — E876 Hypokalemia: Secondary | ICD-10-CM | POA: Diagnosis not present

## 2013-02-27 DIAGNOSIS — Z79899 Other long term (current) drug therapy: Secondary | ICD-10-CM | POA: Insufficient documentation

## 2013-02-27 DIAGNOSIS — C099 Malignant neoplasm of tonsil, unspecified: Secondary | ICD-10-CM | POA: Diagnosis present

## 2013-02-27 DIAGNOSIS — I671 Cerebral aneurysm, nonruptured: Secondary | ICD-10-CM | POA: Diagnosis not present

## 2013-02-27 DIAGNOSIS — R404 Transient alteration of awareness: Secondary | ICD-10-CM | POA: Diagnosis not present

## 2013-02-27 DIAGNOSIS — R112 Nausea with vomiting, unspecified: Secondary | ICD-10-CM | POA: Diagnosis not present

## 2013-02-27 DIAGNOSIS — E7800 Pure hypercholesterolemia, unspecified: Secondary | ICD-10-CM | POA: Insufficient documentation

## 2013-02-27 DIAGNOSIS — H8110 Benign paroxysmal vertigo, unspecified ear: Secondary | ICD-10-CM

## 2013-02-27 DIAGNOSIS — Z923 Personal history of irradiation: Secondary | ICD-10-CM | POA: Diagnosis not present

## 2013-02-27 DIAGNOSIS — E119 Type 2 diabetes mellitus without complications: Secondary | ICD-10-CM | POA: Diagnosis not present

## 2013-02-27 DIAGNOSIS — E860 Dehydration: Secondary | ICD-10-CM | POA: Insufficient documentation

## 2013-02-27 DIAGNOSIS — I951 Orthostatic hypotension: Secondary | ICD-10-CM

## 2013-02-27 LAB — CBC WITH DIFFERENTIAL/PLATELET
Basophils Absolute: 0 10*3/uL (ref 0.0–0.1)
Eosinophils Absolute: 0 10*3/uL (ref 0.0–0.7)
Lymphocytes Relative: 5 % — ABNORMAL LOW (ref 12–46)
MCHC: 35.7 g/dL (ref 30.0–36.0)
Monocytes Relative: 3 % (ref 3–12)
Neutrophils Relative %: 92 % — ABNORMAL HIGH (ref 43–77)
Platelets: 251 10*3/uL (ref 150–400)
RDW: 13.7 % (ref 11.5–15.5)
WBC: 5.4 10*3/uL (ref 4.0–10.5)

## 2013-02-27 LAB — COMPREHENSIVE METABOLIC PANEL
AST: 13 U/L (ref 0–37)
Albumin: 4.1 g/dL (ref 3.5–5.2)
Alkaline Phosphatase: 76 U/L (ref 39–117)
BUN: 9 mg/dL (ref 6–23)
Chloride: 95 mEq/L — ABNORMAL LOW (ref 96–112)
Potassium: 3.5 mEq/L (ref 3.5–5.1)
Total Bilirubin: 0.3 mg/dL (ref 0.3–1.2)
Total Protein: 7.5 g/dL (ref 6.0–8.3)

## 2013-02-27 MED ORDER — MECLIZINE HCL 25 MG PO TABS
25.0000 mg | ORAL_TABLET | Freq: Once | ORAL | Status: AC
  Administered 2013-02-27: 25 mg via ORAL
  Filled 2013-02-27: qty 1

## 2013-02-27 MED ORDER — LORAZEPAM 2 MG/ML IJ SOLN
2.0000 mg | Freq: Once | INTRAMUSCULAR | Status: AC
  Administered 2013-02-27: 2 mg via INTRAVENOUS
  Filled 2013-02-27: qty 1

## 2013-02-27 NOTE — ED Notes (Signed)
Pt refused any other location of IV stick except antecubital.  Explained potential for restick d/t position if admitted.  Pt aware.

## 2013-02-27 NOTE — ED Provider Notes (Addendum)
CSN: [id number]     Arrival date & time 02/27/13  2124 History   First MD Initiated Contact with Patient 02/27/13 2200     Chief Complaint  Patient presents with  . Chills  . Dizziness   (Consider location/radiation/quality/duration/timing/severity/associated sxs/prior Treatment) HPI Comments: Pt comes in with cc of dizziness - described as vertigo. Pt has hx of DM, no hx of strokes. States she started having intermittent vertigo y'day, but it is worse today. The vertigo is associated with nausea, and is quite severe - leading her to fall down a couple of times. She has no headaches, no neck pain. No new meds.  The history is provided by the patient.    Past Medical History  Diagnosis Date  . Submandibular sialolithiasis     right  . Squamous cell carcinoma of tonsil 07/08/12    right  . Cancer 07/08/12    right tonsil  . Diabetes mellitus without complication   . GERD (gastroesophageal reflux disease)   . Gout   . Hypercholesterolemia   . Renal insufficiency     history of  . Hypertension     not on any meds  . Status post radiation therapy within last four weeks 08/17/11-10/07/12    squamous cell of tonsil   Past Surgical History  Procedure Laterality Date  . Breast biopsy      hx of  . Cholecystectomy  20 yrs ago    laproscopic   Family History  Problem Relation Age of Onset  . Stroke Mother   . Stroke Father   . Diabetes Brother    History  Substance Use Topics  . Smoking status: Former Smoker -- 1.50 packs/day for 45 years    Types: Cigarettes    Quit date: 07/07/2012  . Smokeless tobacco: Not on file     Comment: 1-1.5 ppd  . Alcohol Use: No     Comment: socially yrs ago   OB History   Grav Para Term Preterm Abortions TAB SAB Ect Mult Living                 Review of Systems  Constitutional: Negative for activity change.  Respiratory: Negative for shortness of breath.   Cardiovascular: Negative for chest pain.  Gastrointestinal: Negative for nausea,  vomiting and abdominal pain.  Genitourinary: Negative for dysuria.  Musculoskeletal: Negative for neck pain.  Neurological: Positive for dizziness and light-headedness. Negative for syncope and headaches.    Allergies  Amoxicillin and Erythromycin  Home Medications   Current Outpatient Rx  Name  Route  Sig  Dispense  Refill  . atorvastatin (LIPITOR) 10 MG tablet   Oral   Take 10 mg by mouth daily.         . cholecalciferol (VITAMIN D) 400 UNITS TABS   Oral   Take 400 Units by mouth daily.          . metFORMIN (GLUCOPHAGE) 1000 MG tablet   Oral   Take 1,000 mg by mouth 2 (two) times daily with a meal.         . omeprazole (PRILOSEC) 10 MG capsule   Oral   Take 10 mg by mouth daily.         . polyethylene glycol (MIRALAX / GLYCOLAX) packet   Oral   Take 17 g by mouth daily as needed.           BP 135/81  Pulse 90  Temp(Src) 97.6 F (36.4 C) (Oral)  Resp 16  SpO2 97% Physical Exam  Constitutional: She is oriented to person, place, and time. She appears well-developed and well-nourished.  HENT:  Head: Normocephalic and atraumatic.  Pt has some saccadic eye movement with right gaze  Eyes: EOM are normal. Pupils are equal, round, and reactive to light.  Neck: Neck supple.  Cardiovascular: Normal rate, regular rhythm and normal heart sounds.   No murmur heard. Pulmonary/Chest: Effort normal. No respiratory distress.  Abdominal: Soft. She exhibits no distension. There is no tenderness. There is no rebound and no guarding.  Neurological: She is alert and oriented to person, place, and time. No cranial nerve deficit. Coordination normal.  Reproducible symptoms with patient sitting up. Cerebellar exam normal - finger to nose.  Skin: Skin is warm and dry.    ED Course  Procedures (including critical care time) Labs Review Labs Reviewed  CBC WITH DIFFERENTIAL - Abnormal; Notable for the following:    Hemoglobin 11.6 (*)    HCT 32.5 (*)    MCV 74.7 (*)     Neutrophils Relative % 92 (*)    Lymphocytes Relative 5 (*)    Lymphs Abs 0.3 (*)    All other components within normal limits  COMPREHENSIVE METABOLIC PANEL - Abnormal; Notable for the following:    Chloride 95 (*)    Glucose, Bld 155 (*)    Calcium 7.7 (*)    GFR calc non Af Amer 87 (*)    All other components within normal limits   Imaging Review No results found.  EKG Interpretation   None       MDM  No diagnosis found.  DDx includes: Central vertigo:  Tumor  Stroke  ICH  Vertebrobasilar TIA  Peripheral Vertigo:  BPPV  Vestibular neuritis  Meniere disease  Migrainous vertigo  Ear Infection   Pt comes in with intermittent, severe vertigo with nausea. Neuro exam normal, no headaches. Orthostatics are positive - and her ear exam is WNL.  Pt's sx are consistent with peripheral vertigo. Has some saccadic eye movement, and symptoms were reproducible. Will ambulate in the ED. Valium given, which helped her with her sx.     11:51 PM Pt feeling better. Appears to be BPPV/Peripheral vertigo. Will give meds, ENT f/u - although, she has her own ENT, and epley's maneuvers.   Varney Biles, MD 02/28/13 0018  12:36 AM RN states that patient walked to the bathroom with a stabilizer on her own, and did well. Advised patient to be careful with walking, and she understands. Family supportive.  Varney Biles, MD 02/28/13 0037  1:44 AM When discharged, patient was noted to be very unsteady. Lives alone. Called Brother [phone] - no response. Dont feel comfortable sending her home. Admit to med, obs.  Varney Biles, MD 02/28/13 0145

## 2013-02-27 NOTE — ED Notes (Signed)
Pt from home.  Pt with fever and chills at home.  Dizziness. N/v/d.  Orthostatic on EMS assessment.  Vitals 122 palp, hr 62, cbg 136.  HX  DM, HTN.

## 2013-02-27 NOTE — ED Notes (Signed)
Bed: [id number] Expected date:  Expected time:  Means of arrival:  Comments: EMS/N/V/chills and dizziness-no travel

## 2013-02-28 ENCOUNTER — Observation Stay (HOSPITAL_COMMUNITY): Payer: Medicare Other

## 2013-02-28 ENCOUNTER — Encounter (HOSPITAL_COMMUNITY): Payer: Self-pay | Admitting: Internal Medicine

## 2013-02-28 ENCOUNTER — Emergency Department (HOSPITAL_COMMUNITY): Payer: Medicare Other

## 2013-02-28 DIAGNOSIS — E119 Type 2 diabetes mellitus without complications: Secondary | ICD-10-CM | POA: Diagnosis present

## 2013-02-28 DIAGNOSIS — R42 Dizziness and giddiness: Secondary | ICD-10-CM | POA: Diagnosis not present

## 2013-02-28 DIAGNOSIS — I6350 Cerebral infarction due to unspecified occlusion or stenosis of unspecified cerebral artery: Secondary | ICD-10-CM | POA: Diagnosis not present

## 2013-02-28 DIAGNOSIS — E876 Hypokalemia: Secondary | ICD-10-CM

## 2013-02-28 DIAGNOSIS — E860 Dehydration: Secondary | ICD-10-CM | POA: Diagnosis present

## 2013-02-28 DIAGNOSIS — I671 Cerebral aneurysm, nonruptured: Secondary | ICD-10-CM | POA: Diagnosis present

## 2013-02-28 DIAGNOSIS — I951 Orthostatic hypotension: Secondary | ICD-10-CM | POA: Diagnosis present

## 2013-02-28 DIAGNOSIS — H8110 Benign paroxysmal vertigo, unspecified ear: Secondary | ICD-10-CM

## 2013-02-28 LAB — CBC WITH DIFFERENTIAL/PLATELET
Eosinophils Relative: 0 % (ref 0–5)
HCT: 30.2 % — ABNORMAL LOW (ref 36.0–46.0)
Hemoglobin: 10.8 g/dL — ABNORMAL LOW (ref 12.0–15.0)
Lymphocytes Relative: 17 % (ref 12–46)
Lymphs Abs: 0.7 10*3/uL (ref 0.7–4.0)
MCV: 74.9 fL — ABNORMAL LOW (ref 78.0–100.0)
Monocytes Absolute: 0.5 10*3/uL (ref 0.1–1.0)
Monocytes Relative: 12 % (ref 3–12)
Neutro Abs: 2.9 10*3/uL (ref 1.7–7.7)
RBC: 4.03 MIL/uL (ref 3.87–5.11)
RDW: 13.7 % (ref 11.5–15.5)
WBC: 4.1 10*3/uL (ref 4.0–10.5)

## 2013-02-28 LAB — BASIC METABOLIC PANEL
CO2: 22 mEq/L (ref 19–32)
Chloride: 101 mEq/L (ref 96–112)
Creatinine, Ser: 0.6 mg/dL (ref 0.50–1.10)
Glucose, Bld: 91 mg/dL (ref 70–99)

## 2013-02-28 LAB — GLUCOSE, CAPILLARY: Glucose-Capillary: 91 mg/dL (ref 70–99)

## 2013-02-28 LAB — URINALYSIS, ROUTINE W REFLEX MICROSCOPIC
Glucose, UA: NEGATIVE mg/dL
Hgb urine dipstick: NEGATIVE
Leukocytes, UA: NEGATIVE
Protein, ur: NEGATIVE mg/dL
pH: 6 (ref 5.0–8.0)

## 2013-02-28 LAB — HEMOGLOBIN A1C: Hgb A1c MFr Bld: 6.2 % — ABNORMAL HIGH (ref <5.7)

## 2013-02-28 LAB — PREALBUMIN: Prealbumin: 16.6 mg/dL — ABNORMAL LOW (ref 17.0–34.0)

## 2013-02-28 MED ORDER — INSULIN ASPART 100 UNIT/ML ~~LOC~~ SOLN: 0.0000 [IU] | Freq: Three times a day (TID) | SUBCUTANEOUS | Status: DC

## 2013-02-28 MED ORDER — SODIUM CHLORIDE 0.9 % IV BOLUS (SEPSIS)
500.0000 mL | Freq: Once | INTRAVENOUS | Status: AC
  Administered 2013-02-28: 500 mL via INTRAVENOUS

## 2013-02-28 MED ORDER — PANTOPRAZOLE SODIUM 40 MG PO TBEC
40.0000 mg | DELAYED_RELEASE_TABLET | Freq: Every day | ORAL | Status: DC
  Administered 2013-02-28: 40 mg via ORAL
  Filled 2013-02-28: qty 1

## 2013-02-28 MED ORDER — MAGNESIUM SULFATE 40 MG/ML IJ SOLN
4.0000 g | Freq: Once | INTRAMUSCULAR | Status: AC
  Administered 2013-02-28: 4 g via INTRAVENOUS
  Filled 2013-02-28: qty 100

## 2013-02-28 MED ORDER — GADOBENATE DIMEGLUMINE 529 MG/ML IV SOLN
10.0000 mL | Freq: Once | INTRAVENOUS | Status: AC | PRN
  Administered 2013-02-28: 10 mL via INTRAVENOUS

## 2013-02-28 MED ORDER — INSULIN ASPART 100 UNIT/ML ~~LOC~~ SOLN: 0.0000 [IU] | Freq: Every day | SUBCUTANEOUS | Status: DC

## 2013-02-28 MED ORDER — ATORVASTATIN CALCIUM 10 MG PO TABS
10.0000 mg | ORAL_TABLET | Freq: Every day | ORAL | Status: DC
Start: 2013-02-28 — End: 2013-02-28
  Filled 2013-02-28: qty 1

## 2013-02-28 MED ORDER — SODIUM CHLORIDE 0.9 % IV SOLN
INTRAVENOUS | Status: AC
  Administered 2013-02-28: 07:00:00 via INTRAVENOUS

## 2013-02-28 MED ORDER — POLYETHYLENE GLYCOL 3350 17 G PO PACK
17.0000 g | PACK | Freq: Every day | ORAL | Status: DC | PRN
  Filled 2013-02-28: qty 1

## 2013-02-28 MED ORDER — ASPIRIN 325 MG PO TABS
325.0000 mg | ORAL_TABLET | Freq: Every day | ORAL | Status: DC
  Administered 2013-02-28: 325 mg via ORAL
  Filled 2013-02-28: qty 1

## 2013-02-28 MED ORDER — POTASSIUM CHLORIDE CRYS ER 20 MEQ PO TBCR
40.0000 meq | EXTENDED_RELEASE_TABLET | ORAL | Status: DC
  Administered 2013-02-28 (×2): 40 meq via ORAL
  Filled 2013-02-28 (×3): qty 2

## 2013-02-28 MED ORDER — ACETAMINOPHEN 325 MG PO TABS: 650.0000 mg | ORAL_TABLET | ORAL | Status: DC | PRN

## 2013-02-28 MED ORDER — POTASSIUM CHLORIDE CRYS ER 20 MEQ PO TBCR: 40.0000 meq | EXTENDED_RELEASE_TABLET | ORAL | Status: DC

## 2013-02-28 MED ORDER — MECLIZINE HCL 12.5 MG PO TABS: 12.5000 mg | ORAL_TABLET | Freq: Three times a day (TID) | ORAL | Status: DC | PRN

## 2013-02-28 NOTE — H&P (Addendum)
PCP: Mathews Argyle, MD    Chief Complaint:  I felt lik i should go to the hospital  HPI: Courtney Bowen is a 67 y.o. 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   Presented with  Patient is difficult to obtain hx she received ativan 2mg  IV and meclizine. Per ER hx she presented with vertigo and repeated falls. She reports not eating well. Deneis chest pain or shortness of breath. States starting from yesterday she felt like the room was spinning but also lightheaded. Reports episodes lasting few minutes associated with nausea and vomiting. The vertigo is postitional. Denies localized weakness or slurred speech. Hospitalist called for further admission and vertigo vs presyncope work up.  Patient has hx of head and neck cancer followed by Dr. Pablo Ledger sp radiation therapy. Last PET scan was 9/15 and showed diminished activity with resolution of hypermetabolism.  Patient has problems with eating due to diminished saliva production. In ER patient was noted to be orthostatic.  Marland Kitchen   Review of Systems:    Pertinent positives include: virtigo lightheadedness, She does endorse dry cough for the pas few months.   gait abnormality   Constitutional:  No weight loss, night sweats, Fevers, chills, fatigue, weight loss  HEENT:  No headaches, Difficulty swallowing,Tooth/dental problems,Sore throat,  No sneezing, itching, ear ache, nasal congestion, post nasal drip,  Cardio-vascular:  No chest pain, Orthopnea, PND, anasarca, dizziness, palpitations.no Bilateral lower extremity swelling  GI:  No heartburn, indigestion, abdominal pain, nausea, vomiting, diarrhea, change in bowel habits, loss of  appetite, melena, blood in stool, hematemesis Resp:  no shortness of breath at rest. No dyspnea on exertion, No excess mucus, no productive cough,  No coughing up of blood.No change in color of mucus.No wheezing. Skin:  no rash or lesions. No jaundice GU:  no dysuria, change in color of urine, no urgency or frequency. No straining to urinate.  No flank pain.  Musculoskeletal:  No joint pain or no joint swelling. No decreased range of motion. No back pain.  Psych:  No change in mood or affect. No depression or anxiety. No memory loss.  Neuro: no localizing neurological complaints, no tingling, no weakness, no double vision, no no slurred speech, no confusion  Otherwise ROS are negative except for above, 10 systems were reviewed  Past Medical History: Past Medical History  Diagnosis Date  . Submandibular sialolithiasis     right  . Squamous cell carcinoma of tonsil 07/08/12    right  . Cancer 07/08/12    right tonsil  . Diabetes mellitus without complication   . GERD (gastroesophageal reflux disease)   . Gout   . Hypercholesterolemia   . Renal insufficiency     history of  . Hypertension     not on any meds  . Status post radiation therapy within last four weeks 08/17/11-10/07/12    squamous cell of tonsil   Past Surgical History  Procedure Laterality Date  . Breast biopsy      hx of  . Cholecystectomy  20 yrs ago    laproscopic     Medications: Prior to Admission medications   Medication Sig Start Date End Date Taking? Authorizing Provider  atorvastatin (LIPITOR) 10 MG tablet Take 10 mg by mouth daily.   Yes Historical Provider, MD  cholecalciferol (VITAMIN D) 400 UNITS TABS Take 400 Units by mouth daily.    Yes Historical Provider, MD  metFORMIN (GLUCOPHAGE) 1000 MG tablet Take 1,000 mg by mouth 2 (two) times daily with a meal.   Yes Historical Provider, MD  omeprazole (PRILOSEC) 10 MG capsule Take 10 mg by mouth daily.   Yes Historical Provider, MD  polyethylene  glycol (MIRALAX / GLYCOLAX) packet Take 17 g by mouth daily as needed.     Historical Provider, MD    Allergies:   Allergies  Allergen Reactions  . Amoxicillin Nausea And Vomiting    It makes her very sick  . Erythromycin Nausea And Vomiting    Social History:  Ambulatory independently   Lives at   Home alone    reports that she quit smoking about 7 months ago. Her smoking use included Cigarettes. She has a 67.5 pack-year smoking history. She does not have any smokeless tobacco history on file. She reports that she does not drink alcohol or use illicit drugs.   Family History: family history includes Diabetes in her brother; Stroke in her father and mother.    Physical Exam: Patient Vitals for the past 24 hrs:  BP Temp Temp src Pulse Resp SpO2  02/27/13 2128 135/81 mmHg - - 90 - -  02/27/13 2127 153/81 mmHg - - 66 - -  02/27/13 2126 160/89 mmHg 97.6 F (36.4 C) Oral 70 16 97 %    1. General:  in No Acute distress, thin female 2. Psychological: lethargic but Oriented 3. Head/ENT:   dry   Mucous Membranes                          Head Non traumatic, neck supple                          Normal  Dentition 4. SKIN:   decreased Skin turgor,  Skin clean Dry and intact no rash 5. Heart: Regular rate and rhythm no Murmur, Rub or gallop 6. Lungs: Clear to auscultation bilaterally, no wheezes or crackles   7. Abdomen: Soft, non-tender, Non distended 8. Lower extremities: no clubbing, cyanosis, or edema 9. Neurologically Mild nystagmus present, strength 5/5 in all 4 ext, CN 2-12 intact 10. MSK: Normal range of motion  body mass index is unknown because there is no weight on file.   Labs on Admission:   Recent Labs  02/27/13 2158  NA 136  K 3.5  CL 95*  CO2 23  GLUCOSE 155*  BUN 9  CREATININE 0.73  CALCIUM 7.7*    Recent Labs  02/27/13 2158  AST 13  ALT 5  ALKPHOS 76  BILITOT 0.3  PROT 7.5  ALBUMIN 4.1   No results found for this basename: LIPASE, AMYLASE,   in the last 72 hours  Recent Labs  02/27/13 2158  WBC 5.4  NEUTROABS 4.9  HGB 11.6*  HCT 32.5*  MCV 74.7*  PLT 251   No results found for this basename: CKTOTAL, CKMB, CKMBINDEX, TROPONINI,  in the last 72 hours No results found for this basename: TSH, T4TOTAL, FREET3, T3FREE, THYROIDAB,  in the last 72 hours No results found for this basename: VITAMINB12, FOLATE, FERRITIN, TIBC, IRON, RETICCTPCT,  in the last 72 hours No results found for this basename: HGBA1C    The CrCl is unknown because both a height and weight (above a minimum accepted value) are required for  this calculation. ABG No results found for this basename: phart, pco2, po2, hco3, tco2, acidbasedef, o2sat     No results found for this basename: DDIMER      Cultures: No results found for this basename: sdes, specrequest, cult, reptstatus       Radiological Exams on Admission: Ct Head Wo Contrast  02/28/2013   CLINICAL DATA:  Vertigo. Nausea. Fever and chills.  EXAM: CT HEAD WITHOUT CONTRAST  TECHNIQUE: Contiguous axial images were obtained from the base of the skull through the vertex without intravenous contrast.  COMPARISON:  Head CT 11/10/2012.  FINDINGS: Patchy areas of subtle decreased attenuation noted in the white matter cerebral hemispheres bilaterally, most likely indicative of mild chronic microvascular ischemic disease. No acute intracranial abnormalities. Specifically, no evidence of acute intracranial hemorrhage, no definite findings of acute/subacute cerebral ischemia, no mass, mass effect, hydrocephalus or abnormal intra or extra-axial fluid collections. Visualized paranasal sinuses and mastoids are well pneumatized. No acute displaced skull fractures are identified.  IMPRESSION: 1. No acute intracranial abnormalities. 2. Very mild chronic microvascular ischemic changes in the cerebral white matter.   Electronically Signed   By: Vinnie Langton M.D.   On: 02/28/2013 01:22    Chart has been  reviewed  Assessment/Plan  67 yo F w hx of head and neck Ca here with vertigo and dehydration due to Poor PO intake  Present on Admission:  . Vertigo - Given risk factors will evaluate for causes of central vertigo. Obtain MRI to evaluate for cerebellar abnormalities especially given hx of Cancer.  MRI is preferred method ov evaluation of posterior Fossa.  Will also order PT/OT. If CVA is found further CVA work up and neurology consult could be done.  . Dehydration - give IV fluid, recheck orthostatics, patient with poor pO intake will order nutritional consult and check prealbumin DM - SSI and hold metformin while inpatient  Prophylaxis: SCD   Protonix  CODE STATUS: FULL CODE  Other plan as per orders.  I have spent a total of 55 min on this admission  Rithvik Orcutt 02/28/2013, 2:14 AM

## 2013-02-28 NOTE — Progress Notes (Signed)
Spoke with pt concerning discharge plan, PCP appointment and [place].  PCP appt made at 0845am on Friday 03/03/13. Pt is aware and ok with that. Fax order to [facility] [phone].

## 2013-02-28 NOTE — ED Notes (Signed)
Pt up to ambulate initially felt dizzy but stated as she walked more felt ok. Pt noticed by RN tripping over her feet while walking and leaning side ways, unbalanced. MD aware.

## 2013-02-28 NOTE — Progress Notes (Signed)
CRITICAL VALUE ALERT  Critical value received: Magnesium 0.4  Date of notification:  02/28/13  Time of notification:  [id number]  Critical value read back:yes  Nurse who received alert:  Clovia Cuff, RN  MD notified (1st page):  Dr. Grandville Silos  Time of first page:  1627  MD notified (2nd page):  Time of second page:  Responding MD:  Dr. Grandville Silos  Time MD responded:  1630

## 2013-02-28 NOTE — Discharge Summary (Signed)
Physician Discharge Summary  Courtney Bowen [id number] DOB: 01/19/1946 DOA: 02/27/2013  PCP: Mathews Argyle, MD  Admit date: 02/27/2013 Discharge date: 02/28/2013  Time spent: 65 minutes  Recommendations for Outpatient Follow-up:  1. Patient is to followup with Mathews Argyle, MD this week as patient was being insistent on being discharged. Patient will need a comprehensive metabolic profile done to followup on electrolytes, renal function, calcium, albumin levels. Patient will need further workup on that hypocalcemia. Also noted are was a 2.5-3 mm aneurysm associated with early right MCA branch noted on MRI of the head. This would need to be followed up upon per PCP. 2. Patient will be set up for outpatient PT vestibular evaluation.  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  Presented with  Patient is difficult to obtain hx she received ativan 2mg  IV and meclizine. Per ER hx she presented with vertigo and repeated falls. She reports not eating well. Deneis chest pain or shortness of breath. States starting from yesterday she felt like the room was spinning  but also lightheaded. Reports episodes lasting few minutes associated with nausea and vomiting. The vertigo is postitional. Denies localized weakness or slurred speech. Hospitalist called for further admission and vertigo vs presyncope work up. Patient has hx of head and neck cancer followed by Dr. Pablo Ledger sp radiation therapy. Last PET scan was 9/15 and showed diminished activity with resolution of hypermetabolism.  Patient has problems with eating due to diminished saliva production. In ER patient was noted to be orthostatic.    Hospital Course:  #1 vertigo Questionable etiology. May be secondary to central vertigo versus orthostasis. Patient with clinical improvement with IV fluids. Patient has been seen by PT OT. MRI of the head was negative for any acute infarct. Patient is insistent on being discharged. Will set patient up with outpatient PT vestibular evaluation and meclizine when necessary. Patient is to followup with PCP as outpatient.  #2 orthostasis Patient on admission was noted to be orthostatic. Patient was hydrated with IV fluids with improvement in her orthostasis. Patient however was very insistent on being discharged and a such will discharge him with close followup with PCP as outpatient.  #3 hypocalcemia During the hospitalization patient's calcium levels were noted to be low at 6.9. Abdomen level was 4.1. Patient did not want any further workup done as an inpatient and insisted on being discharged for out patient workup. Patient will need to followup with PCP.  #4 hypokalemia During the hospitalization patient's potassium was noted to be a 2.9. Magnesium level was ordered but was pending at the time of discharge. Patient has been given oral potassium. We'll 40 by mouth x1. Patient was sent home on 2 more doses of Stadol 40 mEq and is to followup  with PCP as outpatient.  #5 dehydration Improved with hydration.  Procedures:  CT head 02/28/2013  MRI/MRA head  02/28/2013    Consultations:  None  Discharge Exam: Filed Vitals:   02/28/13 1357  BP: 138/72  Pulse: 97  Temp: 97.5 F (36.4 C)  Resp: 18    General: NAD Cardiovascular: RRR Respiratory: CTAB  Discharge Instructions      Discharge Orders   Future Appointments Provider Department Dept Phone   03/09/2013 1:20 PM [name], MD [place] [phone]   Future Orders Complete By Expires   Diet general  As directed    Discharge instructions  As directed    Comments:     Follow up with Mathews Argyle, MD this week.   Increase activity slowly  As directed        Medication List         atorvastatin 10 MG tablet  Commonly known as:  LIPITOR  Take 10 mg by mouth daily.     cholecalciferol 400 UNITS Tabs tablet  Commonly known as:  VITAMIN D  Take 400 Units by mouth daily.     meclizine 12.5 MG tablet  Commonly known as:  ANTIVERT  Take 1 tablet (12.5 mg total) by mouth 3 (three) times daily as needed for dizziness.     metFORMIN 1000 MG tablet  Commonly known as:  GLUCOPHAGE  Take 1,000 mg by mouth 2 (two) times daily with a meal.     omeprazole 10 MG capsule  Commonly known as:  PRILOSEC  Take 10 mg by mouth daily.     polyethylene glycol packet  Commonly known as:  MIRALAX / GLYCOLAX  Take 17 g by mouth daily as needed.     potassium chloride SA 20 MEQ tablet  Commonly known as:  K-DUR,KLOR-CON  Take 2 tablets (40 mEq total) by mouth every 4 (four) hours. Take for 2 doses then stop.       Allergies  Allergen Reactions  . Amoxicillin Nausea And Vomiting    It makes her very sick  . Erythromycin Nausea And Vomiting   Follow-up Information   Follow up with Mathews Argyle, MD. Schedule an appointment as soon as possible for a visit in 1 week. (f/u this week.)    Specialty:  Internal Medicine   Contact information:   [address] [place] [address] [phone]         The results of significant diagnostics from this hospitalization (including imaging, microbiology, ancillary and laboratory) are listed below for reference.    Significant Diagnostic Studies: Ct Head Wo Contrast  02/28/2013   CLINICAL DATA:  Vertigo. Nausea. Fever and chills.  EXAM: CT HEAD WITHOUT CONTRAST  TECHNIQUE: Contiguous axial images were obtained from the base of the skull through the vertex without intravenous contrast.  COMPARISON:  Head CT 11/10/2012.  FINDINGS: Patchy areas of subtle decreased attenuation noted in the white matter cerebral hemispheres bilaterally, most likely indicative of mild chronic microvascular ischemic disease. No acute intracranial abnormalities. Specifically, no evidence of acute intracranial hemorrhage, no definite findings of acute/subacute cerebral ischemia, no mass, mass effect, hydrocephalus or abnormal intra or extra-axial fluid collections. Visualized paranasal sinuses and mastoids are well pneumatized. No acute displaced skull fractures are identified.  IMPRESSION: 1. No acute intracranial abnormalities. 2. Very mild chronic microvascular ischemic changes in the cerebral white matter.   Electronically Signed   By: Vinnie Langton M.D.   On: 02/28/2013 01:22  Mr Jeri Cos Wo Contrast  02/28/2013   CLINICAL DATA:  Personal history of tonsillar cancer. Worsening vertigo and unsteady gait. Nausea. Question new CVA or brain metastases.  EXAM: MRI HEAD WITHOUT AND WITH CONTRAST  MRA HEAD WITHOUT CONTRAST  TECHNIQUE: Multiplanar, multiecho pulse sequences of the brain and surrounding structures were obtained without and with intravenous contrast. Angiographic images of the head were obtained using MRA technique without contrast.  CONTRAST:  45mL MULTIHANCE GADOBENATE DIMEGLUMINE 529 MG/ML IV SOLN  COMPARISON:  CT head without contrast  FINDINGS: MRI HEAD FINDINGS  The diffusion-weighted images demonstrate no evidence for acute or subacute infarction. A remote  left paramedian punctate lacunar infarct is present within the central pons. There larger remote lacunar infarct is present in the left cerebellum.  Scattered periventricular and subcortical T2 hyperintensities are greater than expected for age. The largest lesion is a subcortical T2 hyperintense lesion in the right frontal lobe measuring 10.5 mm on image 18 of series 8. There is no enhancement associated with these lesions.  No pathologic enhancement is evident to suggest metastatic disease of the brain.  Flow is present in the major intracranial arteries. The globes and orbits are intact. Mild mucosal thickening is present in the right maxillary sinus. There is some fluid in the mastoid air cells bilaterally. No obstructing nasopharyngeal lesions are present.  MRA HEAD FINDINGS  The study is moderately degraded by patient motion. The internal carotid arteries are within normal limits from the high cervical segments through the ICA termini bilaterally. A 2.5-3 mm aneurysm is associated with the early right MCA branch vessel. The A1 and M1 segments are otherwise within normal limits. No definite anterior communicating artery is identified. There is moderate attenuation of MCA branch vessels. This is likely exaggerated by artifact.  The right vertebral artery is dominant to the left. The PICA origins are below the field of view. The right AICA is visualized and normal. Both posterior cerebral arteries originate from the basilar tip. There is some attenuation of distal PCA branch vessels.  IMPRESSION: 1. No evidence for acute infarct or metastatic disease. 2. Diffuse white matter disease is greater than expected for age. The finding is nonspecific but can be seen in the setting of chronic microvascular ischemia, a demyelinating process such as multiple sclerosis, vasculitis, complicated migraine headaches, or as the sequelae of a prior infectious or inflammatory process. 3. 2.5-3 mm aneurysm associated with an early  right MCA branch vessel. 4. Moderate small vessel disease. This is likely exaggerated by significant patient motion.   Electronically Signed   By: Lawrence Santiago M.D.   On: 02/28/2013 10:50   Mr Jodene Nam Head/brain Wo Cm  02/28/2013   CLINICAL DATA:  Personal history of tonsillar cancer. Worsening vertigo and unsteady gait. Nausea. Question new CVA or brain metastases.  EXAM: MRI HEAD WITHOUT AND WITH CONTRAST  MRA HEAD WITHOUT CONTRAST  TECHNIQUE: Multiplanar, multiecho pulse sequences of the brain and surrounding structures were obtained without and with intravenous contrast. Angiographic images of the head were obtained using MRA technique without contrast.  CONTRAST:  32mL MULTIHANCE GADOBENATE DIMEGLUMINE 529 MG/ML IV SOLN  COMPARISON:  CT head without contrast  FINDINGS: MRI HEAD FINDINGS  The diffusion-weighted images demonstrate no evidence for acute or subacute infarction. A remote left paramedian punctate lacunar infarct is present within the central pons. There larger remote lacunar infarct is present in the left cerebellum.  Scattered periventricular and subcortical T2 hyperintensities are greater than expected for age. The largest  lesion is a subcortical T2 hyperintense lesion in the right frontal lobe measuring 10.5 mm on image 18 of series 8. There is no enhancement associated with these lesions.  No pathologic enhancement is evident to suggest metastatic disease of the brain.  Flow is present in the major intracranial arteries. The globes and orbits are intact. Mild mucosal thickening is present in the right maxillary sinus. There is some fluid in the mastoid air cells bilaterally. No obstructing nasopharyngeal lesions are present.  MRA HEAD FINDINGS  The study is moderately degraded by patient motion. The internal carotid arteries are within normal limits from the high cervical segments through the ICA termini bilaterally. A 2.5-3 mm aneurysm is associated with the early right MCA branch vessel. The  A1 and M1 segments are otherwise within normal limits. No definite anterior communicating artery is identified. There is moderate attenuation of MCA branch vessels. This is likely exaggerated by artifact.  The right vertebral artery is dominant to the left. The PICA origins are below the field of view. The right AICA is visualized and normal. Both posterior cerebral arteries originate from the basilar tip. There is some attenuation of distal PCA branch vessels.  IMPRESSION: 1. No evidence for acute infarct or metastatic disease. 2. Diffuse white matter disease is greater than expected for age. The finding is nonspecific but can be seen in the setting of chronic microvascular ischemia, a demyelinating process such as multiple sclerosis, vasculitis, complicated migraine headaches, or as the sequelae of a prior infectious or inflammatory process. 3. 2.5-3 mm aneurysm associated with an early right MCA branch vessel. 4. Moderate small vessel disease. This is likely exaggerated by significant patient motion.   Electronically Signed   By: Lawrence Santiago M.D.   On: 02/28/2013 10:50    Microbiology: No results found for this or any previous visit (from the past 240 hour(s)).   Labs: Basic Metabolic Panel:  Recent Labs Lab 02/27/13 2158 02/28/13 0751  NA 136 136  K 3.5 2.9*  CL 95* 101  CO2 23 22  GLUCOSE 155* 91  BUN 9 5*  CREATININE 0.73 0.60  CALCIUM 7.7* 6.9*   Liver Function Tests:  Recent Labs Lab 02/27/13 2158  AST 13  ALT 5  ALKPHOS 76  BILITOT 0.3  PROT 7.5  ALBUMIN 4.1   No results found for this basename: LIPASE, AMYLASE,  in the last 168 hours No results found for this basename: AMMONIA,  in the last 168 hours CBC:  Recent Labs Lab 02/27/13 2158 02/28/13 0751  WBC 5.4 4.1  NEUTROABS 4.9 2.9  HGB 11.6* 10.8*  HCT 32.5* 30.2*  MCV 74.7* 74.9*  PLT 251 201   Cardiac Enzymes: No results found for this basename: CKTOTAL, CKMB, CKMBINDEX, TROPONINI,  in the last 168  hours BNP: BNP (last 3 results) No results found for this basename: PROBNP,  in the last 8760 hours CBG:  Recent Labs Lab 02/28/13 0744 02/28/13 1139  GLUCAP 79 91       Signed:  THOMPSON,DANIEL  [facility] 02/28/2013, 2:45 PM

## 2013-02-28 NOTE — ED Notes (Signed)
Ambulated to door.  Pt slightly unsteady requiring RN assist to maintain balance.  MD notified.

## 2013-02-28 NOTE — Progress Notes (Signed)
Orthostatic BP completed by occupational therapy using dinamap and L arm.    Lying 144/76, HR 73 sitting 139/85, HR 77 standing 127/80, HR 95.

## 2013-02-28 NOTE — Evaluation (Signed)
Physical Therapy Evaluation Patient Details Name: Courtney Bowen MRN: [id number] DOB: 1945/05/27 Today's Date: 02/28/2013 Time: [id number] PT Time Calculation (min): 20 min  PT Assessment / Plan / Recommendation History of Present Illness  admitted with vertigo:  spinning and lightheadedness; dysequilibrium.  Started 2 days PTA and got worse on day of admission.  MRA results "No evidence for acute infarct or metastatic disease"  Clinical Impression  Pt admitted with above. Pt currently with functional limitations due to the deficits listed below (see PT Problem List).  Pt will benefit from skilled PT to increase their independence and safety with mobility to allow discharge to the venue listed below.  Spoke with OT prior to session about vestibular findings and OT recommended recheck Courtney Bowen on Left.  Performed Dix hallpike on L with same finding as OT "rotational movement noted in sclera but no c/o dizziness" so performed canalith repositioning technique (epley).  Pt reports feeling much improved and able to ambulate in hallway without dizziness however continues to report "off balance" feeling.  Discussed outpatient vestibular rehab and pt reports she should have transport.  Pt seems very agreeable to OPPT for f/u of vestibular issues and provided pt with referral form.  Will continue to see in acute care.      PT Assessment  Patient needs continued PT services    Follow Up Recommendations  Outpatient PT (vestibular rehab)    Does the patient have the potential to tolerate intense rehabilitation      Barriers to Discharge        Equipment Recommendations  None recommended by PT    Recommendations for Other Services     Frequency Min 3X/week    Precautions / Restrictions Precautions Precautions: Fall   Pertinent Vitals/Pain n/a      Mobility  Bed Mobility Bed Mobility: Supine to Sit;Sit to Supine Supine to Sit: 6: Modified independent (Device/Increase time) Sit to  Supine: 6: Modified independent (Device/Increase time) Transfers Transfers: Sit to Stand;Stand to Sit Sit to Stand: 4: Min guard;With upper extremity assist Stand to Sit: 4: Min guard;With upper extremity assist Details for Transfer Assistance: min/guard due to pt reports dizziness and off balance earlier OOB with OT however no posterior lean observed this visit Ambulation/Gait Ambulation/Gait Assistance: 4: Min guard Ambulation Distance (Feet): 300 Feet Assistive device: None Ambulation/Gait Assistance Details: occasional unsteadiness initially however with increased distance pt with improved gait pattern as she states fearful of disequilibrium however only slight off balance feeling reported and pt denies dizziness, also occasional grazing of hand rail  Gait Pattern: Step-through pattern;Narrow base of support Gait velocity: decreased    Exercises     PT Diagnosis: Difficulty walking;Other (comment) (decreased balance)  PT Problem List: Decreased balance;Decreased mobility;Other (comment) (vertigo) PT Treatment Interventions: DME instruction;Gait training;Stair training;Functional mobility training;Therapeutic activities;Therapeutic exercise;Patient/family education;Balance training;Neuromuscular re-education (CRT, Nestor Lewandowsky, vestibular exercises)     PT Goals(Current goals can be found in the care plan section) Acute Rehab PT Goals Patient Stated Goal: decrease off balance feeling PT Goal Formulation: With patient Time For Goal Achievement: 03/07/13 Potential to Achieve Goals: Good  Visit Information  Last PT Received On: 02/28/13 Assistance Needed: +1 History of Present Illness: admitted with vertigo:  spinning and lightheadedness; dysequilibrium.  Started 2 days PTA and got worse on day of admission.  MRA results "No evidence for acute infarct or metastatic disease"       Prior [place] expects to be discharged to:: Private  residence Living  Arrangements: Alone Available Help at Discharge: Family;Available PRN/intermittently Type of Home: House Home Access: Stairs to enter [employer] of Steps: 4 Entrance Stairs-Rails: Right Home Layout: One level Home Equipment: Cane - single point Additional Comments: can have sister in law stay with her.  Prior Function Level of Independence: Independent Communication Communication: No difficulties    Cognition  Cognition Arousal/Alertness: Awake/alert Behavior During Therapy: WFL for tasks assessed/performed Overall Cognitive Status: Within Functional Limits for tasks assessed    Extremity/Trunk Assessment Lower Extremity Assessment Lower Extremity Assessment: Overall WFL for tasks assessed   Balance Balance Balance Assessed: Yes Static Standing Balance Single Leg Stance - Right Leg: 3 Single Leg Stance - Left Leg: 5  End of Session PT - End of Session Equipment Utilized During Treatment: Gait belt Activity Tolerance: Patient tolerated treatment well Patient left: in bed;with call bell/phone within reach Nurse Communication: Mobility status  GP Functional Assessment Tool Used: clinical judgement Functional Limitation: Mobility: Walking and moving around Mobility: Walking and Moving Around Current Status [id number]): At least 1 percent but less than 20 percent impaired, limited or restricted Mobility: Walking and Moving Around Goal Status [phone]): 0 percent impaired, limited or restricted   Courtney Bowen,Courtney Bowen 02/28/2013, 1:44 PM Courtney Bowen, PT, DPT 02/28/2013 Pager: [phone]

## 2013-02-28 NOTE — Progress Notes (Signed)
Nutrition Brief Note  Patient identified on the Malnutrition Screening Tool (MST) Report and Consult  Wt Readings from Last 15 Encounters:  02/28/13 106 lb 6.4 oz (48.263 kg)  01/05/13 112 lb 12.8 oz (51.166 kg)  10/11/12 122 lb 12.8 oz (55.702 kg)  09/27/12 125 lb 14.4 oz (57.108 kg)  09/23/12 127 lb 12.8 oz (57.97 kg)  09/20/12 130 lb (58.968 kg)  09/16/12 130 lb (58.968 kg)  09/13/12 130 lb 4.8 oz (59.104 kg)  09/12/12 129 lb 14.4 oz (58.922 kg)  09/06/12 132 lb 11.2 oz (60.192 kg)  09/05/12 132 lb 3.2 oz (59.966 kg)  08/30/12 136 lb (61.689 kg)  08/23/12 138 lb 9.6 oz (62.869 kg)  08/05/12 139 lb 8 oz (63.277 kg)  08/03/12 139 lb 6.4 oz (63.231 kg)   Pt getting ready for discharge at time of visit.   Body mass index is 17.18 kg/(m^2). Patient meets criteria for Underweight based on current BMI. Weight history shows 22% wt loss in less than 6 months (meets 1 criteria for severe malnutrition). 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 She states that she has difficulty swallowing- food gets balled-up and gets stuck. Pt states she was drinking Ensure supplements for a while but, she got sick of them.  Discussed ways for pt to increase protein and calories in her diet. Reviewed easy to swallow foods and high protein nutritional supplements. Pt tried a sample of [employer] and enjoyed it.  Current diet order is Heart Healthy, patient is consuming approximately 50-75% of meals at this time. Labs and medications reviewed.   Encouraged pt to monitor weight. Encouraged pt to follow-up with outpatient cancer center dietitian and her PCP.    Pryor Ochoa RD, LDN Inpatient Clinical Dietitian Pager: [phone] After Hours Pager: [phone]

## 2013-02-28 NOTE — Evaluation (Signed)
Occupational Therapy Evaluation Patient Details Name: Courtney Bowen MRN: [id number] DOB: 18-Oct-1945 Today's Date: 02/28/2013 Time: [id number] OT Time Calculation (min): 34 min  OT Assessment / Plan / Recommendation History of present illness admitted with vertigo:  spinning and lightheadedness; dysequilibrium.  Started 2 days PTA and got worse on day of admission   Clinical Impression   Pt was admitted with vertigo.  At time of eval, she reported lightheadedness (not orthostatic) and demonstrated decreased balance with standing/mobility.  She will benefit from skilled OT to increase safety and independence with adls:  Goals are supervision to min guard in acute    OT Assessment  Patient needs continued OT Services    Follow Up Recommendations  Supervision/Assistance - 24 hour (OP vestibular/balance, if 24/7 and transportation)    Barriers to Discharge      Equipment Recommendations  3 in 1 bedside comode (possibly; to be further assessed)    Recommendations for Other Services    Frequency  Min 2X/week    Precautions / Restrictions Precautions Precautions: Fall Restrictions Weight Bearing Restrictions: No   Pertinent Vitals/Pain No pain reported.  Pt not orthostatic:  See vitals in doc flowsheets    ADL  Grooming: Teeth care;Supervision/safety Where Assessed - Grooming: Supported standing Toilet Transfer: Minimal assistance Toilet Transfer Method: Sit to Loss adjuster, chartered:  (bed) Transfers/Ambulation Related to ADLs: ambulated to sink with min A.  Lost balance to L once ADL Comments: Pt needs min A for adls due to dysequilibrium.  She is able to cross legs for socks.  Pt tended to tilt head towards left ear slightly and lost balance to L.  Tested for gaze holding nystagmus, VOR, Head Thrust and Head shaking nystagmus.  All negative.  Also placed pt in modified hallpike dix.  To L, pt had rotational movement noted in sclera but no c/o dizziness.  Asked PT to put  her into regular [facility] and perform Epley, if they have time.  If they don't, I will return and do this.  Pt wants to go home ASAP.  She states sister in law can stay with her 24/7 if needed    OT Diagnosis: Generalized weakness  OT Problem List: Decreased strength;Decreased activity tolerance;Impaired balance (sitting and/or standing) OT Treatment Interventions: Self-care/ADL training;DME and/or AE instruction;Patient/family education;Balance training   OT Goals(Current goals can be found in the care plan section) Acute Rehab OT Goals Patient Stated Goal: get home to my animals today OT Goal Formulation: With patient Time For Goal Achievement: 03/07/13 Potential to Achieve Goals: Good ADL Goals Pt Will Transfer to Toilet: with min guard assist;ambulating;regular height toilet;bedside commode Additional ADL Goal #1: pt will perform LB adls with set up/supervision, sit to stand Additional ADL Goal #2: Pt will gather clothes at min guard level with AD as needed  Visit Information  Last OT Received On: 02/28/13 Assistance Needed: +1 History of Present Illness: admitted with vertigo:  spinning and lightheadedness; dysequilibrium.  Started 2 days PTA and got worse on day of admission       Prior [place] expects to be discharged to:: Private residence Living Arrangements: Alone Additional Comments: can have sister in law stay with her. 4 STE with rail, one level, standard commode and tub/shower.  Pt has no DME Prior Function Level of Independence: Independent Communication Communication: No difficulties         Vision/Perception Vision - Assessment Eye Alignment: Within Functional Limits   Cognition  Cognition Arousal/Alertness: Awake/alert Behavior During Therapy: WFL for tasks assessed/performed (upset about heart healthy diet) Overall Cognitive Status: Within Functional Limits for tasks assessed    Extremity/Trunk Assessment  Upper Extremity Assessment Upper Extremity Assessment: Overall WFL for tasks assessed     Mobility Bed Mobility Bed Mobility: Supine to Sit Supine to Sit: 4: Min assist;HOB flat Transfers Transfers: Sit to Stand Sit to Stand: 4: Min assist Details for Transfer Assistance: pt with posterior lean     Exercise     Balance Balance Balance Assessed: Yes Static Standing Balance Static Standing - Balance Support: No upper extremity supported Static Standing - Level of Assistance: 4: Min assist Static Standing - Comment/# of Minutes: 1 minute   End of Session OT - End of Session Activity Tolerance: Patient tolerated treatment well Patient left: in chair;with call bell/phone within reach  GO Functional Assessment Tool Used: clinical observation Functional Limitation: Self care Self Care Current Status [id number]): At least 20 percent but less than 40 percent impaired, limited or restricted Self Care Goal Status [id number]): At least 1 percent but less than 20 percent impaired, limited or restricted   [facility] 02/28/2013, 10:10 AM Lesle Chris, OTR/L [phone] 02/28/2013

## 2013-03-02 ENCOUNTER — Other Ambulatory Visit: Payer: Self-pay

## 2013-03-03 DIAGNOSIS — E441 Mild protein-calorie malnutrition: Secondary | ICD-10-CM | POA: Diagnosis not present

## 2013-03-03 DIAGNOSIS — I729 Aneurysm of unspecified site: Secondary | ICD-10-CM | POA: Diagnosis not present

## 2013-03-03 DIAGNOSIS — R630 Anorexia: Secondary | ICD-10-CM | POA: Diagnosis not present

## 2013-03-03 DIAGNOSIS — I951 Orthostatic hypotension: Secondary | ICD-10-CM | POA: Diagnosis not present

## 2013-03-03 DIAGNOSIS — R42 Dizziness and giddiness: Secondary | ICD-10-CM | POA: Diagnosis not present

## 2013-03-03 DIAGNOSIS — M25519 Pain in unspecified shoulder: Secondary | ICD-10-CM | POA: Diagnosis not present

## 2013-03-03 DIAGNOSIS — E876 Hypokalemia: Secondary | ICD-10-CM | POA: Diagnosis not present

## 2013-03-09 ENCOUNTER — Encounter: Payer: Self-pay | Admitting: Radiation Oncology

## 2013-03-09 ENCOUNTER — Ambulatory Visit
Admission: RE | Admit: 2013-03-09 | Discharge: 2013-03-09 | Disposition: A | Discharge status: Home / Self Care | Payer: Medicare Other | Source: Ambulatory Visit | Attending: Radiation Oncology | Admitting: Radiation Oncology

## 2013-03-09 VITALS — BP 126/90 | HR 112 | Temp 97.4°F | Ht 66.0 in | Wt 107.2 lb

## 2013-03-09 DIAGNOSIS — C099 Malignant neoplasm of tonsil, unspecified: Secondary | ICD-10-CM

## 2013-03-09 MED ORDER — PILOCARPINE HCL 5 MG PO TABS: 5.0000 mg | ORAL_TABLET | Freq: Two times a day (BID) | ORAL | Status: DC

## 2013-03-09 NOTE — Progress Notes (Signed)
Ms. 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   She denies any sore throat, but c/o dry mouth which makes it more difficult to eat, but she denies any thickened saliva.  Her pulse is elevated today at 111 sitting and 112 standing, but she does not demonstrate orthostatic pressures.  Recent onset of vertigo with nausea and the inability to stand prior to ED visit on 02/28/13.  She is currently using Meclizine for vertigo and is on Remeron for appetite.

## 2013-03-09 NOTE — Progress Notes (Signed)
Department of Radiation Oncology  Phone:  [phone] Fax:        [phone]   Name: YOUSRA Bowen MRN: [id number]  DOB: 11/19/1945  Date: 03/09/2013  Follow Up Visit Note  Diagnosis: T2N0 some of the right tonsil status post radiation alone completed 09/27/2012 now 5 months out from treatment  Interval History: Courtney Bowen presents today for routine followup.  She was hospitalized last week for complaints of vertigo. 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 She has an appointment with Dr. Earle Gell for evaluation of this. On her MRI of the brain there were also some unidentified right objects with a broad differential. She has been placed on Antivert and the vertigo symptoms have resolved. She is also on Remeron help her appetite. She can't really see that helping her appetite but she is sleeping better. She continues to complain of dry mouth. She states this is why she can't eat because everything turns to dust in her mouth. She would like to try Salagen. She has an appointment Dr. Redmond Baseman in April. She is following up with Dr. Felipa Eth from her primary care physician standpoint. She has no new lumps or bumps. No difficulty swallowing.  Allergies:  Allergies  Allergen Reactions  . Amoxicillin Nausea And Vomiting    It makes her very sick  . Erythromycin Nausea And Vomiting    Medications:  Current Outpatient Prescriptions  Medication Sig Dispense Refill  . atorvastatin (LIPITOR) 10 MG tablet Take 10 mg by mouth daily.      . cholecalciferol (VITAMIN D) 400 UNITS TABS Take 400 Units by mouth daily.       . meclizine (ANTIVERT) 12.5 MG tablet Take 1 tablet (12.5 mg total) by mouth 3 (three) times daily as needed for dizziness.  20 tablet  0  . metFORMIN (GLUCOPHAGE) 1000 MG tablet Take 1,000 mg by mouth 2 (two) times daily with a meal.      . mirtazapine (REMERON) 7.5 MG tablet Take 7.5 mg by mouth at bedtime.      Marland Kitchen omeprazole (PRILOSEC) 10 MG capsule Take 10 mg by mouth  daily.      . polyethylene glycol (MIRALAX / GLYCOLAX) packet Take 17 g by mouth daily as needed.       . pilocarpine (SALAGEN) 5 MG tablet Take 1 tablet (5 mg total) by mouth 2 (two) times daily.  60 tablet  1   No current facility-administered medications for this encounter.    Physical Exam:  Filed Vitals:   03/09/13 1323  BP: 126/90  Pulse: 112  Temp:     Filed Weights   03/09/13 1321  Weight: 107 lb 3.2 oz (48.626 kg)   She has lost 5 more pounds since I saw her in September. Which is down about 15 pounds from her weight in June. She has resolving hyperpigmentation of the right neck with a small area of hypopigmentation. Her tongue protrudes at midline. Her oral cavity is benign without evidence of thrush or lesions. I'm not able to visualize or palpate any masses or lesions on her right or left base of tongue or tonsillar fossa. She appears thin. She is alert and oriented x3.  IMPRESSION: Courtney Bowen is a 67 y.o. female status post definitive radiation for an early stage tonsil cancer with complaints of vertigo and a newly diagnosed aneurysm as well as other abnormality seen on recent MRI scan.  PLAN:  I spoke to [place]. She is currently not has no evidence of  her tonsil cancer. I will plan on seeing her back in January. I encouraged her to talk to Dr. Arnoldo Morale about her MRI scan. I encouraged her to followup with her primary care physician regarding her vertigo. I have put in a prescription to [facility] for her Salagen. We discussed this may or may not work and she would know within a month of whether it was going to increase her salivary function. We discussed that this could cause increased fluid production including increased sweats and possibly a vaginal discharge. She would still like to proceed forward with a trial. I again encouraged her by mouth intake.    [name], MD

## 2013-03-17 DIAGNOSIS — R636 Underweight: Secondary | ICD-10-CM | POA: Diagnosis not present

## 2013-03-17 DIAGNOSIS — R42 Dizziness and giddiness: Secondary | ICD-10-CM | POA: Diagnosis not present

## 2013-03-21 ENCOUNTER — Other Ambulatory Visit: Payer: Self-pay | Admitting: Neurosurgery

## 2013-03-21 DIAGNOSIS — I729 Aneurysm of unspecified site: Secondary | ICD-10-CM

## 2013-03-22 ENCOUNTER — Other Ambulatory Visit: Payer: Medicare Other

## 2013-03-30 ENCOUNTER — Encounter: Payer: Self-pay | Admitting: Neurology

## 2013-03-30 ENCOUNTER — Other Ambulatory Visit: Payer: Medicare Other

## 2013-03-31 DIAGNOSIS — Z1231 Encounter for screening mammogram for malignant neoplasm of breast: Secondary | ICD-10-CM | POA: Diagnosis not present

## 2013-03-31 DIAGNOSIS — Z79899 Other long term (current) drug therapy: Secondary | ICD-10-CM | POA: Diagnosis not present

## 2013-03-31 DIAGNOSIS — Z803 Family history of malignant neoplasm of breast: Secondary | ICD-10-CM | POA: Diagnosis not present

## 2013-04-03 ENCOUNTER — Ambulatory Visit: Payer: Medicare Other | Admitting: Neurology

## 2013-04-11 ENCOUNTER — Ambulatory Visit: Payer: Medicare Other | Admitting: Neurology

## 2013-04-24 DIAGNOSIS — Z79899 Other long term (current) drug therapy: Secondary | ICD-10-CM | POA: Diagnosis not present

## 2013-05-02 ENCOUNTER — Ambulatory Visit: Payer: Medicare Other | Admitting: Rehabilitative and Restorative Service Providers"

## 2013-05-03 DIAGNOSIS — Z85819 Personal history of malignant neoplasm of unspecified site of lip, oral cavity, and pharynx: Secondary | ICD-10-CM | POA: Diagnosis not present

## 2013-05-03 DIAGNOSIS — K115 Sialolithiasis: Secondary | ICD-10-CM | POA: Diagnosis not present

## 2013-05-04 ENCOUNTER — Ambulatory Visit
Admission: RE | Admit: 2013-05-04 | Discharge: 2013-05-04 | Disposition: A | Discharge status: Home / Self Care | Payer: Medicare Other | Source: Ambulatory Visit | Attending: Radiation Oncology | Admitting: Radiation Oncology

## 2013-05-04 ENCOUNTER — Other Ambulatory Visit: Payer: Self-pay | Admitting: Radiation Oncology

## 2013-05-04 ENCOUNTER — Encounter: Payer: Self-pay | Admitting: Radiation Oncology

## 2013-05-04 VITALS — BP 169/98 | HR 78 | Temp 97.5°F | Ht 66.0 in | Wt 107.3 lb

## 2013-05-04 DIAGNOSIS — Z09 Encounter for follow-up examination after completed treatment for conditions other than malignant neoplasm: Secondary | ICD-10-CM

## 2013-05-04 DIAGNOSIS — Z923 Personal history of irradiation: Secondary | ICD-10-CM

## 2013-05-04 DIAGNOSIS — C099 Malignant neoplasm of tonsil, unspecified: Secondary | ICD-10-CM | POA: Diagnosis not present

## 2013-05-04 NOTE — Progress Notes (Signed)
Ms. Manninen is here today for assessment s/p radiation therapy for tonsillar cancer.  She reports that she is having difficulty maintaining her weight because she has can't eat protein rich foods, like cheese, meat and dry beans.  She reports that she does have a decrease in dry mouth and has a mild increase in salivary production since using Pilocarpine.   Seen by Dr. Redmond Baseman on 05/03/14.

## 2013-05-05 ENCOUNTER — Other Ambulatory Visit: Payer: Self-pay | Admitting: Otolaryngology

## 2013-05-05 ENCOUNTER — Ambulatory Visit
Admission: RE | Admit: 2013-05-05 | Discharge: 2013-05-05 | Disposition: A | Discharge status: Home / Self Care | Payer: Medicare Other | Source: Ambulatory Visit | Attending: Otolaryngology | Admitting: Otolaryngology

## 2013-05-05 DIAGNOSIS — C099 Malignant neoplasm of tonsil, unspecified: Secondary | ICD-10-CM | POA: Diagnosis not present

## 2013-05-05 DIAGNOSIS — Z923 Personal history of irradiation: Secondary | ICD-10-CM | POA: Insufficient documentation

## 2013-05-05 DIAGNOSIS — C090 Malignant neoplasm of tonsillar fossa: Secondary | ICD-10-CM

## 2013-05-05 DIAGNOSIS — J984 Other disorders of lung: Secondary | ICD-10-CM | POA: Diagnosis not present

## 2013-05-05 NOTE — Progress Notes (Signed)
   Department of Radiation Oncology  Phone:  [phone] Fax:        [phone]   Name: Courtney Bowen MRN: [id number]  DOB: February 08, 1946  Date: 05/04/2013  Follow Up Visit Note  Diagnosis: T2N0 SCCA of the right tonsil treated with RT alone completed 09/27/12  Interval History: [name] presents today for routine followup.  I last saw her in November her weight is stable and actually up a pound. She feels that Salagen is helping her significantly. She has noticed increased sweating but still feels like she benefits (30-40% improvement). Her appetite is coming back somewhat. Her energy levels are improving. She followed up with Dr. Redmond Baseman earlier this week.  Allergies:  Allergies  Allergen Reactions  . Amoxicillin Nausea And Vomiting    It makes her very sick  . Erythromycin Nausea And Vomiting    Medications:  Current Outpatient Prescriptions  Medication Sig Dispense Refill  . atorvastatin (LIPITOR) 10 MG tablet Take 10 mg by mouth daily.      . cholecalciferol (VITAMIN D) 400 UNITS TABS Take 400 Units by mouth daily.       . meclizine (ANTIVERT) 12.5 MG tablet Take 1 tablet (12.5 mg total) by mouth 3 (three) times daily as needed for dizziness.  20 tablet  0  . metFORMIN (GLUCOPHAGE) 1000 MG tablet Take 1,000 mg by mouth 2 (two) times daily with a meal.      . mirtazapine (REMERON) 7.5 MG tablet Take 7.5 mg by mouth at bedtime.      Marland Kitchen omeprazole (PRILOSEC) 10 MG capsule Take 10 mg by mouth daily.      . polyethylene glycol (MIRALAX / GLYCOLAX) packet Take 17 g by mouth daily as needed.       . pilocarpine (SALAGEN) 5 MG tablet TAKE 1 TABLET BY MOUTH TWICE DAILY  60 tablet  0   No current facility-administered medications for this encounter.    Physical Exam:  Filed Vitals:   05/04/13 1414  BP: 169/98  Pulse: 78  Temp: 97.5 F (36.4 C)  Height: 5\' 6"  (1.676 m)  Weight: 107 lb 4.8 oz (48.671 kg)   she has no palpable cervical or subclavicular adenopathy. On oral  examination she has no oral thrush. Her oral cavity is normal and moist. She has no palpable or visible evidence of tumor recurrence in the tonsil or base of tongue.  IMPRESSION: Courtney Bowen is a 68 y.o. female status post definitive radiation to the right tonsil with no evidence of disease  PLAN:  She is continuing her oral care and her swallowing exercises. I've refilled her Salagen. She has a chest x-ray ordered for this month. I will see her back in June. She will see Dr. Redmond Baseman in the interim. I will check a TSH at that time.    [name], MD

## 2013-05-08 ENCOUNTER — Telehealth: Payer: Self-pay | Admitting: *Deleted

## 2013-05-08 NOTE — Telephone Encounter (Signed)
CALLED PATIENT TO ASK ABOUT COMING IN FOR LABS PRIOR TO FU IN June, SPOKE WITH PATIENT AND SHE WILL BE IN ON 10-04-13 AT 8:30 AM FOR THESE LABS

## 2013-05-29 ENCOUNTER — Other Ambulatory Visit: Payer: Self-pay | Admitting: Geriatric Medicine

## 2013-05-29 DIAGNOSIS — R911 Solitary pulmonary nodule: Secondary | ICD-10-CM

## 2013-05-29 DIAGNOSIS — Z79899 Other long term (current) drug therapy: Secondary | ICD-10-CM | POA: Diagnosis not present

## 2013-05-29 DIAGNOSIS — E7800 Pure hypercholesterolemia, unspecified: Secondary | ICD-10-CM | POA: Diagnosis not present

## 2013-05-29 DIAGNOSIS — E119 Type 2 diabetes mellitus without complications: Secondary | ICD-10-CM | POA: Diagnosis not present

## 2013-05-29 DIAGNOSIS — I10 Essential (primary) hypertension: Secondary | ICD-10-CM | POA: Diagnosis not present

## 2013-05-31 ENCOUNTER — Ambulatory Visit
Admission: RE | Admit: 2013-05-31 | Discharge: 2013-05-31 | Disposition: A | Discharge status: Home / Self Care | Payer: Medicare Other | Source: Ambulatory Visit | Attending: Geriatric Medicine | Admitting: Geriatric Medicine

## 2013-05-31 DIAGNOSIS — R911 Solitary pulmonary nodule: Secondary | ICD-10-CM | POA: Diagnosis not present

## 2013-07-05 DIAGNOSIS — Z85819 Personal history of malignant neoplasm of unspecified site of lip, oral cavity, and pharynx: Secondary | ICD-10-CM | POA: Diagnosis not present

## 2013-07-05 DIAGNOSIS — K115 Sialolithiasis: Secondary | ICD-10-CM | POA: Diagnosis not present

## 2013-07-10 DIAGNOSIS — I10 Essential (primary) hypertension: Secondary | ICD-10-CM | POA: Diagnosis not present

## 2013-07-10 DIAGNOSIS — Z79899 Other long term (current) drug therapy: Secondary | ICD-10-CM | POA: Diagnosis not present

## 2013-10-04 ENCOUNTER — Ambulatory Visit
Admission: RE | Admit: 2013-10-04 | Discharge: 2013-10-04 | Disposition: A | Discharge status: Home / Self Care | Payer: Medicare Other | Source: Ambulatory Visit | Attending: Radiation Oncology | Admitting: Radiation Oncology

## 2013-10-04 DIAGNOSIS — Z09 Encounter for follow-up examination after completed treatment for conditions other than malignant neoplasm: Secondary | ICD-10-CM

## 2013-10-04 DIAGNOSIS — Z79899 Other long term (current) drug therapy: Secondary | ICD-10-CM | POA: Insufficient documentation

## 2013-10-04 DIAGNOSIS — C099 Malignant neoplasm of tonsil, unspecified: Secondary | ICD-10-CM | POA: Insufficient documentation

## 2013-10-04 DIAGNOSIS — Z923 Personal history of irradiation: Secondary | ICD-10-CM

## 2013-10-04 LAB — TSH CHCC: TSH: 2.574 m[IU]/L (ref 0.308–3.960)

## 2013-10-05 ENCOUNTER — Ambulatory Visit
Admission: RE | Admit: 2013-10-05 | Discharge: 2013-10-05 | Disposition: A | Discharge status: Home / Self Care | Payer: Medicare Other | Source: Ambulatory Visit | Attending: Radiation Oncology | Admitting: Radiation Oncology

## 2013-10-05 ENCOUNTER — Encounter: Payer: Self-pay | Admitting: Radiation Oncology

## 2013-10-05 VITALS — BP 151/81 | HR 77 | Temp 98.0°F | Resp 20 | Ht 66.0 in | Wt 118.2 lb

## 2013-10-05 DIAGNOSIS — C099 Malignant neoplasm of tonsil, unspecified: Secondary | ICD-10-CM | POA: Diagnosis not present

## 2013-10-05 NOTE — Progress Notes (Signed)
Follow up s/p rad right tonsil, still has dry mouth, didn't take the salgen, no nausea, eats soft foods, still soreness right side jaw , using senosdyne toothpaste with medium toothbrush, eating better though, , lab yesterday TSH=2.574, energy level slight better, in good spirits 1:35 PM

## 2013-10-06 NOTE — Progress Notes (Signed)
   Department of Radiation Oncology  Phone:  [phone] Fax:        [phone]   Name: Courtney Bowen MRN: [id number]  DOB: Apr 14, 1946  Date: 10/05/2013  Follow Up Visit Note  Diagnosis: T2N0 SCCA of the right tonsil treated with RT alone completed 09/27/12 (One year!!)  Interval History: Courtney Bowen presents today for routine followup. She stopped taking the Salagen as she couldn't stand the side effects.  She still mostly feels like she takes a bite or two of food then it turns to dust in her mouth. She carries a bottle of water with her. She has follow up scheduled with neurosurgery for her aneurysm in a year. She still struggles with vertigo. She never figured out what the white spots were on her MRI. She has had no other neurologic symptoms however. She has no sore throat or swelling. She saw Dr. Redmond Baseman in march and will see him again in September. She had a TSH last week that was normal. She follows up regularly with her dentist. She and her sister recently went to [place]. She enjoyed our head and neck cancer patient reception last month. She had a  chest CT in February ordered by her PCP which showed a 1 cm right upper lobe nodule which was stable. She has a follow up scan scheduled in 1 year.   Allergies:  Allergies  Allergen Reactions  . Amoxicillin Nausea And Vomiting    It makes her very sick  . Erythromycin Nausea And Vomiting    Medications:  Current Outpatient Prescriptions  Medication Sig Dispense Refill  . atorvastatin (LIPITOR) 10 MG tablet Take 10 mg by mouth daily.      . cholecalciferol (VITAMIN D) 400 UNITS TABS Take 400 Units by mouth daily.       Marland Kitchen lisinopril (PRINIVIL,ZESTRIL) 5 MG tablet Take 5 mg by mouth daily.      . metFORMIN (GLUCOPHAGE) 1000 MG tablet Take 1,000 mg by mouth 2 (two) times daily with a meal.      . omeprazole (PRILOSEC) 10 MG capsule Take 10 mg by mouth daily.      . polyethylene glycol (MIRALAX / GLYCOLAX) packet Take 17 g by mouth daily as  needed.        No current facility-administered medications for this encounter.    Physical Exam:  Filed Vitals:   10/05/13 1330  BP: 151/81  Pulse: 77  Temp: 98 F (36.7 C)  TempSrc: Oral  Resp: 20  Height: 5\' 6"  (1.676 m)  Weight: 118 lb 3.2 oz (53.615 kg)  SpO2: 100%   she has no palpable cervical or subclavicular adenopathy. On oral examination she has no oral thrush. Her oral cavity is normal and moist. On indirect mirror examination, her base of tongue and tonsils are clear bilaterally. She has no palpable evidence of tumor recurrence in the tonsil or base of tongue. Her weight is up  IMPRESSION: Courtney Bowen is a 68 y.o. female status post definitive radiation to the right tonsil with no evidence of disease  PLAN:   1) Continue oral care and her swallowing exercises.  2) Xerostomia- She declined salagen and has tried in past 3) Weight loss- She is actually gaining weight 4) TSH- Normal 6/15. Recheck in 1 year. 5)Chest imaging- CT 2/15 showed right upper lobe nodule. Follow up scan ordered 2/16 by PCP 6- Aneurysm - Followed by neurosurgery 7) RTC 6 months or sooner PRN.     Courtney Silversmith, MD

## 2013-10-18 DIAGNOSIS — I10 Essential (primary) hypertension: Secondary | ICD-10-CM | POA: Diagnosis not present

## 2013-10-18 DIAGNOSIS — E119 Type 2 diabetes mellitus without complications: Secondary | ICD-10-CM | POA: Diagnosis not present

## 2013-10-18 DIAGNOSIS — R42 Dizziness and giddiness: Secondary | ICD-10-CM | POA: Diagnosis not present

## 2013-10-31 DIAGNOSIS — H25019 Cortical age-related cataract, unspecified eye: Secondary | ICD-10-CM | POA: Diagnosis not present

## 2013-10-31 DIAGNOSIS — H2510 Age-related nuclear cataract, unspecified eye: Secondary | ICD-10-CM | POA: Diagnosis not present

## 2013-10-31 DIAGNOSIS — E119 Type 2 diabetes mellitus without complications: Secondary | ICD-10-CM | POA: Diagnosis not present

## 2013-11-06 DIAGNOSIS — R42 Dizziness and giddiness: Secondary | ICD-10-CM | POA: Diagnosis not present

## 2013-11-06 DIAGNOSIS — I10 Essential (primary) hypertension: Secondary | ICD-10-CM | POA: Diagnosis not present

## 2013-11-06 DIAGNOSIS — E119 Type 2 diabetes mellitus without complications: Secondary | ICD-10-CM | POA: Diagnosis not present

## 2013-11-06 DIAGNOSIS — E46 Unspecified protein-calorie malnutrition: Secondary | ICD-10-CM | POA: Diagnosis not present

## 2013-11-29 ENCOUNTER — Other Ambulatory Visit: Payer: Self-pay | Admitting: Geriatric Medicine

## 2013-11-29 DIAGNOSIS — R911 Solitary pulmonary nodule: Secondary | ICD-10-CM

## 2013-12-05 ENCOUNTER — Ambulatory Visit
Admission: RE | Admit: 2013-12-05 | Discharge: 2013-12-05 | Disposition: A | Discharge status: Home / Self Care | Payer: Medicare Other | Source: Ambulatory Visit | Attending: Geriatric Medicine | Admitting: Geriatric Medicine

## 2013-12-05 DIAGNOSIS — S298XXA Other specified injuries of thorax, initial encounter: Secondary | ICD-10-CM | POA: Diagnosis not present

## 2013-12-05 DIAGNOSIS — R911 Solitary pulmonary nodule: Secondary | ICD-10-CM

## 2013-12-25 DIAGNOSIS — M949 Disorder of cartilage, unspecified: Secondary | ICD-10-CM | POA: Diagnosis not present

## 2013-12-25 DIAGNOSIS — E7800 Pure hypercholesterolemia, unspecified: Secondary | ICD-10-CM | POA: Diagnosis not present

## 2013-12-25 DIAGNOSIS — Z79899 Other long term (current) drug therapy: Secondary | ICD-10-CM | POA: Diagnosis not present

## 2013-12-25 DIAGNOSIS — Z0000 Encounter for general adult medical examination without abnormal findings: Secondary | ICD-10-CM | POA: Diagnosis not present

## 2013-12-25 DIAGNOSIS — Z1331 Encounter for screening for depression: Secondary | ICD-10-CM | POA: Diagnosis not present

## 2013-12-25 DIAGNOSIS — E871 Hypo-osmolality and hyponatremia: Secondary | ICD-10-CM | POA: Diagnosis not present

## 2013-12-25 DIAGNOSIS — M899 Disorder of bone, unspecified: Secondary | ICD-10-CM | POA: Diagnosis not present

## 2013-12-25 DIAGNOSIS — E119 Type 2 diabetes mellitus without complications: Secondary | ICD-10-CM | POA: Diagnosis not present

## 2013-12-26 DIAGNOSIS — Z23 Encounter for immunization: Secondary | ICD-10-CM | POA: Diagnosis not present

## 2014-01-03 DIAGNOSIS — M810 Age-related osteoporosis without current pathological fracture: Secondary | ICD-10-CM | POA: Diagnosis not present

## 2014-01-03 DIAGNOSIS — M899 Disorder of bone, unspecified: Secondary | ICD-10-CM | POA: Diagnosis not present

## 2014-01-03 DIAGNOSIS — M949 Disorder of cartilage, unspecified: Secondary | ICD-10-CM | POA: Diagnosis not present

## 2014-01-08 DIAGNOSIS — E871 Hypo-osmolality and hyponatremia: Secondary | ICD-10-CM | POA: Diagnosis not present

## 2014-01-08 DIAGNOSIS — E119 Type 2 diabetes mellitus without complications: Secondary | ICD-10-CM | POA: Diagnosis not present

## 2014-01-29 DIAGNOSIS — Z23 Encounter for immunization: Secondary | ICD-10-CM | POA: Diagnosis not present

## 2014-02-21 DIAGNOSIS — I671 Cerebral aneurysm, nonruptured: Secondary | ICD-10-CM | POA: Diagnosis not present

## 2014-03-02 DIAGNOSIS — I671 Cerebral aneurysm, nonruptured: Secondary | ICD-10-CM | POA: Diagnosis not present

## 2014-03-16 DIAGNOSIS — I10 Essential (primary) hypertension: Secondary | ICD-10-CM | POA: Diagnosis not present

## 2014-03-16 DIAGNOSIS — I671 Cerebral aneurysm, nonruptured: Secondary | ICD-10-CM | POA: Diagnosis not present

## 2014-04-03 DIAGNOSIS — Z1231 Encounter for screening mammogram for malignant neoplasm of breast: Secondary | ICD-10-CM | POA: Diagnosis not present

## 2014-04-05 ENCOUNTER — Ambulatory Visit
Admission: RE | Admit: 2014-04-05 | Discharge: 2014-04-05 | Disposition: A | Discharge status: Home / Self Care | Payer: Medicare Other | Source: Ambulatory Visit | Attending: Radiation Oncology | Admitting: Radiation Oncology

## 2014-04-05 VITALS — BP 172/80 | HR 82 | Temp 97.5°F | Resp 20 | Wt 121.2 lb

## 2014-04-05 DIAGNOSIS — C099 Malignant neoplasm of tonsil, unspecified: Secondary | ICD-10-CM

## 2014-04-05 NOTE — Progress Notes (Signed)
   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 She stopped taking the Salagen as she couldn't stand the side effects.  She still mostly feels like she takes a bite or two of food then loses taste for it.  She thinks this is getting a little better She carries a bottle of water with her. She had follow up scheduled with neurosurgery in November and was told her aneurysm was stable. No comment on the white spots.   She has no sore throat or swelling. She saw Dr. Redmond Baseman in march and will see him again in September. She had a TSH lin June that was normal.  She follows up regularly with her dentist.   Allergies:  Allergies  Allergen Reactions  . Amoxicillin Nausea And Vomiting    It makes her very sick  . Erythromycin Nausea And Vomiting    Medications:  Current Outpatient Prescriptions  Medication Sig Dispense Refill  . atorvastatin (LIPITOR) 10 MG tablet Take 10 mg by mouth daily.    . cholecalciferol (VITAMIN D) 400 UNITS TABS Take 400 Units by mouth daily.     Marland Kitchen lisinopril (PRINIVIL,ZESTRIL) 5 MG tablet Take 5 mg by mouth daily.    . metFORMIN (GLUCOPHAGE) 1000 MG tablet Take 1,000 mg by mouth 2 (two) times daily with a meal.    . omeprazole (PRILOSEC) 10 MG capsule Take 10 mg by mouth daily.    . polyethylene glycol (MIRALAX / GLYCOLAX) packet Take 17 g by mouth daily as needed.      No current facility-administered medications for this encounter.    Physical Exam:  She has no palpable cervical or subclavicular adenopathy. On oral examination she has no oral thrush. Her oral cavity is normal and moist. On indirect mirror examination, her base of tongue and tonsils are clear  bilaterally. She has no palpable evidence of tumor recurrence in the tonsil or base of tongue. She has hyperpgimentation of the right neck.  IMPRESSION: Courtney Bowen is a 68 y.o. female status post definitive radiation to the right tonsil with no evidence of disease  PLAN:   1) Continue oral care and her swallowing exercises.  2) Xerostomia- She declined salagen and has tried in past. Seems to be improving.  3) Weight loss- She is actually gaining weight. Encouraged her to continue search for foods she likes.  4) TSH- Normal 6/15. Recheck with Dr. Felipa Eth. I have written this down for her.  5)Chest imaging- CT 2/15 showed right upper lobe nodule. Follow up scan ordered 2/16 by PCP 6- Aneurysm - Followed by neurosurgery. Will request MRI from last month.  7) RTC in August/September. Will see Dr. Redmond Baseman in march.     [name], MD

## 2014-04-10 IMAGING — CT NM PET TUM IMG RESTAG (PS) SKULL BASE T - THIGH
1 of 6 series · 1 of 25 positions shown · IV contrast ([ID])
Comparison: 07/20/2012

CLINICAL DATA: Subsequent treatment strategy for right tonsillar
cancer..

NUCLEAR MEDICINE PET WHOLE BODY
Fasting Blood Glucose:  98
TECHNIQUE: 19.3 mCi F-18 FDG was injected intravenously. CT data
was obtained and used for attenuation correction and anatomic
localization only.  (This was not acquired as a diagnostic CT
examination.) Additional exam technical data entered on
technologist worksheet.

[Series 2: ct neck · axial · 3.8mm · 0.98mm/px · 1 of 91 slices shown]
[im 91/91  brain]
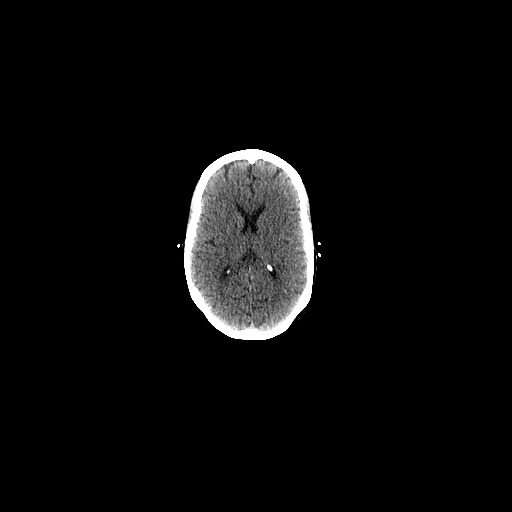

[1 of 25 positions shown; findings below may reference images not displayed]

FINDINGS: Head/Neck:  The right tonsillar hypermetabolism which was seen on
the previous study has resolved completely.  On today's exam, the
uptake identified in the tonsillar regions is symmetric and at
background soft tissue levels.

The 9 mm short-axis right level II lymph node which was described
on the previous study cannot be confidently identified on today's
exam.  There is no evidence for hypermetabolism in the region of
this lymph node as was seen previously.  There is a subtle focus of
mildly increased F D G uptake just medial to the right mandibular
angle, but there appears to be a sialolith in this location and the
F D G activity may be related to chronic change related to the
stone.

Chest:   No hypermetabolic mediastinal or hilar nodes.  No
suspicious pulmonary nodules on the CT scan.  There is
hypermetabolic F D G uptake in the paraspinal fat of the thorax
bilaterally, left slightly more than right.  Given the lack of
associated paraspinal lesions on the CT data, the F D G uptake is
felt to be most likely related to hypermetabolic brown fat.

Lung windows show emphysema.  Scattered bilateral pulmonary nodules
are evident, including a 10 mm spiculated nodule in the right apex.
All of the pulmonary nodules are below resolution by PET imaging
and none showed definite F D G hypermetabolism.

Abdomen/Pelvis:  No abnormal hypermetabolic activity within the
liver, pancreas, adrenal glands, or spleen.  No hypermetabolic
lymph nodes in the abdomen or pelvis.

Pancreatic parenchyma shows diffuse calcification, suggesting
chronic pancreatitis.  Dense atherosclerotic calcification is noted
in the wall of the abdominal aorta without aneurysm.

Skeleton:  No focal hypermetabolic activity to suggest skeletal
metastasis.
IMPRESSION: Resolution of hypermetabolism associated with the right palatine
tonsil seen previously.

The previously identified hypermetabolic right-sided level II lymph
node cannot be identified on today's study.

Mild FDG uptake in the region of the right side at sialolith.  This
is indeterminate and continued attention to this region is
recommended.

Emphysema with bilateral scattered pulmonary parenchymal nodules.
These cannot be well evaluated on today's study given the non
breath hold evaluation of the lung parenchyma.  All of these are
smaller than the accepted size threshold for reliable resolution by
PET imaging.  A dedicated CT chest without contrast is recommended
to further evaluate as follow-up of these nodules is indicated.

## 2014-06-12 ENCOUNTER — Other Ambulatory Visit: Payer: Self-pay | Admitting: Geriatric Medicine

## 2014-06-12 DIAGNOSIS — R911 Solitary pulmonary nodule: Secondary | ICD-10-CM

## 2014-06-18 ENCOUNTER — Other Ambulatory Visit: Payer: Medicare Other

## 2014-06-25 DIAGNOSIS — E119 Type 2 diabetes mellitus without complications: Secondary | ICD-10-CM | POA: Diagnosis not present

## 2014-06-25 DIAGNOSIS — Z79899 Other long term (current) drug therapy: Secondary | ICD-10-CM | POA: Diagnosis not present

## 2014-06-25 DIAGNOSIS — I10 Essential (primary) hypertension: Secondary | ICD-10-CM | POA: Diagnosis not present

## 2014-06-26 ENCOUNTER — Ambulatory Visit
Admission: RE | Admit: 2014-06-26 | Discharge: 2014-06-26 | Disposition: A | Discharge status: Home / Self Care | Payer: Medicare Other | Source: Ambulatory Visit | Attending: Geriatric Medicine | Admitting: Geriatric Medicine

## 2014-06-26 DIAGNOSIS — R918 Other nonspecific abnormal finding of lung field: Secondary | ICD-10-CM | POA: Diagnosis not present

## 2014-06-26 DIAGNOSIS — J432 Centrilobular emphysema: Secondary | ICD-10-CM | POA: Diagnosis not present

## 2014-06-26 DIAGNOSIS — R911 Solitary pulmonary nodule: Secondary | ICD-10-CM | POA: Diagnosis not present

## 2014-10-17 ENCOUNTER — Ambulatory Visit: Payer: Medicare Other | Admitting: Radiation Oncology

## 2014-10-17 DIAGNOSIS — Z85818 Personal history of malignant neoplasm of other sites of lip, oral cavity, and pharynx: Secondary | ICD-10-CM | POA: Diagnosis not present

## 2014-10-17 DIAGNOSIS — R220 Localized swelling, mass and lump, head: Secondary | ICD-10-CM | POA: Diagnosis not present

## 2014-10-17 DIAGNOSIS — R1310 Dysphagia, unspecified: Secondary | ICD-10-CM | POA: Diagnosis not present

## 2014-10-17 DIAGNOSIS — K117 Disturbances of salivary secretion: Secondary | ICD-10-CM | POA: Diagnosis not present

## 2014-11-01 DIAGNOSIS — H25013 Cortical age-related cataract, bilateral: Secondary | ICD-10-CM | POA: Diagnosis not present

## 2014-11-01 DIAGNOSIS — H2513 Age-related nuclear cataract, bilateral: Secondary | ICD-10-CM | POA: Diagnosis not present

## 2014-11-01 DIAGNOSIS — E119 Type 2 diabetes mellitus without complications: Secondary | ICD-10-CM | POA: Diagnosis not present

## 2014-12-06 ENCOUNTER — Encounter: Payer: Self-pay | Admitting: Radiation Oncology

## 2014-12-06 ENCOUNTER — Ambulatory Visit
Admission: RE | Admit: 2014-12-06 | Discharge: 2014-12-06 | Disposition: A | Discharge status: Home / Self Care | Payer: Medicare Other | Source: Ambulatory Visit | Attending: Radiation Oncology | Admitting: Radiation Oncology

## 2014-12-06 VITALS — BP 130/74 | HR 76 | Temp 98.3°F | Resp 16 | Ht 66.0 in | Wt 121.6 lb

## 2014-12-06 DIAGNOSIS — C099 Malignant neoplasm of tonsil, unspecified: Secondary | ICD-10-CM

## 2014-12-06 NOTE — Progress Notes (Signed)
Courtney Bowen here for follow up.  She denies pain.  She reports that her mouth is dry and says foods taste "strange."  She denies trouble swallowing.  She reports having a very poor appetite and is only eating ice cream.  Her weight is stable from December.  She reports having swelling on the right side of her mouth.  She said it has been there for a couple of months and is not sore.  On inspection, there is a swollen area on the right side of her mouth.  She saw Dr. Erik Obey in July and who said it was from biting her jaw.  She reports fatigue.  BP 130/74 mmHg  Pulse 76  Temp(Src) 98.3 F (36.8 C) (Oral)  Resp 16  Ht '5\' 6"'$  (1.676 m)  Wt 121 lb 9.6 oz (55.157 kg)  BMI 19.64 kg/m2  SpO2 100%   Wt Readings from Last 3 Encounters:  12/06/14 121 lb 9.6 oz (55.157 kg)  04/05/14 121 lb 3.2 oz (54.976 kg)  10/05/13 118 lb 3.2 oz (53.615 kg)

## 2014-12-06 NOTE — Progress Notes (Signed)
   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 She stopped taking the Salagen as she couldn't stand the side effects.  She still mostly feels like she takes a bite or two of food then loses taste for it.She is mostly eating ice cream. She had some swelling in her right cheek and saw Dr. Erik Obey who felt this was benign. She states this is stable from last month.  It is not painful and she does not recall trauma to this area. She has not had a TSH drawn but states her energy levels are stable. Her CT in March showed her lung nodule was getting smaller.   Allergies:  Allergies  Allergen Reactions  . Amoxicillin Nausea And Vomiting    It makes her very sick  . Erythromycin Nausea And Vomiting    Medications:  Current Outpatient Prescriptions  Medication Sig Dispense Refill  . atorvastatin (LIPITOR) 10 MG tablet Take 10 mg by mouth daily.    . cholecalciferol (VITAMIN D) 400 UNITS TABS Take 400 Units by mouth daily.     Marland Kitchen lisinopril (PRINIVIL,ZESTRIL) 5 MG tablet Take 5 mg by mouth daily.    . metFORMIN (GLUCOPHAGE) 1000 MG tablet Take 1,000 mg by mouth 2 (two) times daily with a meal.    . omeprazole (PRILOSEC) 10 MG capsule Take 10 mg by mouth daily.    . polyethylene glycol (MIRALAX / GLYCOLAX) packet Take 17 g by mouth daily as needed.      No current facility-administered medications for this encounter.    Physical Exam:  She has no palpable cervical or subclavicular adenopathy. On oral examination she has no oral thrush. Her oral cavity is normal and moist. On indirect mirror examination, her base of tongue and tonsils are clear bilaterally. She has no palpable evidence of  tumor recurrence in the tonsil or base of tongue. She does have a 2 cm area of redundant mucosa and swelling in her lateral right cheek and also in her right retromolar trigone. This is soft and pliable  With no ulceration or lesions. She has hyperpigmentation of the right neck.  IMPRESSION: Courtney Bowen is a 69 y.o. female status post definitive radiation to the right tonsil with swelling of the right cheek/retromolar trigone.   PLAN:  I discussed with Dr. Redmond Baseman and would like for him to take a look. His office will call with appointment this week or next.  I do not feel a scan will be helpful in visulaziation as we can clearly see this are aon physical exam.  1) Continue oral care and her swallowing exercises.  2) Xerostomia- She declined salagen and has tried in past. Seems to be improving.  3) Weight loss- She is actually gaining weight. Encouraged her to continue search for foods she likes.  4) TSH- Normal 6/15. Recheck with Dr. Felipa Eth at her next follow up.  5)Chest imaging- CT 3/16 showed right upper lobe nodule is stable.  6- Aneurysm - Followed by neurosurgery. Last MRI stable. No intervention required.  7) RTC in 6 months. Will see Dr. Redmond Baseman next week.      [name], MD

## 2014-12-11 DIAGNOSIS — Z85819 Personal history of malignant neoplasm of unspecified site of lip, oral cavity, and pharynx: Secondary | ICD-10-CM | POA: Diagnosis not present

## 2014-12-11 DIAGNOSIS — R220 Localized swelling, mass and lump, head: Secondary | ICD-10-CM | POA: Diagnosis not present

## 2014-12-11 DIAGNOSIS — R682 Dry mouth, unspecified: Secondary | ICD-10-CM | POA: Diagnosis not present

## 2014-12-11 DIAGNOSIS — Z923 Personal history of irradiation: Secondary | ICD-10-CM | POA: Diagnosis not present

## 2015-01-07 DIAGNOSIS — E780 Pure hypercholesterolemia: Secondary | ICD-10-CM | POA: Diagnosis not present

## 2015-01-07 DIAGNOSIS — Z79899 Other long term (current) drug therapy: Secondary | ICD-10-CM | POA: Diagnosis not present

## 2015-01-07 DIAGNOSIS — Z7189 Other specified counseling: Secondary | ICD-10-CM | POA: Diagnosis not present

## 2015-01-07 DIAGNOSIS — Z1389 Encounter for screening for other disorder: Secondary | ICD-10-CM | POA: Diagnosis not present

## 2015-01-07 DIAGNOSIS — E119 Type 2 diabetes mellitus without complications: Secondary | ICD-10-CM | POA: Diagnosis not present

## 2015-01-07 DIAGNOSIS — I10 Essential (primary) hypertension: Secondary | ICD-10-CM | POA: Diagnosis not present

## 2015-01-07 DIAGNOSIS — Z0000 Encounter for general adult medical examination without abnormal findings: Secondary | ICD-10-CM | POA: Diagnosis not present

## 2015-01-07 DIAGNOSIS — D649 Anemia, unspecified: Secondary | ICD-10-CM | POA: Diagnosis not present

## 2015-01-07 DIAGNOSIS — R5383 Other fatigue: Secondary | ICD-10-CM | POA: Diagnosis not present

## 2015-02-20 DIAGNOSIS — Z23 Encounter for immunization: Secondary | ICD-10-CM | POA: Diagnosis not present

## 2015-02-28 ENCOUNTER — Other Ambulatory Visit: Payer: Self-pay | Admitting: Otolaryngology

## 2015-02-28 ENCOUNTER — Ambulatory Visit
Admission: RE | Admit: 2015-02-28 | Discharge: 2015-02-28 | Disposition: A | Discharge status: Home / Self Care | Payer: Medicare Other | Source: Ambulatory Visit | Attending: Otolaryngology | Admitting: Otolaryngology

## 2015-02-28 DIAGNOSIS — Z85819 Personal history of malignant neoplasm of unspecified site of lip, oral cavity, and pharynx: Secondary | ICD-10-CM | POA: Diagnosis not present

## 2015-02-28 DIAGNOSIS — R05 Cough: Secondary | ICD-10-CM

## 2015-02-28 DIAGNOSIS — R220 Localized swelling, mass and lump, head: Secondary | ICD-10-CM | POA: Diagnosis not present

## 2015-02-28 DIAGNOSIS — R053 Chronic cough: Secondary | ICD-10-CM

## 2015-03-29 DIAGNOSIS — R05 Cough: Secondary | ICD-10-CM | POA: Diagnosis not present

## 2015-03-29 DIAGNOSIS — Z85819 Personal history of malignant neoplasm of unspecified site of lip, oral cavity, and pharynx: Secondary | ICD-10-CM | POA: Diagnosis not present

## 2015-04-04 DIAGNOSIS — Z803 Family history of malignant neoplasm of breast: Secondary | ICD-10-CM | POA: Diagnosis not present

## 2015-04-04 DIAGNOSIS — Z1231 Encounter for screening mammogram for malignant neoplasm of breast: Secondary | ICD-10-CM | POA: Diagnosis not present

## 2015-05-08 DIAGNOSIS — R05 Cough: Secondary | ICD-10-CM | POA: Diagnosis not present

## 2015-05-08 DIAGNOSIS — Z85819 Personal history of malignant neoplasm of unspecified site of lip, oral cavity, and pharynx: Secondary | ICD-10-CM | POA: Diagnosis not present

## 2015-06-27 ENCOUNTER — Other Ambulatory Visit: Payer: Self-pay | Admitting: Geriatric Medicine

## 2015-06-27 DIAGNOSIS — R911 Solitary pulmonary nodule: Secondary | ICD-10-CM

## 2015-07-02 ENCOUNTER — Ambulatory Visit
Admission: RE | Admit: 2015-07-02 | Discharge: 2015-07-02 | Disposition: A | Discharge status: Home / Self Care | Payer: Medicare Other | Source: Ambulatory Visit | Attending: Geriatric Medicine | Admitting: Geriatric Medicine

## 2015-07-02 DIAGNOSIS — R918 Other nonspecific abnormal finding of lung field: Secondary | ICD-10-CM | POA: Diagnosis not present

## 2015-07-02 DIAGNOSIS — R911 Solitary pulmonary nodule: Secondary | ICD-10-CM

## 2015-07-08 DIAGNOSIS — J439 Emphysema, unspecified: Secondary | ICD-10-CM | POA: Diagnosis not present

## 2015-07-08 DIAGNOSIS — E7800 Pure hypercholesterolemia, unspecified: Secondary | ICD-10-CM | POA: Diagnosis not present

## 2015-07-08 DIAGNOSIS — I700 Atherosclerosis of aorta: Secondary | ICD-10-CM | POA: Diagnosis not present

## 2015-07-08 DIAGNOSIS — Z79899 Other long term (current) drug therapy: Secondary | ICD-10-CM | POA: Diagnosis not present

## 2015-07-08 DIAGNOSIS — R05 Cough: Secondary | ICD-10-CM | POA: Diagnosis not present

## 2015-07-08 DIAGNOSIS — E119 Type 2 diabetes mellitus without complications: Secondary | ICD-10-CM | POA: Diagnosis not present

## 2015-07-08 DIAGNOSIS — Z7984 Long term (current) use of oral hypoglycemic drugs: Secondary | ICD-10-CM | POA: Diagnosis not present

## 2015-07-08 DIAGNOSIS — I10 Essential (primary) hypertension: Secondary | ICD-10-CM | POA: Diagnosis not present

## 2015-07-25 DIAGNOSIS — J439 Emphysema, unspecified: Secondary | ICD-10-CM | POA: Diagnosis not present

## 2015-08-05 DIAGNOSIS — I10 Essential (primary) hypertension: Secondary | ICD-10-CM | POA: Diagnosis not present

## 2015-08-05 DIAGNOSIS — R05 Cough: Secondary | ICD-10-CM | POA: Diagnosis not present

## 2015-08-05 DIAGNOSIS — Z79899 Other long term (current) drug therapy: Secondary | ICD-10-CM | POA: Diagnosis not present

## 2015-09-04 DIAGNOSIS — Z79899 Other long term (current) drug therapy: Secondary | ICD-10-CM | POA: Diagnosis not present

## 2015-11-04 DIAGNOSIS — C099 Malignant neoplasm of tonsil, unspecified: Secondary | ICD-10-CM | POA: Diagnosis not present

## 2015-11-05 DIAGNOSIS — H2513 Age-related nuclear cataract, bilateral: Secondary | ICD-10-CM | POA: Diagnosis not present

## 2015-11-05 DIAGNOSIS — Z0100 Encounter for examination of eyes and vision without abnormal findings: Secondary | ICD-10-CM | POA: Diagnosis not present

## 2015-11-05 DIAGNOSIS — H25013 Cortical age-related cataract, bilateral: Secondary | ICD-10-CM | POA: Diagnosis not present

## 2016-01-29 DIAGNOSIS — Z23 Encounter for immunization: Secondary | ICD-10-CM | POA: Diagnosis not present

## 2016-03-09 DIAGNOSIS — Z1389 Encounter for screening for other disorder: Secondary | ICD-10-CM | POA: Diagnosis not present

## 2016-03-09 DIAGNOSIS — Z7189 Other specified counseling: Secondary | ICD-10-CM | POA: Diagnosis not present

## 2016-03-09 DIAGNOSIS — Z0000 Encounter for general adult medical examination without abnormal findings: Secondary | ICD-10-CM | POA: Diagnosis not present

## 2016-04-06 DIAGNOSIS — Z1231 Encounter for screening mammogram for malignant neoplasm of breast: Secondary | ICD-10-CM | POA: Diagnosis not present

## 2016-07-02 ENCOUNTER — Other Ambulatory Visit: Payer: Self-pay | Admitting: Geriatric Medicine

## 2016-07-02 DIAGNOSIS — R911 Solitary pulmonary nodule: Secondary | ICD-10-CM

## 2016-07-06 ENCOUNTER — Ambulatory Visit
Admission: RE | Admit: 2016-07-06 | Discharge: 2016-07-06 | Disposition: A | Discharge status: Home / Self Care | Payer: Medicare Other | Source: Ambulatory Visit | Attending: Geriatric Medicine | Admitting: Geriatric Medicine

## 2016-07-06 DIAGNOSIS — R911 Solitary pulmonary nodule: Secondary | ICD-10-CM

## 2016-07-06 DIAGNOSIS — R918 Other nonspecific abnormal finding of lung field: Secondary | ICD-10-CM | POA: Diagnosis not present

## 2016-08-11 DIAGNOSIS — D649 Anemia, unspecified: Secondary | ICD-10-CM | POA: Diagnosis not present

## 2016-08-17 DIAGNOSIS — E611 Iron deficiency: Secondary | ICD-10-CM | POA: Diagnosis not present

## 2016-08-19 ENCOUNTER — Encounter: Payer: Self-pay | Admitting: Internal Medicine

## 2016-08-21 DIAGNOSIS — K219 Gastro-esophageal reflux disease without esophagitis: Secondary | ICD-10-CM | POA: Diagnosis not present

## 2016-08-21 DIAGNOSIS — D500 Iron deficiency anemia secondary to blood loss (chronic): Secondary | ICD-10-CM | POA: Diagnosis not present

## 2016-09-08 DIAGNOSIS — E441 Mild protein-calorie malnutrition: Secondary | ICD-10-CM | POA: Diagnosis not present

## 2016-09-08 DIAGNOSIS — E119 Type 2 diabetes mellitus without complications: Secondary | ICD-10-CM | POA: Diagnosis not present

## 2016-09-08 DIAGNOSIS — I10 Essential (primary) hypertension: Secondary | ICD-10-CM | POA: Diagnosis not present

## 2016-09-08 DIAGNOSIS — D500 Iron deficiency anemia secondary to blood loss (chronic): Secondary | ICD-10-CM | POA: Diagnosis not present

## 2016-09-23 ENCOUNTER — Ambulatory Visit: Payer: Medicare Other | Admitting: Internal Medicine

## 2016-09-28 DIAGNOSIS — E119 Type 2 diabetes mellitus without complications: Secondary | ICD-10-CM | POA: Diagnosis not present

## 2016-09-28 DIAGNOSIS — I10 Essential (primary) hypertension: Secondary | ICD-10-CM | POA: Diagnosis not present

## 2016-09-28 DIAGNOSIS — D500 Iron deficiency anemia secondary to blood loss (chronic): Secondary | ICD-10-CM | POA: Diagnosis not present

## 2016-09-28 DIAGNOSIS — J439 Emphysema, unspecified: Secondary | ICD-10-CM | POA: Diagnosis not present

## 2016-10-13 ENCOUNTER — Other Ambulatory Visit: Payer: Self-pay | Admitting: Gastroenterology

## 2016-10-13 DIAGNOSIS — K6389 Other specified diseases of intestine: Secondary | ICD-10-CM

## 2016-10-13 DIAGNOSIS — C189 Malignant neoplasm of colon, unspecified: Secondary | ICD-10-CM | POA: Diagnosis not present

## 2016-10-13 DIAGNOSIS — D123 Benign neoplasm of transverse colon: Secondary | ICD-10-CM | POA: Diagnosis not present

## 2016-10-13 DIAGNOSIS — C183 Malignant neoplasm of hepatic flexure: Secondary | ICD-10-CM | POA: Diagnosis not present

## 2016-10-13 DIAGNOSIS — D509 Iron deficiency anemia, unspecified: Secondary | ICD-10-CM | POA: Diagnosis not present

## 2016-10-13 DIAGNOSIS — K2930 Chronic superficial gastritis without bleeding: Secondary | ICD-10-CM | POA: Diagnosis not present

## 2016-10-14 ENCOUNTER — Ambulatory Visit
Admission: RE | Admit: 2016-10-14 | Discharge: 2016-10-14 | Disposition: A | Discharge status: Home / Self Care | Payer: Medicare Other | Source: Ambulatory Visit | Attending: Gastroenterology | Admitting: Gastroenterology

## 2016-10-14 DIAGNOSIS — K6389 Other specified diseases of intestine: Secondary | ICD-10-CM

## 2016-10-14 DIAGNOSIS — R911 Solitary pulmonary nodule: Secondary | ICD-10-CM | POA: Diagnosis not present

## 2016-10-14 MED ORDER — IOPAMIDOL (ISOVUE-300) INJECTION 61%: 100.0000 mL | Freq: Once | INTRAVENOUS | Status: DC | PRN

## 2016-10-19 ENCOUNTER — Other Ambulatory Visit: Payer: Self-pay | Admitting: General Surgery

## 2016-10-19 DIAGNOSIS — C182 Malignant neoplasm of ascending colon: Secondary | ICD-10-CM | POA: Diagnosis not present

## 2016-10-20 DIAGNOSIS — C184 Malignant neoplasm of transverse colon: Secondary | ICD-10-CM | POA: Diagnosis not present

## 2016-10-20 DIAGNOSIS — K639 Disease of intestine, unspecified: Secondary | ICD-10-CM | POA: Diagnosis not present

## 2016-10-20 DIAGNOSIS — C189 Malignant neoplasm of colon, unspecified: Secondary | ICD-10-CM | POA: Diagnosis not present

## 2016-10-20 DIAGNOSIS — K2930 Chronic superficial gastritis without bleeding: Secondary | ICD-10-CM | POA: Diagnosis not present

## 2016-10-20 DIAGNOSIS — D123 Benign neoplasm of transverse colon: Secondary | ICD-10-CM | POA: Diagnosis not present

## 2016-10-26 DIAGNOSIS — Z01818 Encounter for other preprocedural examination: Secondary | ICD-10-CM | POA: Diagnosis not present

## 2016-10-26 DIAGNOSIS — Z8639 Personal history of other endocrine, nutritional and metabolic disease: Secondary | ICD-10-CM | POA: Diagnosis not present

## 2016-10-26 DIAGNOSIS — C184 Malignant neoplasm of transverse colon: Secondary | ICD-10-CM | POA: Diagnosis not present

## 2016-10-27 DIAGNOSIS — K861 Other chronic pancreatitis: Secondary | ICD-10-CM | POA: Diagnosis not present

## 2016-10-27 DIAGNOSIS — R918 Other nonspecific abnormal finding of lung field: Secondary | ICD-10-CM | POA: Diagnosis not present

## 2016-10-30 DIAGNOSIS — C184 Malignant neoplasm of transverse colon: Secondary | ICD-10-CM | POA: Diagnosis not present

## 2016-10-30 DIAGNOSIS — E119 Type 2 diabetes mellitus without complications: Secondary | ICD-10-CM | POA: Diagnosis present

## 2016-10-30 DIAGNOSIS — Z87891 Personal history of nicotine dependence: Secondary | ICD-10-CM | POA: Diagnosis not present

## 2016-10-30 DIAGNOSIS — I671 Cerebral aneurysm, nonruptured: Secondary | ICD-10-CM | POA: Diagnosis present

## 2016-10-30 DIAGNOSIS — D509 Iron deficiency anemia, unspecified: Secondary | ICD-10-CM | POA: Diagnosis present

## 2016-10-30 DIAGNOSIS — Z923 Personal history of irradiation: Secondary | ICD-10-CM | POA: Diagnosis not present

## 2016-10-30 DIAGNOSIS — K219 Gastro-esophageal reflux disease without esophagitis: Secondary | ICD-10-CM | POA: Diagnosis present

## 2016-10-30 DIAGNOSIS — Z7982 Long term (current) use of aspirin: Secondary | ICD-10-CM | POA: Diagnosis not present

## 2016-10-30 DIAGNOSIS — I10 Essential (primary) hypertension: Secondary | ICD-10-CM | POA: Diagnosis present

## 2016-10-30 DIAGNOSIS — C182 Malignant neoplasm of ascending colon: Secondary | ICD-10-CM | POA: Diagnosis not present

## 2016-10-30 DIAGNOSIS — Z85818 Personal history of malignant neoplasm of other sites of lip, oral cavity, and pharynx: Secondary | ICD-10-CM | POA: Diagnosis not present

## 2016-10-30 DIAGNOSIS — E785 Hyperlipidemia, unspecified: Secondary | ICD-10-CM | POA: Diagnosis present

## 2016-11-06 DIAGNOSIS — R140 Abdominal distension (gaseous): Secondary | ICD-10-CM | POA: Diagnosis not present

## 2016-11-06 DIAGNOSIS — K5909 Other constipation: Secondary | ICD-10-CM | POA: Diagnosis not present

## 2016-11-09 DIAGNOSIS — E119 Type 2 diabetes mellitus without complications: Secondary | ICD-10-CM | POA: Diagnosis not present

## 2016-11-09 DIAGNOSIS — I10 Essential (primary) hypertension: Secondary | ICD-10-CM | POA: Diagnosis not present

## 2016-11-09 DIAGNOSIS — C189 Malignant neoplasm of colon, unspecified: Secondary | ICD-10-CM | POA: Diagnosis not present

## 2016-11-12 DIAGNOSIS — E119 Type 2 diabetes mellitus without complications: Secondary | ICD-10-CM | POA: Diagnosis not present

## 2016-11-12 DIAGNOSIS — E7800 Pure hypercholesterolemia, unspecified: Secondary | ICD-10-CM | POA: Diagnosis not present

## 2016-11-12 DIAGNOSIS — Z7984 Long term (current) use of oral hypoglycemic drugs: Secondary | ICD-10-CM | POA: Diagnosis not present

## 2016-11-12 DIAGNOSIS — I10 Essential (primary) hypertension: Secondary | ICD-10-CM | POA: Diagnosis not present

## 2016-11-12 DIAGNOSIS — C189 Malignant neoplasm of colon, unspecified: Secondary | ICD-10-CM | POA: Diagnosis not present

## 2016-11-12 DIAGNOSIS — D500 Iron deficiency anemia secondary to blood loss (chronic): Secondary | ICD-10-CM | POA: Diagnosis not present

## 2016-11-12 DIAGNOSIS — J439 Emphysema, unspecified: Secondary | ICD-10-CM | POA: Diagnosis not present

## 2016-12-01 DIAGNOSIS — C184 Malignant neoplasm of transverse colon: Secondary | ICD-10-CM | POA: Diagnosis not present

## 2016-12-01 DIAGNOSIS — C182 Malignant neoplasm of ascending colon: Secondary | ICD-10-CM | POA: Diagnosis not present

## 2016-12-03 DIAGNOSIS — C184 Malignant neoplasm of transverse colon: Secondary | ICD-10-CM | POA: Diagnosis not present

## 2016-12-03 DIAGNOSIS — C189 Malignant neoplasm of colon, unspecified: Secondary | ICD-10-CM | POA: Diagnosis not present

## 2016-12-16 DIAGNOSIS — H524 Presbyopia: Secondary | ICD-10-CM | POA: Diagnosis not present

## 2016-12-16 DIAGNOSIS — H2513 Age-related nuclear cataract, bilateral: Secondary | ICD-10-CM | POA: Diagnosis not present

## 2016-12-16 DIAGNOSIS — E119 Type 2 diabetes mellitus without complications: Secondary | ICD-10-CM | POA: Diagnosis not present

## 2017-01-28 DIAGNOSIS — Z23 Encounter for immunization: Secondary | ICD-10-CM | POA: Diagnosis not present

## 2017-02-02 DIAGNOSIS — R918 Other nonspecific abnormal finding of lung field: Secondary | ICD-10-CM | POA: Diagnosis not present

## 2017-02-02 DIAGNOSIS — K831 Obstruction of bile duct: Secondary | ICD-10-CM | POA: Diagnosis not present

## 2017-02-02 DIAGNOSIS — Z85038 Personal history of other malignant neoplasm of large intestine: Secondary | ICD-10-CM | POA: Diagnosis not present

## 2017-02-02 DIAGNOSIS — C184 Malignant neoplasm of transverse colon: Secondary | ICD-10-CM | POA: Diagnosis not present

## 2017-02-02 DIAGNOSIS — J439 Emphysema, unspecified: Secondary | ICD-10-CM | POA: Diagnosis not present

## 2017-02-02 DIAGNOSIS — Z08 Encounter for follow-up examination after completed treatment for malignant neoplasm: Secondary | ICD-10-CM | POA: Diagnosis not present

## 2017-02-02 DIAGNOSIS — C182 Malignant neoplasm of ascending colon: Secondary | ICD-10-CM | POA: Diagnosis not present

## 2017-02-02 DIAGNOSIS — K861 Other chronic pancreatitis: Secondary | ICD-10-CM | POA: Diagnosis not present

## 2017-02-02 DIAGNOSIS — Z9049 Acquired absence of other specified parts of digestive tract: Secondary | ICD-10-CM | POA: Diagnosis not present

## 2017-03-09 DIAGNOSIS — Z1389 Encounter for screening for other disorder: Secondary | ICD-10-CM | POA: Diagnosis not present

## 2017-03-09 DIAGNOSIS — Z0000 Encounter for general adult medical examination without abnormal findings: Secondary | ICD-10-CM | POA: Diagnosis not present

## 2017-03-09 DIAGNOSIS — E441 Mild protein-calorie malnutrition: Secondary | ICD-10-CM | POA: Diagnosis not present

## 2017-03-09 DIAGNOSIS — I10 Essential (primary) hypertension: Secondary | ICD-10-CM | POA: Diagnosis not present

## 2017-03-09 DIAGNOSIS — E119 Type 2 diabetes mellitus without complications: Secondary | ICD-10-CM | POA: Diagnosis not present

## 2017-04-07 DIAGNOSIS — Z1231 Encounter for screening mammogram for malignant neoplasm of breast: Secondary | ICD-10-CM | POA: Diagnosis not present

## 2017-05-04 DIAGNOSIS — C184 Malignant neoplasm of transverse colon: Secondary | ICD-10-CM | POA: Diagnosis not present

## 2017-05-04 DIAGNOSIS — E871 Hypo-osmolality and hyponatremia: Secondary | ICD-10-CM | POA: Diagnosis not present

## 2017-05-06 ENCOUNTER — Telehealth: Payer: Self-pay | Admitting: Oncology

## 2017-05-06 ENCOUNTER — Ambulatory Visit
Admission: RE | Admit: 2017-05-06 | Discharge: 2017-05-06 | Disposition: A | Discharge status: Home / Self Care | Payer: Self-pay | Source: Ambulatory Visit | Attending: Oncology | Admitting: Oncology

## 2017-05-06 DIAGNOSIS — C184 Malignant neoplasm of transverse colon: Secondary | ICD-10-CM

## 2017-05-06 NOTE — Progress Notes (Signed)
  Oncology Nurse Navigator Documentation  Navigator Location: [facility] (05/06/17 1035)   )Navigator Encounter Type: Other (05/06/17 1035)    Outside images  (CT CAP 10/14/16) from [facility] ordered to be uploaded to [facility].                     Barriers/Navigation Needs: Coordination of Care (05/06/17 1035)   Interventions: Other (05/06/17 1035)            Acuity: Level 1 (05/06/17 1035)         Time Spent with Patient: 15 (05/06/17 1035)

## 2017-05-06 NOTE — Telephone Encounter (Signed)
Left message on voicemail for patient regarding D/T/Loc/Ph# for appointment

## 2017-07-02 DIAGNOSIS — Z79899 Other long term (current) drug therapy: Secondary | ICD-10-CM | POA: Diagnosis not present

## 2017-07-02 DIAGNOSIS — E222 Syndrome of inappropriate secretion of antidiuretic hormone: Secondary | ICD-10-CM | POA: Diagnosis not present

## 2017-07-07 ENCOUNTER — Other Ambulatory Visit: Payer: Self-pay | Admitting: Geriatric Medicine

## 2017-07-07 DIAGNOSIS — R911 Solitary pulmonary nodule: Secondary | ICD-10-CM

## 2017-07-16 ENCOUNTER — Ambulatory Visit
Admission: RE | Admit: 2017-07-16 | Discharge: 2017-07-16 | Disposition: A | Discharge status: Home / Self Care | Payer: Medicare Other | Source: Ambulatory Visit | Attending: Geriatric Medicine | Admitting: Geriatric Medicine

## 2017-07-16 DIAGNOSIS — R911 Solitary pulmonary nodule: Secondary | ICD-10-CM | POA: Diagnosis not present

## 2017-07-27 ENCOUNTER — Telehealth: Payer: Self-pay | Admitting: Oncology

## 2017-07-27 ENCOUNTER — Inpatient Hospital Stay: Payer: Medicare Other | Attending: Oncology | Admitting: Oncology

## 2017-07-27 VITALS — BP 143/70 | HR 90 | Temp 97.5°F | Resp 20 | Ht 66.0 in | Wt 115.1 lb

## 2017-07-27 DIAGNOSIS — Z85038 Personal history of other malignant neoplasm of large intestine: Secondary | ICD-10-CM | POA: Diagnosis not present

## 2017-07-27 DIAGNOSIS — I10 Essential (primary) hypertension: Secondary | ICD-10-CM | POA: Insufficient documentation

## 2017-07-27 DIAGNOSIS — K861 Other chronic pancreatitis: Secondary | ICD-10-CM | POA: Diagnosis not present

## 2017-07-27 DIAGNOSIS — E861 Hypovolemia: Secondary | ICD-10-CM

## 2017-07-27 DIAGNOSIS — Z9049 Acquired absence of other specified parts of digestive tract: Secondary | ICD-10-CM

## 2017-07-27 DIAGNOSIS — R630 Anorexia: Secondary | ICD-10-CM | POA: Diagnosis not present

## 2017-07-27 DIAGNOSIS — Z87891 Personal history of nicotine dependence: Secondary | ICD-10-CM | POA: Diagnosis not present

## 2017-07-27 DIAGNOSIS — E7800 Pure hypercholesterolemia, unspecified: Secondary | ICD-10-CM | POA: Insufficient documentation

## 2017-07-27 DIAGNOSIS — Z880 Allergy status to penicillin: Secondary | ICD-10-CM | POA: Diagnosis not present

## 2017-07-27 DIAGNOSIS — E119 Type 2 diabetes mellitus without complications: Secondary | ICD-10-CM

## 2017-07-27 DIAGNOSIS — J449 Chronic obstructive pulmonary disease, unspecified: Secondary | ICD-10-CM | POA: Insufficient documentation

## 2017-07-27 DIAGNOSIS — Z881 Allergy status to other antibiotic agents status: Secondary | ICD-10-CM | POA: Insufficient documentation

## 2017-07-27 DIAGNOSIS — R918 Other nonspecific abnormal finding of lung field: Secondary | ICD-10-CM | POA: Diagnosis not present

## 2017-07-27 DIAGNOSIS — K831 Obstruction of bile duct: Secondary | ICD-10-CM | POA: Diagnosis not present

## 2017-07-27 DIAGNOSIS — Z85818 Personal history of malignant neoplasm of other sites of lip, oral cavity, and pharynx: Secondary | ICD-10-CM | POA: Diagnosis not present

## 2017-07-27 DIAGNOSIS — K219 Gastro-esophageal reflux disease without esophagitis: Secondary | ICD-10-CM | POA: Insufficient documentation

## 2017-07-27 DIAGNOSIS — D649 Anemia, unspecified: Secondary | ICD-10-CM | POA: Diagnosis not present

## 2017-07-27 DIAGNOSIS — Z923 Personal history of irradiation: Secondary | ICD-10-CM | POA: Insufficient documentation

## 2017-07-27 DIAGNOSIS — C182 Malignant neoplasm of ascending colon: Secondary | ICD-10-CM | POA: Insufficient documentation

## 2017-07-27 DIAGNOSIS — Z79899 Other long term (current) drug therapy: Secondary | ICD-10-CM

## 2017-07-27 NOTE — Telephone Encounter (Signed)
Appointments scheduled AVS/Calendar printed per 4/2 los

## 2017-07-27 NOTE — Progress Notes (Signed)
[facility] New Patient Consult   Referring MD: Diamantina Monks, [place], [place] [address]   Courtney Bowen 72 y.o.  Dec 21, 1945    Reason for Referral: Colon cancer   HPI: Courtney Bowen reports being diagnosed with anemia by Dr. Felipa Eth in 2018.  Courtney Bowen was referred to Dr.Karki had a colonoscopy on 10/13/2016 revealed a mass at the transverse colon.  A biopsy revealed invasive poorly differentiated adenocarcinoma.  Courtney Bowen was referred to Dr.Mantyh at [facility] and was taken to the operating room for a right colectomy on 10/30/2016.  A large mass was noted in the ascending colon.  There was no evidence of metastatic disease.  The pathology revealed a poorly differentiated invasive adenocarcinoma with focal penetration through the muscularis propria.  0/20 lymph nodes were positive for metastatic disease.  No tumor deposits.  No lymphovascular or perineural invasion.  The resection margins were negative.  The tumor returned MSI-high with loss of MLH1 and PMS2 expression.  MLH1 methylation was detected. Courtney Bowen was referred to Dr. Rigoberto Noel in medical oncology.  Adjuvant therapy was not recommended. Courtney Bowen was last seen in the medical oncology clinic on 05/04/2017.  Surveillance CTs on 02/02/2017 revealed no change in scattered pulmonary nodules.  No evidence of metastatic disease in the abdomen and pelvis.  Chronic calcific pancreatitis and biliary obstruction.  Courtney Bowen decided to transfer Courtney Bowen medical oncology care closer to home.  Past Medical History:  Diagnosis Date  .  G1P0, 1 miscarriage      . Diabetes mellitus without complication   . GERD (gastroesophageal reflux disease)   . Gout   . Hypercholesterolemia   . Hypertension    not on any meds  . Renal insufficiency    history of  . 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 Status post radiation therapy  08/17/11-10/07/12   squamous cell of tonsil  . Submandibular sialolithiasis    right     Past Surgical History:  Procedure Laterality Date  . BREAST BIOPSY     hx of  . CHOLECYSTECTOMY  20 yrs ago   laproscopic    .   Right colectomy 10/30/2016  Medications: Reviewed  Allergies:  Allergies  Allergen Reactions  . Amoxicillin Nausea And Vomiting    It makes Courtney Bowen very sick  . Erythromycin Nausea And Vomiting    Family history: A paternal first cousin had breast cancer in Courtney Bowen 102s.  No other family history of cancer.  Social History:  Courtney Bowen lives alone in [place].  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  Courtney Bowen quit smoking cigarettes in 2014.  Courtney Bowen does not use alcohol.  Courtney Bowen believes that Courtney Bowen received a red cell transfusion in the past.  ROS:   Positives include: Anorexia following completion of head and neck radiation, dry mouth following head neck radiation  A complete ROS was otherwise negative.  Physical Exam:  Blood pressure (!) 143/70, pulse 90, temperature (!) 97.5 F (36.4 C), temperature source Oral, resp. rate 20, height 5' 6"  (1.676 m), weight 115 lb 1.6 oz (52.2 kg), SpO2 100 %.  HEENT: Oropharynx without visible mass, neck without mass, slight soft tissue thickening at the right anterior neck Lungs: Clear bilaterally, no respiratory distress Cardiac: Regular rate and rhythm Abdomen: No hepatosplenomegaly, no mass, nontender  Vascular: No leg edema Lymph nodes: No cervical, supraclavicular, or inguinal nodes.  "Shotty "bilateral axillary nodes Neurologic: Alert and oriented, the motor exam appears intact in  the upper and lower extremities Skin: No rash Musculoskeletal: No spine tenderness   LAB:  CEA on 10/20/2016 2.7 (less than 2.6), 05/04/2017-1.4   Assessment/Plan:   1. 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   Right  tonsil cancer (T2N0), status post primary radiation completed 09/27/2012  3.   Diabetes  4.   Hypertension  5.   History of tobacco use  6.   History of lung nodules-stable CT chest 07/16/2017  7.   Changes of COPD on chest CT   Disposition:   Courtney Bowen was diagnosed with stage II colon cancer in July 2019.  Courtney Bowen went a right colectomy and did not receive adjuvant therapy.  I reviewed the details of the surgical pathology report with Courtney Bowen.  Courtney Bowen has a good prognosis for a long-term disease-free survival.  Courtney Bowen will be scheduled for a surveillance colonoscopy with Dr. Therisa Doyne June.  I do not recommend additional surveillance CTs for colon cancer.  However Courtney Bowen is scheduled to undergo yearly surveillance of the chest with the history of lung nodules and tobacco use.  This can be directed by Dr. Felipa Eth.  We will asked Courtney Bowen to return for a CEA in July.  Courtney Bowen will return for an office visit in 6 months.  50 minutes were spent with the patient today.  The majority of the time was used for counseling and coordination of care.  Betsy Coder, MD  07/27/2017, 3:06 PM

## 2017-07-28 ENCOUNTER — Telehealth: Payer: Self-pay | Admitting: *Deleted

## 2017-07-28 NOTE — Telephone Encounter (Signed)
Called pt, Dr. Benay Spice recommends checking CEA in July 2019. This will be six months from last CEA done at [facility]. Pt agreed to appointment. Message to schedulers.

## 2017-07-29 ENCOUNTER — Telehealth: Payer: Self-pay | Admitting: Oncology

## 2017-07-29 NOTE — Telephone Encounter (Signed)
Scheduled appt per 4/3 sch message - sent reminder letter in the mail with apt date and time.

## 2017-09-24 ENCOUNTER — Ambulatory Visit
Admission: RE | Admit: 2017-09-24 | Discharge: 2017-09-24 | Disposition: A | Discharge status: Home / Self Care | Payer: Medicare Other | Source: Ambulatory Visit | Attending: Internal Medicine | Admitting: Internal Medicine

## 2017-09-24 ENCOUNTER — Other Ambulatory Visit: Payer: Self-pay | Admitting: Internal Medicine

## 2017-09-24 DIAGNOSIS — M5441 Lumbago with sciatica, right side: Secondary | ICD-10-CM

## 2017-09-24 DIAGNOSIS — M545 Low back pain: Secondary | ICD-10-CM | POA: Diagnosis not present

## 2017-10-04 DIAGNOSIS — C182 Malignant neoplasm of ascending colon: Secondary | ICD-10-CM | POA: Diagnosis not present

## 2017-10-04 DIAGNOSIS — Z980 Intestinal bypass and anastomosis status: Secondary | ICD-10-CM | POA: Diagnosis not present

## 2017-10-04 DIAGNOSIS — K5730 Diverticulosis of large intestine without perforation or abscess without bleeding: Secondary | ICD-10-CM | POA: Diagnosis not present

## 2017-10-04 DIAGNOSIS — Z85038 Personal history of other malignant neoplasm of large intestine: Secondary | ICD-10-CM | POA: Diagnosis not present

## 2017-10-04 DIAGNOSIS — Z9889 Other specified postprocedural states: Secondary | ICD-10-CM | POA: Diagnosis not present

## 2017-10-05 ENCOUNTER — Other Ambulatory Visit: Payer: Self-pay | Admitting: Geriatric Medicine

## 2017-10-05 DIAGNOSIS — M549 Dorsalgia, unspecified: Secondary | ICD-10-CM | POA: Diagnosis not present

## 2017-10-05 DIAGNOSIS — I10 Essential (primary) hypertension: Secondary | ICD-10-CM | POA: Diagnosis not present

## 2017-10-05 DIAGNOSIS — I700 Atherosclerosis of aorta: Secondary | ICD-10-CM | POA: Diagnosis not present

## 2017-10-05 DIAGNOSIS — M545 Low back pain, unspecified: Secondary | ICD-10-CM

## 2017-10-05 DIAGNOSIS — E119 Type 2 diabetes mellitus without complications: Secondary | ICD-10-CM | POA: Diagnosis not present

## 2017-10-10 ENCOUNTER — Other Ambulatory Visit: Payer: Self-pay | Admitting: Geriatric Medicine

## 2017-10-10 ENCOUNTER — Ambulatory Visit
Admission: RE | Admit: 2017-10-10 | Discharge: 2017-10-10 | Disposition: A | Discharge status: Home / Self Care | Payer: Medicare Other | Source: Ambulatory Visit | Attending: Geriatric Medicine | Admitting: Geriatric Medicine

## 2017-10-10 DIAGNOSIS — M545 Low back pain, unspecified: Secondary | ICD-10-CM

## 2017-10-10 DIAGNOSIS — M48061 Spinal stenosis, lumbar region without neurogenic claudication: Secondary | ICD-10-CM | POA: Diagnosis not present

## 2017-10-22 DIAGNOSIS — Z681 Body mass index (BMI) 19 or less, adult: Secondary | ICD-10-CM | POA: Diagnosis not present

## 2017-10-22 DIAGNOSIS — M5416 Radiculopathy, lumbar region: Secondary | ICD-10-CM | POA: Diagnosis not present

## 2017-10-22 DIAGNOSIS — I10 Essential (primary) hypertension: Secondary | ICD-10-CM | POA: Diagnosis not present

## 2017-10-22 DIAGNOSIS — M5126 Other intervertebral disc displacement, lumbar region: Secondary | ICD-10-CM | POA: Diagnosis not present

## 2017-10-25 ENCOUNTER — Telehealth: Payer: Self-pay | Admitting: Oncology

## 2017-10-25 NOTE — Telephone Encounter (Signed)
Patient called in to reschedule lab appointment from 7/15 to 7/8 due to upcoming lumbar surgery on 7/15 Per 7/1 phone call

## 2017-11-01 ENCOUNTER — Inpatient Hospital Stay: Payer: Medicare Other | Attending: Oncology

## 2017-11-01 DIAGNOSIS — Z85038 Personal history of other malignant neoplasm of large intestine: Secondary | ICD-10-CM | POA: Insufficient documentation

## 2017-11-01 DIAGNOSIS — D649 Anemia, unspecified: Secondary | ICD-10-CM | POA: Insufficient documentation

## 2017-11-01 DIAGNOSIS — Z9049 Acquired absence of other specified parts of digestive tract: Secondary | ICD-10-CM | POA: Insufficient documentation

## 2017-11-01 DIAGNOSIS — Z923 Personal history of irradiation: Secondary | ICD-10-CM | POA: Insufficient documentation

## 2017-11-01 DIAGNOSIS — C182 Malignant neoplasm of ascending colon: Secondary | ICD-10-CM

## 2017-11-01 LAB — CEA (IN HOUSE-CHCC): CEA (CHCC-IN HOUSE): 1.67 ng/mL (ref 0.00–5.00)

## 2017-11-03 DIAGNOSIS — I671 Cerebral aneurysm, nonruptured: Secondary | ICD-10-CM | POA: Diagnosis not present

## 2017-11-08 ENCOUNTER — Other Ambulatory Visit: Payer: Medicare Other

## 2017-11-19 DIAGNOSIS — I700 Atherosclerosis of aorta: Secondary | ICD-10-CM | POA: Diagnosis not present

## 2017-11-19 DIAGNOSIS — K219 Gastro-esophageal reflux disease without esophagitis: Secondary | ICD-10-CM | POA: Diagnosis not present

## 2017-11-19 DIAGNOSIS — Z79899 Other long term (current) drug therapy: Secondary | ICD-10-CM | POA: Diagnosis not present

## 2017-11-19 DIAGNOSIS — E1169 Type 2 diabetes mellitus with other specified complication: Secondary | ICD-10-CM | POA: Diagnosis not present

## 2017-11-19 DIAGNOSIS — Z1331 Encounter for screening for depression: Secondary | ICD-10-CM | POA: Diagnosis not present

## 2017-11-19 DIAGNOSIS — E222 Syndrome of inappropriate secretion of antidiuretic hormone: Secondary | ICD-10-CM | POA: Diagnosis not present

## 2017-11-19 DIAGNOSIS — E7800 Pure hypercholesterolemia, unspecified: Secondary | ICD-10-CM | POA: Diagnosis not present

## 2017-11-19 DIAGNOSIS — I10 Essential (primary) hypertension: Secondary | ICD-10-CM | POA: Diagnosis not present

## 2017-11-23 DIAGNOSIS — Z79899 Other long term (current) drug therapy: Secondary | ICD-10-CM | POA: Diagnosis not present

## 2017-12-21 DIAGNOSIS — H25013 Cortical age-related cataract, bilateral: Secondary | ICD-10-CM | POA: Diagnosis not present

## 2017-12-21 DIAGNOSIS — H5213 Myopia, bilateral: Secondary | ICD-10-CM | POA: Diagnosis not present

## 2017-12-21 DIAGNOSIS — H2513 Age-related nuclear cataract, bilateral: Secondary | ICD-10-CM | POA: Diagnosis not present

## 2017-12-21 DIAGNOSIS — E119 Type 2 diabetes mellitus without complications: Secondary | ICD-10-CM | POA: Diagnosis not present

## 2017-12-29 DIAGNOSIS — N183 Chronic kidney disease, stage 3 (moderate): Secondary | ICD-10-CM | POA: Diagnosis not present

## 2017-12-29 DIAGNOSIS — I129 Hypertensive chronic kidney disease with stage 1 through stage 4 chronic kidney disease, or unspecified chronic kidney disease: Secondary | ICD-10-CM | POA: Diagnosis not present

## 2017-12-29 DIAGNOSIS — E441 Mild protein-calorie malnutrition: Secondary | ICD-10-CM | POA: Diagnosis not present

## 2017-12-29 DIAGNOSIS — F439 Reaction to severe stress, unspecified: Secondary | ICD-10-CM | POA: Diagnosis not present

## 2017-12-29 DIAGNOSIS — E222 Syndrome of inappropriate secretion of antidiuretic hormone: Secondary | ICD-10-CM | POA: Diagnosis not present

## 2018-01-13 DIAGNOSIS — Z23 Encounter for immunization: Secondary | ICD-10-CM | POA: Diagnosis not present

## 2018-01-25 ENCOUNTER — Telehealth: Payer: Self-pay | Admitting: Oncology

## 2018-01-25 ENCOUNTER — Inpatient Hospital Stay (HOSPITAL_BASED_OUTPATIENT_CLINIC_OR_DEPARTMENT_OTHER): Payer: Medicare Other | Admitting: Oncology

## 2018-01-25 ENCOUNTER — Encounter: Payer: Self-pay | Admitting: Oncology

## 2018-01-25 ENCOUNTER — Inpatient Hospital Stay: Payer: Medicare Other | Attending: Oncology

## 2018-01-25 VITALS — BP 129/71 | HR 71 | Temp 98.1°F | Resp 18 | Ht 66.0 in | Wt 107.8 lb

## 2018-01-25 DIAGNOSIS — Z9049 Acquired absence of other specified parts of digestive tract: Secondary | ICD-10-CM | POA: Diagnosis not present

## 2018-01-25 DIAGNOSIS — R918 Other nonspecific abnormal finding of lung field: Secondary | ICD-10-CM | POA: Diagnosis not present

## 2018-01-25 DIAGNOSIS — I10 Essential (primary) hypertension: Secondary | ICD-10-CM

## 2018-01-25 DIAGNOSIS — Z85038 Personal history of other malignant neoplasm of large intestine: Secondary | ICD-10-CM | POA: Insufficient documentation

## 2018-01-25 DIAGNOSIS — Z85818 Personal history of malignant neoplasm of other sites of lip, oral cavity, and pharynx: Secondary | ICD-10-CM

## 2018-01-25 DIAGNOSIS — E119 Type 2 diabetes mellitus without complications: Secondary | ICD-10-CM

## 2018-01-25 DIAGNOSIS — C182 Malignant neoplasm of ascending colon: Secondary | ICD-10-CM

## 2018-01-25 DIAGNOSIS — J449 Chronic obstructive pulmonary disease, unspecified: Secondary | ICD-10-CM

## 2018-01-25 DIAGNOSIS — Z87891 Personal history of nicotine dependence: Secondary | ICD-10-CM | POA: Insufficient documentation

## 2018-01-25 DIAGNOSIS — Z923 Personal history of irradiation: Secondary | ICD-10-CM

## 2018-01-25 LAB — CEA (IN HOUSE-CHCC): CEA (CHCC-IN HOUSE): 1.51 ng/mL (ref 0.00–5.00)

## 2018-01-25 NOTE — Progress Notes (Signed)
  [facility] OFFICE PROGRESS NOTE   Diagnosis: Colon cancer  INTERVAL HISTORY:   Courtney Bowen returns as scheduled.  She feels well.  She reports a poor appetite since undergoing treatment for tonsil cancer years ago.  No difficulty with bowel function.  She had a surveillance colonoscopy in April (we do not have the report available today).  Objective:  Vital signs in last 24 hours:  Blood pressure 129/71, pulse 71, temperature 98.1 F (36.7 C), temperature source Oral, resp. rate 18, height _0  (1.676 m), weight 107 lb 12.8 oz (48.9 kg), SpO2 100 %.    HEENT: Soft tissue thickening in the anterior neck bilaterally, oropharynx without visible mass, neck without mass Lymphatics: No cervical, supraclavicular, or inguinal nodes.  "Shotty "bilateral axillary nodes. Resp: Lungs clear bilaterally Cardio: Regular rate and rhythm GI: No mass, nontender, no hepatosplenomegaly Vascular: No leg edema   Lab Results:    Lab Results  Component Value Date   CEA1 1.51 01/25/2018      Medications: I have reviewed the patient's current medications.   Assessment/Plan: 1. 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 MSI-high, loss of MLH1 and PMS2 ? MLH1 methylation detected ? Surveillance CT scans at [facility] on 02/02/2017-negative for recurrent colon cancer  2.   Right tonsil cancer (T2N0), status post primary radiation completed 09/27/2012  3.   Diabetes  4.   Hypertension  5.   History of tobacco use  6.   History of lung nodules-stable CT chest 07/16/2017  7.   Changes of COPD on chest CT     Disposition: She is in clinical remission from colon cancer and head and neck cancer.  I encouraged her to follow-up for a head and neck examination with ENT. Courtney Bowen will return for an office visit and CEA in 6 months.  15 minutes were spent with the patient today.  The majority of  the time was used for counseling and coordination of care.  Courtney Coder, MD  01/25/2018  2:17 PM

## 2018-01-25 NOTE — Telephone Encounter (Signed)
Appts scheduled avs/calendar printed per 10/1 los

## 2018-03-11 DIAGNOSIS — E222 Syndrome of inappropriate secretion of antidiuretic hormone: Secondary | ICD-10-CM | POA: Diagnosis not present

## 2018-03-11 DIAGNOSIS — Z1389 Encounter for screening for other disorder: Secondary | ICD-10-CM | POA: Diagnosis not present

## 2018-03-11 DIAGNOSIS — N183 Chronic kidney disease, stage 3 (moderate): Secondary | ICD-10-CM | POA: Diagnosis not present

## 2018-03-11 DIAGNOSIS — I700 Atherosclerosis of aorta: Secondary | ICD-10-CM | POA: Diagnosis not present

## 2018-03-11 DIAGNOSIS — M8588 Other specified disorders of bone density and structure, other site: Secondary | ICD-10-CM | POA: Diagnosis not present

## 2018-03-11 DIAGNOSIS — Z23 Encounter for immunization: Secondary | ICD-10-CM | POA: Diagnosis not present

## 2018-03-11 DIAGNOSIS — I129 Hypertensive chronic kidney disease with stage 1 through stage 4 chronic kidney disease, or unspecified chronic kidney disease: Secondary | ICD-10-CM | POA: Diagnosis not present

## 2018-03-11 DIAGNOSIS — E1169 Type 2 diabetes mellitus with other specified complication: Secondary | ICD-10-CM | POA: Diagnosis not present

## 2018-03-11 DIAGNOSIS — E7800 Pure hypercholesterolemia, unspecified: Secondary | ICD-10-CM | POA: Diagnosis not present

## 2018-03-11 DIAGNOSIS — Z0000 Encounter for general adult medical examination without abnormal findings: Secondary | ICD-10-CM | POA: Diagnosis not present

## 2018-03-11 DIAGNOSIS — K219 Gastro-esophageal reflux disease without esophagitis: Secondary | ICD-10-CM | POA: Diagnosis not present

## 2018-03-11 DIAGNOSIS — Z79899 Other long term (current) drug therapy: Secondary | ICD-10-CM | POA: Diagnosis not present

## 2018-04-08 DIAGNOSIS — Z79899 Other long term (current) drug therapy: Secondary | ICD-10-CM | POA: Diagnosis not present

## 2018-04-11 DIAGNOSIS — Z8262 Family history of osteoporosis: Secondary | ICD-10-CM | POA: Diagnosis not present

## 2018-04-11 DIAGNOSIS — Z780 Asymptomatic menopausal state: Secondary | ICD-10-CM | POA: Diagnosis not present

## 2018-04-11 DIAGNOSIS — M810 Age-related osteoporosis without current pathological fracture: Secondary | ICD-10-CM | POA: Diagnosis not present

## 2018-04-11 DIAGNOSIS — Z1231 Encounter for screening mammogram for malignant neoplasm of breast: Secondary | ICD-10-CM | POA: Diagnosis not present

## 2018-07-12 ENCOUNTER — Other Ambulatory Visit: Payer: Self-pay | Admitting: Geriatric Medicine

## 2018-07-12 DIAGNOSIS — R918 Other nonspecific abnormal finding of lung field: Secondary | ICD-10-CM

## 2018-07-26 ENCOUNTER — Telehealth: Payer: Self-pay | Admitting: *Deleted

## 2018-07-26 NOTE — Telephone Encounter (Signed)
Wants to reschedule 4/2 visit to May. Confirmed this with patient and scheduling message sent.

## 2018-07-28 ENCOUNTER — Inpatient Hospital Stay: Payer: Medicare Other

## 2018-07-28 ENCOUNTER — Inpatient Hospital Stay: Payer: Medicare Other | Admitting: Oncology

## 2018-08-19 ENCOUNTER — Ambulatory Visit
Admission: RE | Admit: 2018-08-19 | Discharge: 2018-08-19 | Disposition: A | Discharge status: Home / Self Care | Payer: Medicare Other | Source: Ambulatory Visit | Attending: Geriatric Medicine | Admitting: Geriatric Medicine

## 2018-08-19 ENCOUNTER — Other Ambulatory Visit: Payer: Self-pay

## 2018-08-19 ENCOUNTER — Other Ambulatory Visit: Payer: Medicare Other

## 2018-08-19 DIAGNOSIS — R918 Other nonspecific abnormal finding of lung field: Secondary | ICD-10-CM

## 2018-08-19 DIAGNOSIS — R911 Solitary pulmonary nodule: Secondary | ICD-10-CM | POA: Diagnosis not present

## 2018-08-19 DIAGNOSIS — R05 Cough: Secondary | ICD-10-CM | POA: Diagnosis not present

## 2018-08-22 ENCOUNTER — Other Ambulatory Visit (HOSPITAL_COMMUNITY): Payer: Self-pay | Admitting: Geriatric Medicine

## 2018-08-22 ENCOUNTER — Inpatient Hospital Stay: Payer: Medicare Other | Attending: Oncology

## 2018-08-22 ENCOUNTER — Other Ambulatory Visit: Payer: Self-pay

## 2018-08-22 ENCOUNTER — Other Ambulatory Visit: Payer: Self-pay | Admitting: Geriatric Medicine

## 2018-08-22 ENCOUNTER — Inpatient Hospital Stay (HOSPITAL_BASED_OUTPATIENT_CLINIC_OR_DEPARTMENT_OTHER): Payer: Medicare Other | Admitting: Oncology

## 2018-08-22 ENCOUNTER — Telehealth: Payer: Self-pay | Admitting: Oncology

## 2018-08-22 VITALS — BP 149/72 | HR 90 | Temp 97.9°F | Resp 18 | Ht 66.0 in | Wt 125.1 lb

## 2018-08-22 DIAGNOSIS — J449 Chronic obstructive pulmonary disease, unspecified: Secondary | ICD-10-CM | POA: Diagnosis not present

## 2018-08-22 DIAGNOSIS — Z87891 Personal history of nicotine dependence: Secondary | ICD-10-CM

## 2018-08-22 DIAGNOSIS — Z9049 Acquired absence of other specified parts of digestive tract: Secondary | ICD-10-CM

## 2018-08-22 DIAGNOSIS — Z923 Personal history of irradiation: Secondary | ICD-10-CM

## 2018-08-22 DIAGNOSIS — E119 Type 2 diabetes mellitus without complications: Secondary | ICD-10-CM | POA: Diagnosis not present

## 2018-08-22 DIAGNOSIS — Z85818 Personal history of malignant neoplasm of other sites of lip, oral cavity, and pharynx: Secondary | ICD-10-CM

## 2018-08-22 DIAGNOSIS — R918 Other nonspecific abnormal finding of lung field: Secondary | ICD-10-CM

## 2018-08-22 DIAGNOSIS — Z85038 Personal history of other malignant neoplasm of large intestine: Secondary | ICD-10-CM

## 2018-08-22 DIAGNOSIS — I10 Essential (primary) hypertension: Secondary | ICD-10-CM | POA: Diagnosis not present

## 2018-08-22 DIAGNOSIS — R911 Solitary pulmonary nodule: Secondary | ICD-10-CM

## 2018-08-22 DIAGNOSIS — C182 Malignant neoplasm of ascending colon: Secondary | ICD-10-CM

## 2018-08-22 DIAGNOSIS — I2510 Atherosclerotic heart disease of native coronary artery without angina pectoris: Secondary | ICD-10-CM | POA: Diagnosis not present

## 2018-08-22 DIAGNOSIS — Z79899 Other long term (current) drug therapy: Secondary | ICD-10-CM

## 2018-08-22 DIAGNOSIS — C184 Malignant neoplasm of transverse colon: Secondary | ICD-10-CM

## 2018-08-22 LAB — CEA (IN HOUSE-CHCC): CEA (CHCC-In House): 1.88 ng/mL (ref 0.00–5.00)

## 2018-08-22 NOTE — Progress Notes (Signed)
[facility] OFFICE PROGRESS NOTE   Diagnosis: Head and neck cancer, colon cancer  INTERVAL HISTORY:   Courtney Bowen returns as scheduled.  She feels well.  No new complaint.  She relates weight gain to a nutrition supplement.  Objective:  Vital signs in last 24 hours:  Blood pressure (!) 149/72, pulse 90, temperature 97.9 F (36.6 C), temperature source Oral, resp. rate 18, height 5' 6" (1.676 m), weight 125 lb 1.6 oz (56.7 kg), SpO2 98 %.    Physical exam-not performed today due to distancing with the COVID pandemic  Lab Results:   Lab Results  Component Value Date   CEA1 1.88 08/22/2018     Imaging:  Ct Chest Wo Contrast  Result Date: 08/19/2018 CLINICAL DATA:  73 year old female with history of treated: And head and neck cancer. Right upper lobe lung nodule. Cough. EXAM: CT CHEST WITHOUT CONTRAST TECHNIQUE: Multidetector CT imaging of the chest was performed following the standard protocol without IV contrast. COMPARISON:  Chest CT without contrast 07/16/2017 and earlier. FINDINGS: Cardiovascular: Calcified aortic atherosclerosis. Calcified coronary artery atherosclerosis. No cardiomegaly or pericardial effusion. Vascular patency is not evaluated in the absence of IV contrast. Mediastinum/Nodes: Stable subcentimeter mediastinal lymph nodes. No hilar lymphadenopathy is evident. Stable and negative visible noncontrast thoracic inlet. Lungs/Pleura: Chronic centrilobular emphysema. Major airways are patent. Mild to moderate chronic apically lung scarring, with some contiguous nodular density in the left lung apex which is not significantly changed since 2017 (series 3, image 25) and only 1-2 mm larger than in 2017. the chronic discontiguous right upper lobe lung nodule is redemonstrated on that same image and measures 7-8 millimeters long axis Chronic subpleural scarring elsewhere in the bilateral upper lobes appears stable, has a benign appearance. Similar chronic  scarring in both lower lobes, including a stable since 2017 9 millimeter area on series 3, image 94. Chronic linear scarring in the left lower lobe contiguous with the costophrenic angle is unchanged. However, there is increasing nodularity in the left lingula over this series of exams. This area was sub solid in 2017 and has not quite doubled in size since last year. See series 3, image 89 stable since last year. The lesion is bilobed and altogether encompasses 10 x 5 x 7 millimeters (AP by transverse by CC). No pleural effusion or other increasing pulmonary opacity. Upper Abdomen: Visible noncontrast liver, spleen, adrenal glands, kidneys, bowel and pancreas are stable. Chronic calcific pancreatitis. Small chronic right upper pole nephrolithiasis. Surgically absent gallbladder. Musculoskeletal: Osteopenia. No acute osseous abnormality identified. IMPRESSION: 1. Chronic Emphysema (ICD10-J43.9) with increasing bilobed left lingula lung nodule since last year, and which was sub-solid in 2017. Consider one of the following: (a) repeat chest CT in 3 months, (b) follow-up PET-CT (probably is large enough for PET evaluation now), or (c) tissue sampling (probably too small for CT-guided biopsy at this point). T His recommendation follows the consensus statement: Guidelines for Management of Incidental Pulmonary Nodules Detected on CT Images: From the Fleischner Society 2017; Radiology 2017; 284:228-243. 2. Other lung nodules and areas of parenchymal scarring have not significantly changed since 2017. 3. Calcified coronary artery and aortic atherosclerosis. Chronic calcific pancreatitis. Mild right nephrolithiasis. These results will be called to the ordering clinician or representative by the Radiologist Assistant, and communication documented in the PACS or zVision Dashboard. Electronically Signed   By: Courtney Bowen M.D.   On: 08/19/2018 08:33    Medications: I have reviewed the patient's current  medications.  Assessment/Plan: 1. 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 MSI-high, loss of MLH1 and PMS2 ? MLH1 methylation detected ? Surveillance [facility] at [facility] on [facility]/02/2017-negative for recurrent colon cancer  2.   Right tonsil cancer (T2N0), status post primary radiation completed 09/27/2012  3.   Diabetes  4.   Hypertension  5.   History of tobacco use  6.   History of lung nodules-stable CT chest 07/16/2017  Increased left lingular nodule, other areas of scarring/nodularity are stable  7.   Changes of COPD on chest CT   Disposition: Courtney Bowen is in clinical remission from head neck cancer and colon cancer.  She has a history of tobacco use.  A left lingular nodule has increased in size compared to a CT last year.  I reviewed the CT images with her.  She was contacted with the CT result by Dr. Felipa Eth.  She is scheduled for a PET scan next week. We discussed the differential diagnosis.  We discussed treatment options if the PET scan finds the nodule to be hypermetabolic.  She will return for office visit and review of the PET scan on 09/02/2018.  Courtney Coder, MD  08/22/2018  1:50 PM

## 2018-08-22 NOTE — Telephone Encounter (Signed)
Scheduled appt per 4/27 los.

## 2018-08-31 ENCOUNTER — Other Ambulatory Visit: Payer: Self-pay

## 2018-08-31 ENCOUNTER — Ambulatory Visit (HOSPITAL_COMMUNITY)
Admission: RE | Admit: 2018-08-31 | Discharge: 2018-08-31 | Disposition: A | Discharge status: Home / Self Care | Payer: Medicare Other | Source: Ambulatory Visit | Attending: Geriatric Medicine | Admitting: Geriatric Medicine

## 2018-08-31 DIAGNOSIS — R911 Solitary pulmonary nodule: Secondary | ICD-10-CM | POA: Diagnosis not present

## 2018-08-31 DIAGNOSIS — K861 Other chronic pancreatitis: Secondary | ICD-10-CM | POA: Insufficient documentation

## 2018-08-31 DIAGNOSIS — J439 Emphysema, unspecified: Secondary | ICD-10-CM | POA: Diagnosis not present

## 2018-08-31 DIAGNOSIS — I2510 Atherosclerotic heart disease of native coronary artery without angina pectoris: Secondary | ICD-10-CM | POA: Diagnosis not present

## 2018-08-31 DIAGNOSIS — I700 Atherosclerosis of aorta: Secondary | ICD-10-CM | POA: Insufficient documentation

## 2018-08-31 LAB — GLUCOSE, CAPILLARY: Glucose-Capillary: 134 mg/dL — ABNORMAL HIGH (ref 70–99)

## 2018-08-31 MED ORDER — FLUDEOXYGLUCOSE F - 18 (FDG) INJECTION
6.1900 | Freq: Once | INTRAVENOUS | Status: AC | PRN
  Administered 2018-08-31: 6.19 via INTRAVENOUS

## 2018-09-02 ENCOUNTER — Telehealth: Payer: Self-pay | Admitting: Nurse Practitioner

## 2018-09-02 ENCOUNTER — Inpatient Hospital Stay: Payer: Medicare Other | Attending: Nurse Practitioner | Admitting: Nurse Practitioner

## 2018-09-02 ENCOUNTER — Other Ambulatory Visit: Payer: Self-pay

## 2018-09-02 ENCOUNTER — Encounter: Payer: Self-pay | Admitting: Nurse Practitioner

## 2018-09-02 VITALS — BP 142/75 | HR 87 | Temp 98.2°F | Resp 18 | Ht 66.0 in | Wt 124.8 lb

## 2018-09-02 DIAGNOSIS — I2510 Atherosclerotic heart disease of native coronary artery without angina pectoris: Secondary | ICD-10-CM | POA: Diagnosis not present

## 2018-09-02 DIAGNOSIS — J449 Chronic obstructive pulmonary disease, unspecified: Secondary | ICD-10-CM | POA: Diagnosis not present

## 2018-09-02 DIAGNOSIS — C184 Malignant neoplasm of transverse colon: Secondary | ICD-10-CM

## 2018-09-02 DIAGNOSIS — Z85818 Personal history of malignant neoplasm of other sites of lip, oral cavity, and pharynx: Secondary | ICD-10-CM

## 2018-09-02 DIAGNOSIS — R918 Other nonspecific abnormal finding of lung field: Secondary | ICD-10-CM | POA: Insufficient documentation

## 2018-09-02 DIAGNOSIS — I10 Essential (primary) hypertension: Secondary | ICD-10-CM | POA: Insufficient documentation

## 2018-09-02 DIAGNOSIS — Z79899 Other long term (current) drug therapy: Secondary | ICD-10-CM

## 2018-09-02 DIAGNOSIS — E119 Type 2 diabetes mellitus without complications: Secondary | ICD-10-CM | POA: Diagnosis not present

## 2018-09-02 DIAGNOSIS — C182 Malignant neoplasm of ascending colon: Secondary | ICD-10-CM

## 2018-09-02 DIAGNOSIS — Z85038 Personal history of other malignant neoplasm of large intestine: Secondary | ICD-10-CM | POA: Diagnosis not present

## 2018-09-02 DIAGNOSIS — J432 Centrilobular emphysema: Secondary | ICD-10-CM

## 2018-09-02 DIAGNOSIS — Z923 Personal history of irradiation: Secondary | ICD-10-CM | POA: Diagnosis not present

## 2018-09-02 NOTE — Progress Notes (Addendum)
Courtney Bowen OFFICE PROGRESS [facility]   Diagnosis: Head and neck cancer, colon cancer  INTERVAL HISTORY:   Courtney Bowen soon returns as scheduled.  She overall feels well.  No complaints.  Objective:  Vital signs in last 24 hours:  Blood pressure (!) 142/75, pulse 87, temperature 98.2 F (36.8 C), temperature source Oral, resp. rate 18, height _0  (1.676 m), weight 124 lb 12.8 oz (56.6 kg), SpO2 100 %.    Physical exam-not performed today due to distancing with the COVID pandemic  Lab Results:  Lab Results  Component Value Date   WBC 4.1 02/28/2013   HGB 10.8 (L) 02/28/2013   HCT 30.2 (L) 02/28/2013   MCV 74.9 (L) 02/28/2013   PLT 201 02/28/2013   NEUTROABS 2.9 02/28/2013    Imaging:  Nm Pet Image Initial (pi) Skull Base To Thigh  Result Date: 08/31/2018 CLINICAL DATA:  Initial treatment strategy for pulmonary nodule. History of squamous cell carcinoma of the right tonsil. EXAM: NUCLEAR MEDICINE PET SKULL BASE TO THIGH TECHNIQUE: 6.2 mCi F-18 FDG was injected intravenously. Full-ring PET imaging was performed from the skull base to thigh after the radiotracer. CT data was obtained and used for attenuation correction and anatomic localization. Fasting blood glucose: 134 mg/dl COMPARISON:  Multiple exams, including 01/09/2013 and CT chest from 08/19/2018 FINDINGS: Mediastinal blood pool activity: SUV max 2.5 NECK: The left parotid gland is somewhat larger than the right and has asymmetrically accentuated activity, maximum SUV 6.8 compared to the right side of 4.8. I do not see a discrete mass in the parotid gland. Relatively symmetric glottic activity is likely physiologic. Incidental CT findings: Bilateral common carotid artery atherosclerotic calcification. CHEST: Accentuated activity at paraspinal adipose tissues in the thoracic spine favors benign hypermetabolic brown fat. In the vicinity of the bilobed lingular nodule and adjacent ground-glass opacity, maximum SUV  is 1.0. There is a peripheral ground-glass opacity in the left upper lobe measuring about 1.1 cm in diameter on image 27/8 with faintly increased metabolic activity, maximum SUV 1.5. In the vicinity of the 0.7 by 0.5 cm right apically pulmonary nodule on image 11/8, there is no significant increased metabolic activity. Other small nodules and ground-glass densities in the lungs likewise demonstrate no significant accentuated metabolic activity. Incidental CT findings: Severe centrilobular emphysema. Scarring in the left lower lobe. Coronary, aortic arch, and branch vessel atherosclerotic vascular disease. ABDOMEN/PELVIS: Retrocrural, paraspinal, and retroperitoneal accentuated activity corresponding to adipose tissue likely represents brown fat. Incidental CT findings: Chronic calcific pancreatitis. Aortoiliac atherosclerotic vascular disease. Chronic intrahepatic and extrahepatic biliary dilatation. SKELETON: The paraspinal brown fat appears continuous with accentuated signal extending along the intercostal space or ribs bilaterally and posteriorly, without appreciable rib lesions on the CT data, favoring a physiologic/brown fat response. The absence of any lesions in the anterior ribs or other bony structures further suggest that this appearance is physiologic and related to the brown fat activation. Incidental CT findings: none IMPRESSION: 1. The lingular lesion of concern on recent chest CT is not hypermetabolic but is at the lower limits of PET sensitivity based on size. Surveillance CT chest in 3-6 months time by CT chest is likely warranted. 2. There is some faintly accentuated activity in a left upper lobe peripheral ground-glass opacity, maximum SUV 1.5. This could be inflammatory or from low-grade adenocarcinoma. Surveillance of this ground-glass opacity is also suggested. 3. There is a notable degree of brown fat activation in the thoracic and lumbar paraspinal regions, retroperitoneum, and along the  posterior ribs/intercostal spaces. Brown fat, while metabolically active, is benign. 4. Other imaging findings of potential clinical significance: Aortic Atherosclerosis (ICD10-I70.0) and Emphysema (ICD10-J43.9). Coronary atherosclerosis. Left lower lobe scarring. Chronic calcific pancreatitis. Chronic intrahepatic and extrahepatic biliary dilatation. Electronically Signed   By: Van Clines M.D.   On: 08/31/2018 12:35    Medications: I have reviewed the patient's current medications.  Assessment/Plan: 1. 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 MSI-high, loss ofMLH1 and PMS2 ? MLH23mthylation detected ? 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  Faintly accentuated activity in a left upper lobe groundglass opacity maximum SUV 1.5.  Left parotid gland somewhat larger than the right and has asymmetrically accentuated activity, no discrete mass in the parotid gland.  7.Changes of COPD on chest CT  Disposition: [name] stable.  The lingular lesion was not hypermetabolic on the recent PET scan.  Another lesion in the left upper lobe showed faintly increased metabolic activity.  There were multiple other lung nodules with no significant increased metabolic activity.  The PET scan report and images were reviewed with Courtney Bowen at today's visit.  She understands the lung nodules could be inflammatory or possibly low-grade  adenocarcinoma.  The plan is to repeat a noncontrast chest CT in 6 months.  She agrees with this plan.  We will see her back a few days after the chest CT to review the results.  She will contact the office in the interim with any problems.  Patient seen with Dr. SBenay Spice    LNed CardANP/GNP-BC   09/02/2018  10:37 AM  This was a shared visit with LNed Card  We reviewed the PET CT images with Courtney Bowen.  The PET scan does not reveal hypermetabolic lung nodules, but it is possible she has slow-growing primary lung tumors that are below the size for PET resolution.  We will repeat a chest CT at a six-month interval.  BJulieanne Manson MD

## 2018-09-02 NOTE — Telephone Encounter (Signed)
Scheduled appt per 5/8 los.  A calendar will be mailed out.

## 2018-09-09 DIAGNOSIS — E1169 Type 2 diabetes mellitus with other specified complication: Secondary | ICD-10-CM | POA: Diagnosis not present

## 2018-09-09 DIAGNOSIS — I129 Hypertensive chronic kidney disease with stage 1 through stage 4 chronic kidney disease, or unspecified chronic kidney disease: Secondary | ICD-10-CM | POA: Diagnosis not present

## 2018-09-09 DIAGNOSIS — E7800 Pure hypercholesterolemia, unspecified: Secondary | ICD-10-CM | POA: Diagnosis not present

## 2018-09-09 DIAGNOSIS — N183 Chronic kidney disease, stage 3 (moderate): Secondary | ICD-10-CM | POA: Diagnosis not present

## 2018-10-04 DIAGNOSIS — H5213 Myopia, bilateral: Secondary | ICD-10-CM | POA: Diagnosis not present

## 2018-10-04 DIAGNOSIS — H2513 Age-related nuclear cataract, bilateral: Secondary | ICD-10-CM | POA: Diagnosis not present

## 2018-10-04 DIAGNOSIS — H524 Presbyopia: Secondary | ICD-10-CM | POA: Diagnosis not present

## 2018-11-18 DIAGNOSIS — Z1389 Encounter for screening for other disorder: Secondary | ICD-10-CM | POA: Diagnosis not present

## 2018-11-18 DIAGNOSIS — N183 Chronic kidney disease, stage 3 (moderate): Secondary | ICD-10-CM | POA: Diagnosis not present

## 2018-11-18 DIAGNOSIS — E7800 Pure hypercholesterolemia, unspecified: Secondary | ICD-10-CM | POA: Diagnosis not present

## 2018-11-18 DIAGNOSIS — Z79899 Other long term (current) drug therapy: Secondary | ICD-10-CM | POA: Diagnosis not present

## 2018-11-18 DIAGNOSIS — I129 Hypertensive chronic kidney disease with stage 1 through stage 4 chronic kidney disease, or unspecified chronic kidney disease: Secondary | ICD-10-CM | POA: Diagnosis not present

## 2018-11-18 DIAGNOSIS — R350 Frequency of micturition: Secondary | ICD-10-CM | POA: Diagnosis not present

## 2018-11-18 DIAGNOSIS — M25361 Other instability, right knee: Secondary | ICD-10-CM | POA: Diagnosis not present

## 2018-11-18 DIAGNOSIS — E222 Syndrome of inappropriate secretion of antidiuretic hormone: Secondary | ICD-10-CM | POA: Diagnosis not present

## 2018-11-18 DIAGNOSIS — E1169 Type 2 diabetes mellitus with other specified complication: Secondary | ICD-10-CM | POA: Diagnosis not present

## 2019-01-14 DIAGNOSIS — Z23 Encounter for immunization: Secondary | ICD-10-CM | POA: Diagnosis not present

## 2019-02-09 ENCOUNTER — Inpatient Hospital Stay: Payer: Medicare Other | Attending: Oncology

## 2019-02-09 ENCOUNTER — Inpatient Hospital Stay (HOSPITAL_BASED_OUTPATIENT_CLINIC_OR_DEPARTMENT_OTHER): Payer: Medicare Other | Admitting: Oncology

## 2019-02-09 ENCOUNTER — Other Ambulatory Visit: Payer: Self-pay

## 2019-02-09 ENCOUNTER — Telehealth: Payer: Self-pay

## 2019-02-09 VITALS — BP 142/84 | HR 89 | Temp 97.8°F | Resp 16 | Wt 124.5 lb

## 2019-02-09 DIAGNOSIS — E119 Type 2 diabetes mellitus without complications: Secondary | ICD-10-CM | POA: Diagnosis not present

## 2019-02-09 DIAGNOSIS — Z85038 Personal history of other malignant neoplasm of large intestine: Secondary | ICD-10-CM | POA: Diagnosis not present

## 2019-02-09 DIAGNOSIS — C182 Malignant neoplasm of ascending colon: Secondary | ICD-10-CM

## 2019-02-09 DIAGNOSIS — I10 Essential (primary) hypertension: Secondary | ICD-10-CM | POA: Diagnosis not present

## 2019-02-09 DIAGNOSIS — Z87891 Personal history of nicotine dependence: Secondary | ICD-10-CM | POA: Diagnosis not present

## 2019-02-09 DIAGNOSIS — C184 Malignant neoplasm of transverse colon: Secondary | ICD-10-CM

## 2019-02-09 DIAGNOSIS — Z85818 Personal history of malignant neoplasm of other sites of lip, oral cavity, and pharynx: Secondary | ICD-10-CM | POA: Diagnosis not present

## 2019-02-09 DIAGNOSIS — Z923 Personal history of irradiation: Secondary | ICD-10-CM | POA: Diagnosis not present

## 2019-02-09 DIAGNOSIS — Z79899 Other long term (current) drug therapy: Secondary | ICD-10-CM | POA: Diagnosis not present

## 2019-02-09 DIAGNOSIS — J449 Chronic obstructive pulmonary disease, unspecified: Secondary | ICD-10-CM | POA: Insufficient documentation

## 2019-02-09 DIAGNOSIS — Z9049 Acquired absence of other specified parts of digestive tract: Secondary | ICD-10-CM | POA: Insufficient documentation

## 2019-02-09 LAB — CEA (IN HOUSE-CHCC): CEA (CHCC-In House): 1.83 ng/mL (ref 0.00–5.00)

## 2019-02-09 NOTE — Telephone Encounter (Signed)
TC to pt per Ned Card NP to let her know CEA is normal, follow-up as scheduled. Patient verbalized understanding. No further problems or concerns at this time.

## 2019-02-09 NOTE — Progress Notes (Signed)
  [facility] OFFICE PROGRESS NOTE   Diagnosis: Head and neck cancer, colon cancer  INTERVAL HISTORY:   Courtney Bowen returns as scheduled.  She feels well.  She continues to have dryness in the mouth.  This limits her diet choices.  She has not undergone a restaging chest CT.  Objective:  Vital signs in last 24 hours:  Blood pressure (!) 142/84, pulse 89, temperature 97.8 F (36.6 C), temperature source Tympanic, resp. rate 16, weight 124 lb 8 oz (56.5 kg), SpO2 100 %.    HEENT: Oropharynx without visible mass, radiation changes at the anterior neck Lymphatics: No cervical, supraclavicular, axillary, or inguinal nodes GI: No hepatosplenomegaly, no mass, nontender Vascular: No leg edema   Lab Results:    Lab Results  Component Value Date   CEA1 1.88 08/22/2018     Medications: I have reviewed the patient's current medications.   Assessment/Plan: 1. 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 MSI-high, loss ofMLH1 and PMS2 ? MLH62mthylation detected ? 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  Faintly accentuated activity in a left upper lobe groundglass opacity maximum SUV 1.5.  Left parotid gland somewhat larger than the right and has asymmetrically accentuated activity, no discrete mass in the parotid gland.  7.Changes of COPD on chest CT    Disposition: Courtney Bowen in remission from colon  cancer and head and neck cancer.  She will schedule a chest CT for within the next few weeks.  We will follow up on the CEA today.  She will return for an office visit in 6 months.  We will recommend she undergo a surveillance colonoscopy.  GBetsy Coder MD  02/09/2019  1:06 PM

## 2019-02-10 ENCOUNTER — Telehealth: Payer: Self-pay | Admitting: Oncology

## 2019-02-10 NOTE — Telephone Encounter (Signed)
I talk with patient regarding schedule  

## 2019-02-14 ENCOUNTER — Other Ambulatory Visit: Payer: Self-pay

## 2019-02-14 ENCOUNTER — Ambulatory Visit (HOSPITAL_COMMUNITY)
Admission: RE | Admit: 2019-02-14 | Discharge: 2019-02-14 | Disposition: A | Discharge status: Home / Self Care | Payer: Medicare Other | Source: Ambulatory Visit | Attending: Nurse Practitioner | Admitting: Nurse Practitioner

## 2019-02-14 ENCOUNTER — Encounter (HOSPITAL_COMMUNITY): Payer: Self-pay

## 2019-02-14 DIAGNOSIS — C184 Malignant neoplasm of transverse colon: Secondary | ICD-10-CM

## 2019-02-14 DIAGNOSIS — R918 Other nonspecific abnormal finding of lung field: Secondary | ICD-10-CM

## 2019-02-14 DIAGNOSIS — C182 Malignant neoplasm of ascending colon: Secondary | ICD-10-CM | POA: Diagnosis not present

## 2019-02-14 HISTORY — DX: Malignant neoplasm of colon, unspecified: C18.9

## 2019-02-15 ENCOUNTER — Telehealth: Payer: Self-pay | Admitting: *Deleted

## 2019-02-15 DIAGNOSIS — R918 Other nonspecific abnormal finding of lung field: Secondary | ICD-10-CM

## 2019-02-15 NOTE — Telephone Encounter (Signed)
Left VM requesting return call to discuss CT results.

## 2019-02-15 NOTE — Telephone Encounter (Addendum)
-----   Message from Ladell Pier, MD sent at 02/14/2019  8:40 PM EDT ----- Please call patient, slight elargement of lung nodule on CT, now new nodules, refer to Dr. Servando Snare or [facility] clinic for further evaluation, Hinton Dyer can help with appt. Patient called back. Was informed of CT results and need for referral. She agrees to referral and is asking to be seen as soon as possible. Notified thoracic navigator to assist w/referral.

## 2019-02-16 ENCOUNTER — Encounter: Payer: Self-pay | Admitting: *Deleted

## 2019-02-16 NOTE — Progress Notes (Signed)
Oncology Nurse Navigator Documentation  Oncology Nurse Navigator Flowsheets 02/16/2019  Navigator Location [facility]  Navigator Encounter Type Other/I received an update from Dr. Gearldine Shown nurse that patient has been referred to see Dr. Servando Snare.  I updated his office on referral.   Barriers/Navigation Needs Coordination of Care  Interventions Coordination of Care  Coordination of Care Other  Acuity Level 2-Minimal Needs (1-2 Barriers Identified)  Time Spent with Patient 15

## 2019-02-20 NOTE — Progress Notes (Deleted)
[address]       [place] [address]             [phone]                    Courtney Bowen [place] Medical Record #[id number] Date of Birth: 01-27-1946  Referring: Ladell Pier, MD Primary Care: Lajean Manes, MD Primary Cardiologist: No primary care provider on file.  Chief Complaint:   No chief complaint on file.   History of Present Illness:    Courtney Bowen 73 y.o. female is seen in the office  today for       Current Activity/ Functional Status:  {functional status:19517}   Zubrod Score: At the time of surgery this patient's most appropriate activity status/level should be described as: []     0    Normal activity, no symptoms []     1    Restricted in physical strenuous activity but ambulatory, able to do out light work []     2    Ambulatory and capable of self care, unable to do work activities, up and about               >50 % of waking hours                              []     3    Only limited self care, in bed greater than 50% of waking hours []     4    Completely disabled, no self care, confined to bed or chair []     5    Moribund   Past Medical History:  Diagnosis Date  . Cancer ([place]) 07/08/12   right tonsil  . Colon cancer ([place]) dx'd 2016  . Diabetes mellitus without complication ([place])   . GERD (gastroesophageal reflux disease)   . Gout   . Hypercholesterolemia   . Hypertension    not on any meds  . Renal insufficiency    history of  . 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 Status post radiation therapy within last four weeks 08/17/11-10/07/12   squamous cell of tonsil  . Submandibular sialolithiasis    right    Past Surgical History:  Procedure Laterality Date  . BREAST BIOPSY     hx of  . CHOLECYSTECTOMY  20 yrs ago   laproscopic    Family History  Problem Relation Age of Onset  . Stroke Mother   . Stroke Father   . Diabetes Brother      Social History   Tobacco Use   Smoking Status Former Smoker  . Packs/day: 1.50  . Years: 45.00  . Pack years: 67.50  . Types: Cigarettes  . Quit date: 07/07/2012  . Years since quitting: 6.6  Tobacco Comment   1-1.5 ppd    Social History   Substance and Sexual Activity  Alcohol Use No   Comment: socially yrs ago     Allergies  Allergen Reactions  . Amoxicillin Nausea And Vomiting    It makes her very sick  . Erythromycin Nausea And Vomiting    Current Outpatient Medications  Medication Sig Dispense Refill  . alendronate (FOSAMAX) 70 MG tablet Take 70 mg by mouth once a week.    Marland Kitchen aspirin EC 81 MG tablet Take by mouth.    Marland Kitchen atorvastatin (LIPITOR) 10 MG  tablet Take 20 mg by mouth daily.     . cholecalciferol (VITAMIN D) 400 UNITS TABS Take 400 Units by mouth daily.     Marland Kitchen losartan (COZAAR) 50 MG tablet Take 50 mg by mouth every evening    . nicotine polacrilex (NICORETTE) 4 MG gum Take by mouth.    Marland Kitchen omeprazole (PRILOSEC) 10 MG capsule Take 10 mg by mouth daily.    . polyethylene glycol (MIRALAX / GLYCOLAX) packet Take 17 g by mouth daily as needed.      No current facility-administered medications for this visit.     {Ros - complete:30496}   Review of Systems:     Cardiac Review of Systems: [Y] = yes  or   [ N ] = no   Chest Pain [    ]  Resting SOB [   ] Exertional SOB  [  ]  Orthopnea [  ]   Pedal Edema [   ]    Palpitations [  ] Syncope  [  ]   Presyncope [   ]   General Review of Systems: [Y] = yes [  ]=no Constitional: recent weight change [  ];  Wt loss over the last 3 months [   ] anorexia [  ]; fatigue [  ]; nausea [  ]; night sweats [  ]; fever [  ]; or chills [  ];           Eye : blurred vision [  ]; diplopia [   ]; vision changes [  ];  Amaurosis fugax[  ]; Resp: cough [  ];  wheezing[  ];  hemoptysis[  ]; shortness of breath[  ]; paroxysmal nocturnal dyspnea[  ]; dyspnea on exertion[  ]; or orthopnea[  ];  GI:  gallstones[  ], vomiting[  ];  dysphagia[  ]; melena[  ];  hematochezia [   ]; heartburn[  ];   Hx of  Colonoscopy[  ]; GU: kidney stones [  ]; hematuria[  ];   dysuria [  ];  nocturia[  ];  history of     obstruction [  ]; urinary frequency [  ]             Skin: rash, swelling[  ];, hair loss[  ];  peripheral edema[  ];  or itching[  ]; Musculosketetal: myalgias[  ];  joint swelling[  ];  joint erythema[  ];  joint pain[  ];  back pain[  ];  Heme/Lymph: bruising[  ];  bleeding[  ];  anemia[  ];  Neuro: TIA[  ];  headaches[  ];  stroke[  ];  vertigo[  ];  seizures[  ];   paresthesias[  ];  difficulty walking[  ];  Psych:depression[  ]; anxiety[  ];  Endocrine: diabetes[  ];  thyroid dysfunction[  ];  Immunizations: Flu up to date [  ]; Pneumococcal up to date [  ];  Other:     PHYSICAL EXAMINATION: There were no vitals taken for this visit. {physical exam:21449}  Diagnostic Studies & Laboratory data:     Recent Radiology Findings:   Ct Chest Wo Contrast  Result Date: 02/14/2019 CLINICAL DATA:  74 year old female with history of colon cancer presenting with lung nodules. EXAM: CT CHEST WITHOUT CONTRAST TECHNIQUE: Multidetector CT imaging of the chest was performed following the standard protocol without IV contrast. COMPARISON:  Chest CT 08/19/2018. FINDINGS: Cardiovascular: Heart size is normal. There is no significant pericardial fluid, thickening  or pericardial calcification. There is aortic atherosclerosis, as well as atherosclerosis of the great vessels of the mediastinum and the coronary arteries, including calcified atherosclerotic plaque in the left main, left anterior descending and left circumflex coronary arteries. Mediastinum/Nodes: No pathologically enlarged mediastinal or hilar lymph nodes. Please note that accurate exclusion of hilar adenopathy is limited on noncontrast CT scans. Esophagus is unremarkable in appearance. No axillary lymphadenopathy. Lungs/Pleura: The previously noted bilobed nodule in the left upper lobe continues increased in size  slightly, currently measuring 14 x 11 x 8 mm (coronal image 48 of series 5 and axial image 88 of series 7), previously 10 x 5 x 7 mm on 08/19/2018. 9 x 5 mm right upper lobe pulmonary nodule (axial image 31 of series 7), stable. Linear scarring in the left lower lobe is unchanged. No acute consolidative airspace disease. No pleural effusions. Diffuse bronchial wall thickening with moderate centrilobular and paraseptal emphysema. Upper Abdomen: Aortic atherosclerosis. Musculoskeletal: There are no aggressive appearing lytic or blastic lesions noted in the visualized portions of the skeleton. IMPRESSION: 1. Continued interval growth of irregular-shaped bilobed left upper lobe pulmonary nodule, as discussed above. Further evaluation with PET-CT is recommended to exclude malignancy. 2. Diffuse bronchial wall thickening with moderate centrilobular and paraseptal emphysema; imaging findings suggestive of underlying COPD. 3. Aortic atherosclerosis, in addition to left main and 2 vessel coronary artery disease. Assessment for potential risk factor modification, dietary therapy or pharmacologic therapy may be warranted, if clinically indicated. Aortic Atherosclerosis (ICD10-I70.0) and Emphysema (ICD10-J43.9). Electronically Signed   By: Vinnie Langton M.D.   On: 02/14/2019 14:23     I have independently reviewed the above radiology studies  and reviewed the findings with the patient.   Recent Lab Findings: Lab Results  Component Value Date   WBC 4.1 02/28/2013   HGB 10.8 (L) 02/28/2013   HCT 30.2 (L) 02/28/2013   PLT 201 02/28/2013   GLUCOSE 91 02/28/2013   ALT 5 02/27/2013   AST 13 02/27/2013   NA 136 02/28/2013   K 2.9 (L) 02/28/2013   CL 101 02/28/2013   CREATININE 0.60 02/28/2013   BUN 5 (L) 02/28/2013   CO2 22 02/28/2013   TSH 2.574 10/04/2013   INR 1.02 09/20/2010   HGBA1C 6.2 (H) 02/28/2013      Assessment / Plan:        I  spent {CHL ONC TIME VISIT - TGPQD:[id number]} with  the  patient face to face and greater then 50% of the time was spent in counseling and coordination of care.    [name] MD      [address] [place] [address] Office [phone]     02/20/2019 4:16 PM

## 2019-02-21 ENCOUNTER — Encounter: Payer: Medicare Other | Admitting: Cardiothoracic Surgery

## 2019-02-22 ENCOUNTER — Other Ambulatory Visit: Payer: Self-pay | Admitting: Cardiothoracic Surgery

## 2019-02-22 ENCOUNTER — Institutional Professional Consult (permissible substitution) (INDEPENDENT_AMBULATORY_CARE_PROVIDER_SITE_OTHER): Payer: Medicare Other | Admitting: Cardiothoracic Surgery

## 2019-02-22 ENCOUNTER — Other Ambulatory Visit: Payer: Self-pay | Admitting: *Deleted

## 2019-02-22 ENCOUNTER — Other Ambulatory Visit: Payer: Self-pay

## 2019-02-22 VITALS — BP 160/81 | HR 80 | Resp 20 | Ht 66.0 in | Wt 125.0 lb

## 2019-02-22 DIAGNOSIS — R911 Solitary pulmonary nodule: Secondary | ICD-10-CM

## 2019-02-22 DIAGNOSIS — R918 Other nonspecific abnormal finding of lung field: Secondary | ICD-10-CM

## 2019-02-22 DIAGNOSIS — Z85819 Personal history of malignant neoplasm of unspecified site of lip, oral cavity, and pharynx: Secondary | ICD-10-CM | POA: Diagnosis not present

## 2019-02-22 NOTE — Patient Instructions (Signed)
Pulmonary Nodule A pulmonary nodule is a small, round growth of tissue in the lung. It is sometimes referred to as a "shadow" or "spot on the lung." Nodules range in size from less than 1/5 of an inch (4 mm) to a little bigger than an inch (30 mm). Pulmonary nodules can be either noncancerous (benign) or cancerous (malignant). Most are noncancerous. Smaller nodules in people who do not smoke and do not have any other risk factors for lung cancer are more likely to be noncancerous. Larger, irregular nodules in people who smoke or who have a strong family history of lung cancer are more likely to be cancerous. What are the causes? This condition may be caused by:  A bacterial, fungal, or viral infection, such as tuberculosis. The infection is usually an old and inactive one.  A noncancerous mass of tissue.  Inflammation from conditions such as rheumatoid arthritis.  Abnormal blood vessels in the lungs.  Cancerous tissue, such as lung cancer or a cancer in another part of the body that has spread to the lung. What are the signs or symptoms? This condition usually does not cause symptoms. If symptoms appear, they are usually related to the underlying cause. For example, if the condition is caused by an infection, you may have a cough or fever. How is this diagnosed? This condition is usually diagnosed with an X-ray or CT scan. To help determine whether a pulmonary nodule is benign or malignant, your health care provider will:  Take your medical history.  Perform a physical exam.  Order tests, including: ? Blood tests. ? A skin test called a tuberculin test. This test is done to check if you have been exposed to the germ that causes tuberculosis. ? Chest X-rays. ? A CT scan. This test shows smaller pulmonary nodules more clearly and with more detail than an X-ray. ? A positron emission tomography (PET) scan. This test is done to check if the nodule is cancerous. During the test, a safe amount  of a radioactive substance is injected into the bloodstream. Then a picture is taken. ? Biopsy. In this test, a tiny piece of the pulmonary nodule is removed and then examined under a microscope. How is this treated? Treatment for this condition depends on whether the pulmonary nodule is malignant or benign as well as your risk of getting cancer.  Noncancerous nodules usually do not need to be treated, but they may need to be monitored with CT scans. If a CT scan shows that the pulmonary nodule got bigger, more tests may be done.  Some nodules need to be removed. If this is the case, you may have a procedure called a thoractomy. During the procedure, your health care provider will make an incision in your chest and remove the part of the lung where the nodule is located. Follow these instructions at home:   Take over-the-counter and prescription medicines only as told by your health care provider.  Do not use any products that contain nicotine or tobacco, such as cigarettes and e-cigarettes. If you need help quitting, ask your health care provider.  Keep all follow-up visits as told by your health care provider. This is important. Contact a health care provider if:  You have trouble breathing when you are active.  You feel sick or unusually tired.  You do not feel like eating.  You lose weight without trying.  You develop chills or night sweats. Get help right away if:  You cannot catch your breath.  You begin wheezing.  You cannot stop coughing.  You cough up blood.  You become dizzy or feel like you are going to faint.  You have sudden chest pain.  You have a fever or persistent symptoms for more than 2-3 days.  You have a fever and your symptoms suddenly get worse. Summary  A pulmonary nodule is a small, round growth of tissue in the lung. Most pulmonary nodules are noncancerous.  This condition is usually diagnosed with an X-ray or CT scan.  Common causes of  pulmonary nodules include infection, inflammation, and noncancerous growths.  Though less common, if a nodule is found to be cancerous, you will need specific diagnostic tests and treatment options as directed by your medical provider.  Treatment for this condition depends on whether the pulmonary nodule is benign or malignant as well as your risk of getting cancer. This information is not intended to replace advice given to you by your health care provider. Make sure you discuss any questions you have with your health care provider. Document Released: 02/08/2009 Document Revised: 05/07/2017 Document Reviewed: 05/12/2016 Elsevier Patient Education  2020 [employer].

## 2019-02-24 NOTE — Progress Notes (Signed)
[address]       [place] [address]             [phone]                    Courtney Bowen Medical Record #[id number] Date of Birth: January 27, 1946  Referring: Ladell Pier, MD Primary Care: Lajean Manes, MD Primary Cardiologist: No primary care provider on file.  Chief Complaint:    Chief Complaint  Patient presents with  . Lung Lesion    Surgical eval, Chest CT 02/14/19, PET Scan 08/31/18    History of Present Illness:    Courtney Bowen 73 y.o. female is seen in the office  today for lung nodules.  Patient in the past was a long-term smoker for more than 30 years but notes she stopped 7 years ago.  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  She has a history of lung nodules -CT scan in February 2015 noted an 8 x 11 mm spiculated right upper lobe lung nodule.  The most recent CT scan of the chest 02/14/2019-showed continued interval growth of irregular shaped bilobed left upper lobe pulmonary nodule the right upper lobe lung nodule has remained stable most recently measured at 9 x 5 mm.      She has a history of diabetes and hypertension  Cancer Staging Cancer of ascending colon [facility]) Staging form: Colon and Rectum, AJCC 8th Edition - Clinical: Stage IIA (cT3, cN0, cM0) - Signed by Ladell Pier, MD on 07/27/2017  Current Activity/ Functional Status:  Patient is independent with mobility/ambulation, transfers, ADL's, IADL's.   Zubrod Score: At the time of surgery this patient's most appropriate activity status/level should be described as: []     0    Normal activity, no symptoms [x]     1    Restricted in physical strenuous activity but ambulatory, able to do out light work  []     2    Ambulatory and capable of self care, unable to do work activities, up and about               >50 % of waking hours                              []     3    Only limited self care, in bed greater than 50% of waking hours []     4    Completely disabled, no self care, confined to bed or chair []     5    Moribund   Past Medical History:  Diagnosis Date  . Cancer ([place]) 07/08/12   right tonsil  . Colon cancer ([place]) dx'd 2016  . Diabetes mellitus without complication ([place])   . GERD (gastroesophageal reflux disease)   . Gout   . Hypercholesterolemia   . Hypertension    not on any meds  . Renal insufficiency    history of  . 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 Status post radiation therapy  08/17/11-10/07/12   squamous cell of tonsil  . Submandibular sialolithiasis    right  Past Surgical History:  Procedure Laterality Date  . BREAST BIOPSY     hx of  . CHOLECYSTECTOMY  20 yrs ago   laproscopic    Family History  Problem Relation Age of Onset  . Stroke Mother   . Stroke Father   . Diabetes Brother      Social History   Tobacco Use  Smoking Status Former Smoker  . Packs/day: 1.50  . Years: 45.00  . Pack years: 67.50  . Types: Cigarettes  . Quit date: 07/07/2012  . Years since quitting: 6.6  Tobacco Comment   1-1.5 ppd    Social History   Substance and Sexual Activity  Alcohol Use No   Comment: socially yrs ago     Allergies  Allergen Reactions  . Amoxicillin Nausea And Vomiting    It makes her very sick  . Erythromycin Nausea And Vomiting    Current Outpatient Medications  Medication Sig Dispense Refill  . alendronate (FOSAMAX) 70 MG tablet Take 70 mg by mouth once a week.    Marland Kitchen aspirin EC 81 MG tablet Take by mouth.    Marland Kitchen atorvastatin (LIPITOR) 10 MG tablet Take 20 mg by mouth daily.     . cholecalciferol (VITAMIN D) 400 UNITS TABS Take 400 Units by mouth daily.     Marland Kitchen losartan (COZAAR) 50 MG tablet Take 50 mg by mouth  every evening    . nicotine polacrilex (NICORETTE) 4 MG gum Take by mouth.    Marland Kitchen omeprazole (PRILOSEC) 10 MG capsule Take 10 mg by mouth daily.    . polyethylene glycol (MIRALAX / GLYCOLAX) packet Take 17 g by mouth daily as needed.      No current facility-administered medications for this visit.       Review of Systems:     Cardiac Review of Systems: [Y] = yes  or   [ N ] = no   Chest Pain [ n   ]  Resting SOB [ n  ] Exertional SOB  [ y ]  Vertell Limber Florencio.Farrier  ]   Pedal Edema [ n  ]    Palpitations [n  ] Syncope  [ n ]   Presyncope [  n ]   General Review of Systems: [Y] = yes [  ]=no Constitional: recent weight change [  ];  Wt loss over the last 3 months [   ] anorexia [  ]; fatigue [  ]; nausea [  ]; night sweats [  ]; fever [  ]; or chills [  ];           Eye : blurred vision [  ]; diplopia [   ]; vision changes [  ];  Amaurosis fugax[  ]; Resp: cough [  ];  wheezing[  ];  hemoptysis[  ]; shortness of breath[  ]; paroxysmal nocturnal dyspnea[  ]; dyspnea on exertion[  ]; or orthopnea[  ];  GI:  gallstones[  ], vomiting[  ];  dysphagia[  ]; melena[  ];  hematochezia [  ]; heartburn[  ];   Hx of  Colonoscopy[  ]; GU: kidney stones [  ]; hematuria[  ];   dysuria [  ];  nocturia[  ];  history of     obstruction [  ]; urinary frequency [  ]             Skin: rash, swelling[  ];, hair loss[  ];  peripheral edema[  ];  or itching[  ];  Musculosketetal: myalgias[  ];  joint swelling[  ];  joint erythema[  ];  joint pain[  ];  back pain[  ];  Heme/Lymph: bruising[  ];  bleeding[  ];  anemia[  ];  Neuro: TIA[  ];  headaches[  ];  stroke[  ];  vertigo[  ];  seizures[  ];   paresthesias[  ];  difficulty walking[  ];  Psych:depression[  ]; anxiety[  ];  Endocrine: diabetes[  ];  thyroid dysfunction[  ];  Immunizations: Flu up to date [  ]; Pneumococcal up to date [  ];  Other:     PHYSICAL EXAMINATION: BP (!) 160/81   Pulse 80   Resp 20   Ht 5\' 6"  (1.676 m)   Wt 125 lb (56.7 kg)   SpO2 98%  Comment: RA  BMI 20.18 kg/m  General appearance: alert, cooperative, appears stated age and no distress Head: Normocephalic, without obvious abnormality, atraumatic Neck: no adenopathy, no carotid bruit, no JVD, supple, symmetrical, trachea midline, thyroid not enlarged, symmetric, no tenderness/mass/nodules and The right side of the neck is somewhat firm likely related to the previous neck radiation Lymph nodes: Cervical, supraclavicular, and axillary nodes normal. Resp: clear to auscultation bilaterally Back: symmetric, no curvature. ROM normal. No CVA tenderness. Cardio: regular rate and rhythm, S1, S2 normal, no murmur, click, rub or gallop GI: soft, non-tender; bowel sounds normal; no masses,  no organomegaly Extremities: extremities normal, atraumatic, no cyanosis or edema and Homans sign is negative, no sign of DVT Neurologic: Grossly normal  Diagnostic Studies & Laboratory data:     Recent Radiology Findings:   Ct Chest Wo Contrast  Result Date: 02/14/2019 CLINICAL DATA:  73 year old female with history of colon cancer presenting with lung nodules. EXAM: CT CHEST WITHOUT CONTRAST TECHNIQUE: Multidetector CT imaging of the chest was performed following the standard protocol without IV contrast. COMPARISON:  Chest CT 08/19/2018. FINDINGS: Cardiovascular: Heart size is normal. There is no significant pericardial fluid, thickening or pericardial calcification. There is aortic atherosclerosis, as well as atherosclerosis of the great vessels of the mediastinum and the coronary arteries, including calcified atherosclerotic plaque in the left main, left anterior descending and left circumflex coronary arteries. Mediastinum/Nodes: No pathologically enlarged mediastinal or hilar lymph nodes. Please note that accurate exclusion of hilar adenopathy is limited on noncontrast CT scans. Esophagus is unremarkable in appearance. No axillary lymphadenopathy. Lungs/Pleura: The previously noted bilobed  nodule in the left upper lobe continues increased in size slightly, currently measuring 14 x 11 x 8 mm (coronal image 48 of series 5 and axial image 88 of series 7), previously 10 x 5 x 7 mm on 08/19/2018. 9 x 5 mm right upper lobe pulmonary nodule (axial image 31 of series 7), stable. Linear scarring in the left lower lobe is unchanged. No acute consolidative airspace disease. No pleural effusions. Diffuse bronchial wall thickening with moderate centrilobular and paraseptal emphysema. Upper Abdomen: Aortic atherosclerosis. Musculoskeletal: There are no aggressive appearing lytic or blastic lesions noted in the visualized portions of the skeleton. IMPRESSION: 1. Continued interval growth of irregular-shaped bilobed left upper lobe pulmonary nodule, as discussed above. Further evaluation with PET-CT is recommended to exclude malignancy. 2. Diffuse bronchial wall thickening with moderate centrilobular and paraseptal emphysema; imaging findings suggestive of underlying COPD. 3. Aortic atherosclerosis, in addition to left main and 2 vessel coronary artery disease. Assessment for potential risk factor modification, dietary therapy or pharmacologic therapy may be warranted, if clinically indicated. Aortic Atherosclerosis (ICD10-I70.0) and Emphysema (ICD10-J43.9).  Electronically Signed   By: Vinnie Langton M.D.   On: 02/14/2019 14:23     I have independently reviewed the above radiology studies  and reviewed the findings with the patient.   Recent Lab Findings: Lab Results  Component Value Date   WBC 4.1 02/28/2013   HGB 10.8 (L) 02/28/2013   HCT 30.2 (L) 02/28/2013   PLT 201 02/28/2013   GLUCOSE 91 02/28/2013   ALT 5 02/27/2013   AST 13 02/27/2013   NA 136 02/28/2013   K 2.9 (L) 02/28/2013   CL 101 02/28/2013   CREATININE 0.60 02/28/2013   BUN 5 (L) 02/28/2013   CO2 22 02/28/2013   TSH 2.574 10/04/2013   INR 1.02 09/20/2010   HGBA1C 6.2 (H) 02/28/2013      Assessment / Plan:   Patient with  underlying centrilobular emphysema, long-term smoking history, with bilateral pulmonary nodules, but with a left upper lobe nodule which is slowly increasing in size and become more dense.  The patient has history of both tonsillar cancer and adenocarcinoma of the colon-I have reviewed the films with her and her most concerned about the left upper lobe lung nodule, will obtain pulmonary function studies and repeat current PET scan.  Following this information I will see her back and make a decision about primary resection versus navigation bronchoscopy and attempted biopsy- during our discussion about the possible diagnosis,  already the patient is very reluctant to consider  surgical resection.     [name] MD      [address] [place] [address] Office [phone]     02/24/2019 11:20 AM

## 2019-03-03 ENCOUNTER — Ambulatory Visit (HOSPITAL_COMMUNITY)
Admission: RE | Admit: 2019-03-03 | Discharge: 2019-03-03 | Disposition: A | Discharge status: Home / Self Care | Payer: Medicare Other | Source: Ambulatory Visit | Attending: Cardiothoracic Surgery | Admitting: Cardiothoracic Surgery

## 2019-03-03 ENCOUNTER — Other Ambulatory Visit: Payer: Self-pay

## 2019-03-03 ENCOUNTER — Other Ambulatory Visit (HOSPITAL_COMMUNITY)
Admission: RE | Admit: 2019-03-03 | Discharge: 2019-03-03 | Disposition: A | Discharge status: Home / Self Care | Payer: Medicare Other | Source: Ambulatory Visit | Attending: Cardiothoracic Surgery | Admitting: Cardiothoracic Surgery

## 2019-03-03 ENCOUNTER — Other Ambulatory Visit (HOSPITAL_COMMUNITY): Payer: Medicare Other

## 2019-03-03 DIAGNOSIS — R918 Other nonspecific abnormal finding of lung field: Secondary | ICD-10-CM | POA: Diagnosis not present

## 2019-03-03 DIAGNOSIS — I2510 Atherosclerotic heart disease of native coronary artery without angina pectoris: Secondary | ICD-10-CM | POA: Diagnosis not present

## 2019-03-03 DIAGNOSIS — Z20828 Contact with and (suspected) exposure to other viral communicable diseases: Secondary | ICD-10-CM | POA: Diagnosis not present

## 2019-03-03 DIAGNOSIS — Z01812 Encounter for preprocedural laboratory examination: Secondary | ICD-10-CM | POA: Insufficient documentation

## 2019-03-03 DIAGNOSIS — R911 Solitary pulmonary nodule: Secondary | ICD-10-CM | POA: Diagnosis not present

## 2019-03-03 DIAGNOSIS — K861 Other chronic pancreatitis: Secondary | ICD-10-CM | POA: Insufficient documentation

## 2019-03-03 DIAGNOSIS — I700 Atherosclerosis of aorta: Secondary | ICD-10-CM | POA: Diagnosis not present

## 2019-03-03 DIAGNOSIS — J439 Emphysema, unspecified: Secondary | ICD-10-CM | POA: Insufficient documentation

## 2019-03-03 LAB — SARS CORONAVIRUS 2 (TAT 6-24 HRS): SARS Coronavirus 2: NEGATIVE

## 2019-03-03 LAB — GLUCOSE, CAPILLARY: Glucose-Capillary: 121 mg/dL — ABNORMAL HIGH (ref 70–99)

## 2019-03-03 MED ORDER — FLUDEOXYGLUCOSE F - 18 (FDG) INJECTION
6.2400 | Freq: Once | INTRAVENOUS | Status: AC
  Administered 2019-03-03: 6.24 via INTRAVENOUS

## 2019-03-06 ENCOUNTER — Other Ambulatory Visit: Payer: Self-pay

## 2019-03-06 ENCOUNTER — Ambulatory Visit (HOSPITAL_COMMUNITY)
Admission: RE | Admit: 2019-03-06 | Discharge: 2019-03-06 | Disposition: A | Discharge status: Home / Self Care | Payer: Medicare Other | Source: Ambulatory Visit | Attending: Cardiothoracic Surgery | Admitting: Cardiothoracic Surgery

## 2019-03-06 DIAGNOSIS — R918 Other nonspecific abnormal finding of lung field: Secondary | ICD-10-CM | POA: Diagnosis not present

## 2019-03-06 LAB — PULMONARY FUNCTION TEST
DL/VA % pred: 69 %
DL/VA: 2.82 ml/min/mmHg/L
DLCO unc % pred: 58 %
DLCO unc: 12.04 ml/min/mmHg
FEF 25-75 Post: 0.97 L/sec
FEF 25-75 Pre: 0.97 L/sec
FEF2575-%Change-Post: 0 %
FEF2575-%Pred-Post: 55 %
FEF2575-%Pred-Pre: 55 %
FEV1-%Change-Post: 3 %
FEV1-%Pred-Post: 95 %
FEV1-%Pred-Pre: 92 %
FEV1-Post: 1.88 L
FEV1-Pre: 1.82 L
FEV1FVC-%Change-Post: 0 %
FEV1FVC-%Pred-Pre: 82 %
FEV6-%Change-Post: 3 %
FEV6-%Pred-Post: 111 %
FEV6-%Pred-Pre: 107 %
FEV6-Post: 2.71 L
FEV6-Pre: 2.62 L
FEV6FVC-%Change-Post: 0 %
FEV6FVC-%Pred-Post: 100 %
FEV6FVC-%Pred-Pre: 100 %
FVC-%Change-Post: 2 %
FVC-%Pred-Post: 115 %
FVC-%Pred-Pre: 113 %
FVC-Post: 2.93 L
FVC-Pre: 2.87 L
Post FEV1/FVC ratio: 64 %
Post FEV6/FVC ratio: 97 %
Pre FEV1/FVC ratio: 64 %
Pre FEV6/FVC Ratio: 97 %
RV % pred: 85 %
RV: 1.99 L
TLC % pred: 92 %
TLC: 4.93 L

## 2019-03-06 MED ORDER — ALBUTEROL SULFATE (2.5 MG/3ML) 0.083% IN NEBU
2.5000 mg | INHALATION_SOLUTION | Freq: Once | RESPIRATORY_TRACT | Status: AC
  Administered 2019-03-06: 2.5 mg via RESPIRATORY_TRACT

## 2019-03-07 ENCOUNTER — Other Ambulatory Visit: Payer: Self-pay | Admitting: Geriatric Medicine

## 2019-03-07 DIAGNOSIS — R911 Solitary pulmonary nodule: Secondary | ICD-10-CM

## 2019-03-09 ENCOUNTER — Ambulatory Visit (INDEPENDENT_AMBULATORY_CARE_PROVIDER_SITE_OTHER): Payer: Medicare Other | Admitting: Cardiothoracic Surgery

## 2019-03-09 ENCOUNTER — Other Ambulatory Visit: Payer: Self-pay

## 2019-03-09 VITALS — BP 169/85 | HR 74 | Temp 97.6°F | Resp 20 | Ht 66.0 in | Wt 125.0 lb

## 2019-03-09 DIAGNOSIS — R918 Other nonspecific abnormal finding of lung field: Secondary | ICD-10-CM

## 2019-03-09 DIAGNOSIS — Z85819 Personal history of malignant neoplasm of unspecified site of lip, oral cavity, and pharynx: Secondary | ICD-10-CM | POA: Diagnosis not present

## 2019-03-09 NOTE — Progress Notes (Signed)
[address]       [place] [address]             [phone]                    Chessie [address] [place] Medical Record #[id number] Date of Birth: 1945/08/24  Referring: Ladell Pier, MD Primary Care: Lajean Manes, MD Primary Cardiologist: No primary care provider on file.  Chief Complaint:    Chief Complaint  Patient presents with   Lung Lesion    further discuss surgery, PET Scan 03/03/19, PFT's 03/06/19    History of Present Illness:    Courtney Bowen 73 y.o. female is seen in the office  today for  Multiple lung nodules. She was seen last week and now returns with PFT's and PET scan.   Patient in the past was a long-term smoker for more than 30 years but notes she stopped 7 years ago.  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  She has a history of lung nodules -CT scan in February 2015 noted an 8 x 11 mm spiculated right upper lobe lung nodule.  The most recent CT scan of the chest 02/14/2019-showed continued interval growth of irregular shaped bilobed left upper lobe pulmonary nodule the right upper lobe lung nodule has remained stable most recently measured at 9 x 5 mm.    She has a history of diabetes and hypertension  Cancer Staging Cancer of ascending colon [facility]) Staging form: Colon and Rectum, AJCC 8th Edition - Clinical: Stage IIA (cT3, cN0, cM0) - Signed by Ladell Pier, MD on 07/27/2017  Current Activity/ Functional Status:  Patient is independent with mobility/ambulation, transfers, ADL's, IADL's.   Zubrod Score: At the time of surgery this patients most appropriate activity status/level should be described as: []     0    Normal activity, no symptoms [x]     1     Restricted in physical strenuous activity but ambulatory, able to do out light work []     2    Ambulatory and capable of self care, unable to do work activities, up and about               >50 % of waking hours                              []     3    Only limited self care, in bed greater than 50% of waking hours []     4    Completely disabled, no self care, confined to bed or chair []     5    Moribund   Past Medical History:  Diagnosis Date   Cancer ([place]) 07/08/12   right tonsil   Colon cancer ([place]) dx'd 2016   Diabetes mellitus without complication (HCC)    GERD (gastroesophageal reflux disease)    Gout    Hypercholesterolemia    Hypertension    not on any meds   Renal insufficiency    history of   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   right   Status post radiation therapy  08/17/11-10/07/12   squamous  cell of tonsil   Submandibular sialolithiasis    right    Past Surgical History:  Procedure Laterality Date   BREAST BIOPSY     hx of   CHOLECYSTECTOMY  20 yrs ago   laproscopic    Family History  Problem Relation Age of Onset   Stroke Mother    Stroke Father    Diabetes Brother      Social History   Tobacco Use  Smoking Status Former Smoker   Packs/day: 1.50   Years: 45.00   Pack years: 67.50   Types: Cigarettes   Quit date: 07/07/2012   Years since quitting: 6.6  Tobacco Comment   1-1.5 ppd    Social History   Substance and Sexual Activity  Alcohol Use No   Comment: socially yrs ago     Allergies  Allergen Reactions   Amoxicillin Nausea And Vomiting    It makes her very sick   Erythromycin Nausea And Vomiting    Current Outpatient Medications  Medication Sig Dispense Refill   alendronate (FOSAMAX) 70 MG tablet Take 70 mg by mouth once a week.     aspirin EC 81 MG tablet Take by mouth.     atorvastatin (LIPITOR) 10 MG tablet Take 20 mg by mouth daily.      cholecalciferol (VITAMIN D) 400 UNITS TABS Take  400 Units by mouth daily.      losartan (COZAAR) 50 MG tablet Take 50 mg by mouth every evening     nicotine polacrilex (NICORETTE) 4 MG gum Take by mouth.     omeprazole (PRILOSEC) 10 MG capsule Take 10 mg by mouth daily.     polyethylene glycol (MIRALAX / GLYCOLAX) packet Take 17 g by mouth daily as needed.      No current facility-administered medications for this visit.       Review of Systems:     Cardiac Review of Systems: [Y] = yes  or   [ N ] = no   Chest Pain [n   ]  Resting SOB [ n] Exertional SOB  [ y ]  Vertell Limber Florencio.Farrier ]   Pedal Edema [ n  ]    Palpitations [n ] Syncope  [ n ]   Presyncope [  n]   General Review of Systems: [Y] = yes [  ]=no Constitional: recent weight change [  ];  Wt loss over the last 3 months [   ] anorexia [  ]; fatigue [  ]; nausea [  ]; night sweats [  ]; fever [  ]; or chills [  ];           Eye : blurred vision [  ]; diplopia [   ]; vision changes [  ];  Amaurosis fugax[  ]; Resp: cough [  ];  wheezing[  ];  hemoptysis[  ]; shortness of breath[  ]; paroxysmal nocturnal dyspnea[  ]; dyspnea on exertion[  ]; or orthopnea[  ];  GI:  gallstones[  ], vomiting[  ];  dysphagia[  ]; melena[  ];  hematochezia [  ]; heartburn[  ];   Hx of  Colonoscopy[  ]; GU: kidney stones [  ]; hematuria[  ];   dysuria [  ];  nocturia[  ];  history of     obstruction [  ]; urinary frequency [  ]             Skin: rash, swelling[  ];, hair loss[  ];  peripheral  edema[  ];  or itching[  ]; Musculosketetal: myalgias[  ];  joint swelling[  ];  joint erythema[  ];  joint pain[  ];  back pain[  ];  Heme/Lymph: bruising[  ];  bleeding[  ];  anemia[  ];  Neuro: TIA[  ];  headaches[  ];  stroke[  ];  vertigo[  ];  seizures[  ];   paresthesias[  ];  difficulty walking[  ];  Psych:depression[  ]; anxiety[  ];  Endocrine: diabetes[  ];  thyroid dysfunction[  ];  Immunizations: Flu up to date [  ]; Pneumococcal up to date [  ];  Other:     PHYSICAL EXAMINATION: BP (!) 169/85     Pulse 74    Temp 97.6 F (36.4 C) (Skin)    Resp 20    Ht 5\' 6"  (1.676 m)    Wt 56.7 kg    SpO2 95% Comment: RA   BMI 20.18 kg/m  General appearance: alert, cooperative and no distress Head: Normocephalic, without obvious abnormality, atraumatic Neck: no adenopathy, no carotid bruit, no JVD, supple, symmetrical, trachea midline and thyroid not enlarged, symmetric, no tenderness/mass/nodules, right side of neck "firm" from previous radiation  Lymph nodes: Cervical, supraclavicular, and axillary nodes normal. Resp: clear to auscultation bilaterally Back: symmetric, no curvature. ROM normal. No CVA tenderness. Cardio: regular rate and rhythm, S1, S2 normal, no murmur, click, rub or gallop GI: soft, non-tender; bowel sounds normal; no masses,  no organomegaly Extremities: extremities normal, atraumatic, no cyanosis or edema Neurologic: Grossly normal   Diagnostic Studies & Laboratory data:     Recent Radiology Findings:  Ct Chest Wo Contrast  Result Date: 02/14/2019 CLINICAL DATA:  73 year old female with history of colon cancer presenting with lung nodules. EXAM: CT CHEST WITHOUT CONTRAST TECHNIQUE: Multidetector CT imaging of the chest was performed following the standard protocol without IV contrast. COMPARISON:  Chest CT 08/19/2018. FINDINGS: Cardiovascular: Heart size is normal. There is no significant pericardial fluid, thickening or pericardial calcification. There is aortic atherosclerosis, as well as atherosclerosis of the great vessels of the mediastinum and the coronary arteries, including calcified atherosclerotic plaque in the left main, left anterior descending and left circumflex coronary arteries. Mediastinum/Nodes: No pathologically enlarged mediastinal or hilar lymph nodes. Please note that accurate exclusion of hilar adenopathy is limited on noncontrast CT scans. Esophagus is unremarkable in appearance. No axillary lymphadenopathy. Lungs/Pleura: The previously noted bilobed nodule  in the left upper lobe continues increased in size slightly, currently measuring 14 x 11 x 8 mm (coronal image 48 of series 5 and axial image 88 of series 7), previously 10 x 5 x 7 mm on 08/19/2018. 9 x 5 mm right upper lobe pulmonary nodule (axial image 31 of series 7), stable. Linear scarring in the left lower lobe is unchanged. No acute consolidative airspace disease. No pleural effusions. Diffuse bronchial wall thickening with moderate centrilobular and paraseptal emphysema. Upper Abdomen: Aortic atherosclerosis. Musculoskeletal: There are no aggressive appearing lytic or blastic lesions noted in the visualized portions of the skeleton. IMPRESSION: 1. Continued interval growth of irregular-shaped bilobed left upper lobe pulmonary nodule, as discussed above. Further evaluation with PET-CT is recommended to exclude malignancy. 2. Diffuse bronchial wall thickening with moderate centrilobular and paraseptal emphysema; imaging findings suggestive of underlying COPD. 3. Aortic atherosclerosis, in addition to left main and 2 vessel coronary artery disease. Assessment for potential risk factor modification, dietary therapy or pharmacologic therapy may be warranted, if clinically indicated. Aortic Atherosclerosis (ICD10-I70.0) and  Emphysema (ICD10-J43.9). Electronically Signed   By: Vinnie Langton M.D.   On: 02/14/2019 14:23   Nm Pet Image Restag (ps) Skull Base To Thigh  Result Date: 03/03/2019 CLINICAL DATA:  Subsequent treatment strategy for pulmonary nodule and history of colon cancer and tonsillar cancer. EXAM: NUCLEAR MEDICINE PET SKULL BASE TO THIGH TECHNIQUE: 6.24 mCi F-18 FDG was injected intravenously. Full-ring PET imaging was performed from the skull base to thigh after the radiotracer. CT data was obtained and used for attenuation correction and anatomic localization. Fasting blood glucose: 121 mg/dl COMPARISON:  08/31/2018 FINDINGS: Mediastinal blood pool activity: SUV max 2.6 Liver activity: SUV max  3.0 NECK: No hypermetabolic lymph nodes in the neck. Incidental CT findings: No signs of abnormal intracranial findings with limited assessment. No adenopathy by size criteria in the neck. Calcified atherosclerotic changes in the carotid vasculature. Airway is unremarkable. Symmetric glottic activity as before. Stable appearance of asymmetric parotid activity without visible lesion. Max SUV of 5. Contralateral max SUV of 4.4. CHEST: Diffuse hypermetabolic paraspinal and intercostal activity with symmetric appearance unchanged from previous study. Stable areas of subpleural reticulation bilaterally. Similar appearance of pulmonary nodule in the lingula measuring 11 x 6 mm. Max SUV 1.2 The vicinity of a right apical pulmonary nodule measuring approximately 8 x 3 mm there is no added FDG uptake. Peripheral ground-glass opacity in the left upper lobe with vague increased FDG uptake, max SUV 1.1. Incidental CT findings: Signs of aortic atherosclerosis, coronary artery disease and pulmonary emphysema with biapical scarring similar to prior study. No signs of adenopathy or chest wall mass. ABDOMEN/PELVIS: No abnormal hypermetabolic activity within the liver, pancreas, adrenal glands, or spleen. No hypermetabolic lymph nodes in the abdomen or pelvis. Incidental CT findings: Signs of chronic pancreatitis and biliary ductal dilation are unchanged compared to most recent prior and little changed dating back to 2014. Liver is normal. Spleen, adrenal glands and kidneys are unremarkable. No signs of acute bowel process. Dense calcific atherosclerotic changes throughout a nonaneurysmal abdominal aorta. No signs of acute process in the abdomen or pelvis. SKELETON: No focal hypermetabolic activity to suggest skeletal metastasis. Incidental CT findings: No signs of acute or destructive bone process. Spinal degenerative changes. IMPRESSION: 1. Stable lingular nodule and left upper lobe ground-glass. Continued attention on follow-up is  suggested, chest CT in 6 months may be helpful for continued surveillance. 2. Extensive brown fat activity in the upper retroperitoneum, paraspinous region extending towards the thoracic inlet. Benign finding. 3. Signs of atherosclerosis, emphysema, coronary artery calcification and chronic calcific pancreatitis are similar to prior studies. 4. Aortic Atherosclerosis (ICD10-I70.0) and Emphysema (ICD10-J43.9). Electronically Signed   By: Zetta Bills M.D.   On: 03/03/2019 16:28        I have independently reviewed the above radiology studies  and reviewed the findings with the patient.   Recent Lab Findings: Lab Results  Component Value Date   WBC 4.1 02/28/2013   HGB 10.8 (L) 02/28/2013   HCT 30.2 (L) 02/28/2013   PLT 201 02/28/2013   GLUCOSE 91 02/28/2013   ALT 5 02/27/2013   AST 13 02/27/2013   NA 136 02/28/2013   K 2.9 (L) 02/28/2013   CL 101 02/28/2013   CREATININE 0.60 02/28/2013   BUN 5 (L) 02/28/2013   CO2 22 02/28/2013   TSH 2.574 10/04/2013   INR 1.02 09/20/2010   HGBA1C 6.2 (H) 02/28/2013   pft'S fev1 1.82 92% dlco 12.04 58%   Assessment / Plan:   Patient with underlying  centrilobular emphysema, long-term smoking history, with bilateral pulmonary nodules, but with a left upper lobe nodule which is slowly increasing in size and become more dense.  The patient has history of both tonsillar cancer and adenocarcinoma of the colon-I have reviewed the films with her and her most concerned about the left upper lobe lung nodule.  I have again reviewed with the patient her scans including the recent PET scan.  She is very reluctant to agree to any interventional or invasive procedure.  We discussed the options of navigation bronchoscopy and biopsy with possible fiducial markers as an option to surgical resection.  The PET scan shows low level activity in the area of the left upper lobe/lingula, however with the slow progression in size it would not be surprising that this area  had low activity.  After long discussion with the patient she was agreeable to a follow-up CT scan can super D in 4 months.  I been encouraged her that if we see continued slow changes that we should at least proceed with navigation bronchoscopy.  She is agreeable with this and will return in 4 months with a follow-up CT of the chest.    [name] MD      [address] [place], [address] Office [phone]     03/09/2019 5:19 PM

## 2019-03-10 DIAGNOSIS — N1831 Chronic kidney disease, stage 3a: Secondary | ICD-10-CM | POA: Diagnosis not present

## 2019-03-10 DIAGNOSIS — I129 Hypertensive chronic kidney disease with stage 1 through stage 4 chronic kidney disease, or unspecified chronic kidney disease: Secondary | ICD-10-CM | POA: Diagnosis not present

## 2019-03-10 DIAGNOSIS — E1169 Type 2 diabetes mellitus with other specified complication: Secondary | ICD-10-CM | POA: Diagnosis not present

## 2019-03-16 ENCOUNTER — Other Ambulatory Visit: Payer: Medicare Other

## 2019-03-22 DIAGNOSIS — I129 Hypertensive chronic kidney disease with stage 1 through stage 4 chronic kidney disease, or unspecified chronic kidney disease: Secondary | ICD-10-CM | POA: Diagnosis not present

## 2019-03-22 DIAGNOSIS — E1169 Type 2 diabetes mellitus with other specified complication: Secondary | ICD-10-CM | POA: Diagnosis not present

## 2019-03-22 DIAGNOSIS — N1831 Chronic kidney disease, stage 3a: Secondary | ICD-10-CM | POA: Diagnosis not present

## 2019-03-22 DIAGNOSIS — E222 Syndrome of inappropriate secretion of antidiuretic hormone: Secondary | ICD-10-CM | POA: Diagnosis not present

## 2019-03-22 DIAGNOSIS — R29898 Other symptoms and signs involving the musculoskeletal system: Secondary | ICD-10-CM | POA: Diagnosis not present

## 2019-05-11 ENCOUNTER — Other Ambulatory Visit: Payer: Self-pay | Admitting: Cardiothoracic Surgery

## 2019-05-11 DIAGNOSIS — R918 Other nonspecific abnormal finding of lung field: Secondary | ICD-10-CM

## 2019-06-10 ENCOUNTER — Ambulatory Visit: Payer: Medicare Other | Attending: Internal Medicine

## 2019-06-10 DIAGNOSIS — Z23 Encounter for immunization: Secondary | ICD-10-CM | POA: Insufficient documentation

## 2019-06-10 NOTE — Progress Notes (Signed)
   Covid-19 Vaccination Clinic  Name:  Courtney Bowen    MRN: [id number] DOB: February 09, 1946  06/10/2019  [name] was observed post Covid-19 immunization for 15 minutes without incidence. She was provided with Vaccine Information Sheet and instruction to access the V-Safe system.   [name] was instructed to call 911 with any severe reactions post vaccine: Marland Kitchen Difficulty breathing  . Swelling of your face and throat  . A fast heartbeat  . A bad rash all over your body  . Dizziness and weakness    Immunizations Administered    Name Date Dose VIS Date Route   Pfizer COVID-19 Vaccine 06/10/2019  1:07 PM 0.3 mL 04/07/2019 Intramuscular   Manufacturer: [place]   Lot: [id number]   [place]: [id number]

## 2019-07-03 ENCOUNTER — Ambulatory Visit: Payer: Medicare Other | Attending: Internal Medicine

## 2019-07-03 DIAGNOSIS — Z23 Encounter for immunization: Secondary | ICD-10-CM

## 2019-07-03 NOTE — Progress Notes (Signed)
   Covid-19 Vaccination Clinic  Name:  Courtney Bowen    MRN: [id number] DOB: January 15, 1946  07/03/2019  Courtney Bowen was observed post Covid-19 immunization for 15 minutes without incident. She was provided with Vaccine Information Sheet and instruction to access the V-Safe system.   Courtney Bowen was instructed to call 911 with any severe reactions post vaccine: Marland Kitchen Difficulty breathing  . Swelling of face and throat  . A fast heartbeat  . A bad rash all over body  . Dizziness and weakness   Immunizations Administered    Name Date Dose VIS Date Route   Pfizer COVID-19 Vaccine 07/03/2019 10:57 AM 0.3 mL 04/07/2019 Intramuscular   Manufacturer: [place]   Lot: [id number]   [place]: [id number]

## 2019-07-13 ENCOUNTER — Other Ambulatory Visit: Payer: Self-pay

## 2019-07-13 ENCOUNTER — Encounter: Payer: Self-pay | Admitting: Cardiothoracic Surgery

## 2019-07-13 ENCOUNTER — Ambulatory Visit (INDEPENDENT_AMBULATORY_CARE_PROVIDER_SITE_OTHER): Payer: Medicare Other | Admitting: Cardiothoracic Surgery

## 2019-07-13 ENCOUNTER — Ambulatory Visit
Admission: RE | Admit: 2019-07-13 | Discharge: 2019-07-13 | Disposition: A | Discharge status: Home / Self Care | Payer: Medicare Other | Source: Ambulatory Visit | Attending: Cardiothoracic Surgery | Admitting: Cardiothoracic Surgery

## 2019-07-13 VITALS — BP 145/85 | HR 94 | Temp 98.1°F | Resp 16 | Ht 66.0 in | Wt 121.8 lb

## 2019-07-13 DIAGNOSIS — Z85819 Personal history of malignant neoplasm of unspecified site of lip, oral cavity, and pharynx: Secondary | ICD-10-CM | POA: Diagnosis not present

## 2019-07-13 DIAGNOSIS — R911 Solitary pulmonary nodule: Secondary | ICD-10-CM

## 2019-07-13 DIAGNOSIS — D381 Neoplasm of uncertain behavior of trachea, bronchus and lung: Secondary | ICD-10-CM | POA: Diagnosis not present

## 2019-07-13 DIAGNOSIS — R918 Other nonspecific abnormal finding of lung field: Secondary | ICD-10-CM

## 2019-07-13 NOTE — Progress Notes (Addendum)
Courtney Bowen       Courtney Bowen,Courtney Bowen             [phone]                    Courtney Bowen Medical Record #[id number] Date of Birth: 18-Nov-1945  Referring: Ladell Pier, MD Primary Care: Lajean Manes, MD Primary Cardiologist: No primary care provider on file.  Chief Complaint:    Chief Complaint  Patient presents with  . Lung Lesion    LULobe...4 month f/u with Chest CT-Super D    History of Present Illness:    Courtney Bowen 74 y.o. female is seen in the office  today for  Multiple lung nodules.    Patient in the past was a long-term smoker for more than 30 years but notes she stopped 7 years ago.  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  She has a history of lung nodules -CT scan in February 2015 noted an 8 x 11 mm spiculated right upper lobe lung nodule.  The  CT scan of the chest 02/14/2019-showed continued interval growth of irregular shaped bilobed left upper lobe pulmonary nodule, the right upper lobe lung nodule has remained stable most recently measured at 9 x 5 mm.    She has a history of diabetes and hypertension  Patient had  with a follow-up super D CT of the chest.  Previously she was not in interested in any further intervention but did agree to a follow-up CT   Cancer Staging Cancer of ascending colon [facility]) Staging form: Colon and Rectum, AJCC 8th Edition - Clinical: Stage IIA (cT3, cN0, cM0) - Signed by Ladell Pier, MD on 07/27/2017  Current Activity/ Functional Status:  Patient is independent with mobility/ambulation, transfers, ADL's, IADL's.   Zubrod Score: At the time of surgery this patient's most appropriate activity status/level should be described  as: []     0    Normal activity, no symptoms [x]     1    Restricted in physical strenuous activity but ambulatory, able to do out light work []     2    Ambulatory and capable of self care, unable to do work activities, up and about               >50 % of waking hours                              []     3    Only limited self care, in bed greater than 50% of waking hours []     4    Completely disabled, no self care, confined to bed or chair []     5    Moribund   Past Medical History:  Diagnosis Date  . Cancer ([place]) 07/08/12   right tonsil  . Colon cancer ([place]) dx'd 2016  . Diabetes mellitus without complication ([place])   . GERD (gastroesophageal reflux disease)   . Gout   . Hypercholesterolemia   . Hypertension    not on any meds  . Renal insufficiency    history of  . 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 Status post radiation therapy  08/17/11-10/07/12   squamous cell of tonsil  . Submandibular sialolithiasis    right    Past Surgical History:  Procedure Laterality Date  . BREAST BIOPSY     hx of  . CHOLECYSTECTOMY  20 yrs ago   laproscopic    Family History  Problem Relation Age of Onset  . Stroke Mother   . Stroke Father   . Diabetes Brother      Social History   Tobacco Use  Smoking Status Former Smoker  . Packs/day: 1.50  . Years: 45.00  . Pack years: 67.50  . Types: Cigarettes  . Quit date: 07/07/2012  . Years since quitting: 7.0  Smokeless Tobacco Never Used  Tobacco Comment   1-1.5 ppd    Social History   Substance and Sexual Activity  Alcohol Use No   Comment: socially yrs ago     Allergies  Allergen Reactions  . Amoxicillin Nausea And Vomiting    It makes her very sick  . Erythromycin Nausea And Vomiting    Current Outpatient Medications  Medication Sig Dispense Refill  . alendronate (FOSAMAX) 70 MG tablet Take 70 mg by mouth once a week.    Marland Kitchen amLODipine (NORVASC) 2.5 MG tablet Take 2.5 mg by mouth daily.    Marland Kitchen  aspirin EC 81 MG tablet Take by mouth.    Marland Kitchen atorvastatin (LIPITOR) 10 MG tablet Take 20 mg by mouth daily.     . cholecalciferol (VITAMIN D) 400 UNITS TABS Take 400 Units by mouth daily.     Marland Kitchen losartan (COZAAR) 50 MG tablet Take 50 mg by mouth every evening    . nicotine polacrilex (NICORETTE) 4 MG gum Take by mouth.    Marland Kitchen omeprazole (PRILOSEC) 10 MG capsule Take 10 mg by mouth daily.    . polyethylene glycol (MIRALAX / GLYCOLAX) packet Take 17 g by mouth daily as needed.      No current facility-administered medications for this visit.      Review of Systems:     Cardiac Review of Systems: [Y] = yes  or   [ N ] = no   Chest Pain [n   ]  Resting SOB [ n] Exertional SOB  [ y ]  Vertell Limber Florencio.Farrier ]   Pedal Edema [ n  ]    Palpitations [n ] Syncope  [ n ]   Presyncope [  n]   General Review of Systems: [Y] = yes [  ]=no Constitional: recent weight change [  ];  Wt loss over the last 3 months [   ] anorexia [  ]; fatigue [  ]; nausea [  ]; night sweats [  ]; fever [  ]; or chills [  ];           Eye : blurred vision [  ]; diplopia [   ]; vision changes [  ];  Amaurosis fugax[  ]; Resp: cough [  ];  wheezing[  ];  hemoptysis[  ]; shortness of breath[  ]; paroxysmal nocturnal dyspnea[  ]; dyspnea on exertion[  ]; or orthopnea[  ];  GI:  gallstones[  ], vomiting[  ];  dysphagia[  ]; melena[  ];  hematochezia [  ]; heartburn[  ];   Hx of  Colonoscopy[  ]; GU: kidney stones [  ]; hematuria[  ];   dysuria [  ];  nocturia[  ];  history of     obstruction [  ];  urinary frequency [  ]             Skin: rash, swelling[  ];, hair loss[  ];  peripheral edema[  ];  or itching[  ]; Musculosketetal: myalgias[  ];  joint swelling[  ];  joint erythema[  ];  joint pain[  ];  back pain[  ];  Heme/Lymph: bruising[  ];  bleeding[  ];  anemia[  ];  Neuro: TIA[  ];  headaches[  ];  stroke[  ];  vertigo[  ];  seizures[  ];   paresthesias[  ];  difficulty walking[  ];  Psych:depression[  ]; anxiety[  ];  Endocrine:  diabetes[  ];  thyroid dysfunction[  ];  Immunizations: Flu up to date [  ]; Pneumococcal up to date [  ];  Other:     PHYSICAL EXAMINATION: BP (!) 145/85 (BP Location: Right Arm, Patient Position: Sitting, Cuff Size: Normal)   Pulse 94   Temp 98.1 F (36.7 C)   Resp 16   Ht 5\' 6"  (1.676 m)   Wt 55.2 kg   SpO2 99% Comment: RA  BMI 19.66 kg/m  General appearance: alert, cooperative and no distress Head: Normocephalic, without obvious abnormality, atraumatic Neck: no adenopathy, no carotid bruit, no JVD, supple, symmetrical, trachea midline and thyroid not enlarged, symmetric, no tenderness/mass/nodules, right neck is firmer then left side, no mass Lymph nodes: Cervical, supraclavicular, and axillary nodes normal. Resp: clear to auscultation bilaterally Cardio: regular rate and rhythm, S1, S2 normal, no murmur, click, rub or gallop GI: soft, non-tender; bowel sounds normal; no masses,  no organomegaly Extremities: extremities normal, atraumatic, no cyanosis or edema Neurologic: Grossly normal    Diagnostic Studies & Laboratory data:     Recent Radiology Findings: CT Super D Chest Wo Contrast  Result Date: 07/13/2019 CLINICAL DATA:  Follow-up pulmonary nodules. History of squamous cell cancer right tonsil with radiation therapy. EXAM: CT CHEST WITHOUT CONTRAST TECHNIQUE: Multidetector CT imaging of the chest was performed using thin slice collimation for electromagnetic bronchoscopy planning purposes, without intravenous contrast. COMPARISON:  02/14/2019 FINDINGS: Cardiovascular: Calcified coronary artery disease similar to prior study. Calcified atherosclerosis of the thoracic aorta is unchanged, no signs of aneurysm. Heart size is normal without pericardial effusion. Central pulmonary vasculature is of normal caliber. Mediastinum/Nodes: Scattered mediastinal lymph nodes, unenlarged and unchanged. No axillary adenopathy. Thoracic inlet structures are normal. Esophagus mildly  patulous. Lungs/Pleura: Signs of biapical scarring related to prior radiation therapy, stable. Nine by 5 mm right upper lobe pulmonary nodule (image 22, series 9) unchanged since the previous exam. Pleural and parenchymal scarring along the periphery of the right chest elsewhere is also similar. Left upper lobe pulmonary nodule (image 78, series 9) 12 x 8 mm, previously 11 x 8 mm. This measured approximately 11 by 5 mm on the study of August 19, 2018. Signs of emphysema and basilar scarring worse on the left as before. Upper Abdomen: Incidental imaging of upper abdominal contents shows no acute finding. Changes of chronic calcific pancreatitis likely associated with intraductal calculi are partially imaged. Musculoskeletal: No evidence of acute bone finding or destructive bone process. IMPRESSION: 1. Slight interval enlargement of left upper lobe pulmonary nodule, now measuring 12 x 8 mm, previously 11 x 5 mm. This is more solid appearing than on the study of July 16, 2017 and has enlarged in mean diameter since the study of April of 2020. Given persistence this remains concerning for neoplasm. Given low level uptake on PET indolent process  is favored. 2. Stable appearance of right upper lobe pulmonary nodule. 3. Stable changes of emphysema and basilar scarring. 4. Calcified coronary artery disease. 5. 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 Aortic Atherosclerosis (ICD10-I70.0) and Emphysema (ICD10-J43.9). Electronically Signed   By: Zetta Bills M.D.   On: 07/13/2019 13:57   Ct Chest Wo Contrast  Result Date: 02/14/2019 CLINICAL DATA:  74 year old female with history of colon cancer presenting with lung nodules. EXAM: CT CHEST WITHOUT CONTRAST TECHNIQUE: Multidetector CT imaging of the chest was performed following the standard protocol without IV contrast. COMPARISON:  Chest CT 08/19/2018. FINDINGS: Cardiovascular: Heart size is normal. There is no significant  pericardial fluid, thickening or pericardial calcification. There is aortic atherosclerosis, as well as atherosclerosis of the great vessels of the mediastinum and the coronary arteries, including calcified atherosclerotic plaque in the left main, left anterior descending and left circumflex coronary arteries. Mediastinum/Nodes: No pathologically enlarged mediastinal or hilar lymph nodes. Please note that accurate exclusion of hilar adenopathy is limited on noncontrast CT scans. Esophagus is unremarkable in appearance. No axillary lymphadenopathy. Lungs/Pleura: The previously noted bilobed nodule in the left upper lobe continues increased in size slightly, currently measuring 14 x 11 x 8 mm (coronal image 48 of series 5 and axial image 88 of series 7), previously 10 x 5 x 7 mm on 08/19/2018. 9 x 5 mm right upper lobe pulmonary nodule (axial image 31 of series 7), stable. Linear scarring in the left lower lobe is unchanged. No acute consolidative airspace disease. No pleural effusions. Diffuse bronchial wall thickening with moderate centrilobular and paraseptal emphysema. Upper Abdomen: Aortic atherosclerosis. Musculoskeletal: There are no aggressive appearing lytic or blastic lesions noted in the visualized portions of the skeleton. IMPRESSION: 1. Continued interval growth of irregular-shaped bilobed left upper lobe pulmonary nodule, as discussed above. Further evaluation with PET-CT is recommended to exclude malignancy. 2. Diffuse bronchial wall thickening with moderate centrilobular and paraseptal emphysema; imaging findings suggestive of underlying COPD. 3. Aortic atherosclerosis, in addition to left main and 2 vessel coronary artery disease. Assessment for potential risk factor modification, dietary therapy or pharmacologic therapy may be warranted, if clinically indicated. Aortic Atherosclerosis (ICD10-I70.0) and Emphysema (ICD10-J43.9). Electronically Signed   By: Vinnie Langton M.D.   On: 02/14/2019 14:23     Nm Pet Image Restag (ps) Skull Base To Thigh  Result Date: 03/03/2019 CLINICAL DATA:  Subsequent treatment strategy for pulmonary nodule and history of colon cancer and tonsillar cancer. EXAM: NUCLEAR MEDICINE PET SKULL BASE TO THIGH TECHNIQUE: 6.24 mCi F-18 FDG was injected intravenously. Full-ring PET imaging was performed from the skull base to thigh after the radiotracer. CT data was obtained and used for attenuation correction and anatomic localization. Fasting blood glucose: 121 mg/dl COMPARISON:  08/31/2018 FINDINGS: Mediastinal blood pool activity: SUV max 2.6 Liver activity: SUV max 3.0 NECK: No hypermetabolic lymph nodes in the neck. Incidental CT findings: No signs of abnormal intracranial findings with limited assessment. No adenopathy by size criteria in the neck. Calcified atherosclerotic changes in the carotid vasculature. Airway is unremarkable. Symmetric glottic activity as before. Stable appearance of asymmetric parotid activity without visible lesion. Max SUV of 5. Contralateral max SUV of 4.4. CHEST: Diffuse hypermetabolic paraspinal and intercostal activity with symmetric appearance unchanged from previous study. Stable areas of subpleural reticulation bilaterally. Similar appearance of pulmonary nodule in the lingula measuring 11 x 6 mm. Max SUV 1.2 The vicinity of a right apical pulmonary nodule measuring approximately 8 x 3 mm  there is no added FDG uptake. Peripheral ground-glass opacity in the left upper lobe with vague increased FDG uptake, max SUV 1.1. Incidental CT findings: Signs of aortic atherosclerosis, coronary artery disease and pulmonary emphysema with biapical scarring similar to prior study. No signs of adenopathy or chest wall mass. ABDOMEN/PELVIS: No abnormal hypermetabolic activity within the liver, pancreas, adrenal glands, or spleen. No hypermetabolic lymph nodes in the abdomen or pelvis. Incidental CT findings: Signs of chronic pancreatitis and biliary ductal dilation  are unchanged compared to most recent prior and little changed dating back to 2014. Liver is normal. Spleen, adrenal glands and kidneys are unremarkable. No signs of acute bowel process. Dense calcific atherosclerotic changes throughout a nonaneurysmal abdominal aorta. No signs of acute process in the abdomen or pelvis. SKELETON: No focal hypermetabolic activity to suggest skeletal metastasis. Incidental CT findings: No signs of acute or destructive bone process. Spinal degenerative changes. IMPRESSION: 1. Stable lingular nodule and left upper lobe ground-glass. Continued attention on follow-up is suggested, chest CT in 6 months may be helpful for continued surveillance. 2. Extensive brown fat activity in the upper retroperitoneum, paraspinous region extending towards the thoracic inlet. Benign finding. 3. Signs of atherosclerosis, emphysema, coronary artery calcification and chronic calcific pancreatitis are similar to prior studies. 4. Aortic Atherosclerosis (ICD10-I70.0) and Emphysema (ICD10-J43.9). Electronically Signed   By: Zetta Bills M.D.   On: 03/03/2019 16:28        I have independently reviewed the above radiology studies  and reviewed the findings with the patient.   Recent Lab Findings: Lab Results  Component Value Date   WBC 4.1 02/28/2013   HGB 10.8 (L) 02/28/2013   HCT 30.2 (L) 02/28/2013   PLT 201 02/28/2013   GLUCOSE 91 02/28/2013   ALT 5 02/27/2013   AST 13 02/27/2013   NA 136 02/28/2013   K 2.9 (L) 02/28/2013   CL 101 02/28/2013   CREATININE 0.60 02/28/2013   BUN 5 (L) 02/28/2013   CO2 22 02/28/2013   TSH 2.574 10/04/2013   INR 1.02 09/20/2010   HGBA1C 6.2 (H) 02/28/2013   pft'S fev1 1.82 92% dlco 12.04 58%   Assessment / Plan:   Patient with underlying centrilobular emphysema, long-term smoking history, with bilateral pulmonary nodules, but with a left upper lobe nodule which is slowly increasing in size and become more dense.  The patient has history of both  tonsillar cancer and adenocarcinoma of the colon- I have again reviewed with the patient her scans including the most recent CT and  Previous  PET scan.  She is very reluctant to agree to any interventional or invasive procedure.  We discussed the options of navigation bronchoscopy and biopsy with possible fiducial markers as an option to surgical resection.  The PET scan shows low level activity in the area of the left upper lobe/lingula, however with the slow progression in size it would not be surprising that this area had low activity.  With continued slow enlargement over the left upper lobe lesion and becoming more solid in appearance, again recommend to the patient that we at least try to obtain a tissue diagnosis.  She is adamant about not having any surgery for removal but is willing to proceed with navigation bronchoscopy attempted biopsy placement of fiducials and possibly stereotactic radiotherapy if warranted.  I  spent 25 minutes with  the patient face to face explaining options and reviewe of her ct's and PET  [name] MD      [phone]  [address] [place] [address] Office [phone]     07/13/2019 2:22 PM

## 2019-07-14 ENCOUNTER — Encounter (HOSPITAL_COMMUNITY): Payer: Self-pay

## 2019-07-17 ENCOUNTER — Other Ambulatory Visit (HOSPITAL_COMMUNITY)
Admission: RE | Admit: 2019-07-17 | Discharge: 2019-07-17 | Disposition: A | Discharge status: Home / Self Care | Payer: Medicare Other | Source: Ambulatory Visit | Attending: Cardiothoracic Surgery | Admitting: Cardiothoracic Surgery

## 2019-07-17 ENCOUNTER — Other Ambulatory Visit: Payer: Self-pay

## 2019-07-17 ENCOUNTER — Encounter (HOSPITAL_COMMUNITY)
Admission: RE | Admit: 2019-07-17 | Discharge: 2019-07-17 | Disposition: A | Discharge status: Home / Self Care | Payer: Medicare Other | Source: Ambulatory Visit | Attending: Cardiothoracic Surgery | Admitting: Cardiothoracic Surgery

## 2019-07-17 ENCOUNTER — Other Ambulatory Visit (HOSPITAL_COMMUNITY): Payer: Medicare Other

## 2019-07-17 ENCOUNTER — Encounter (HOSPITAL_COMMUNITY): Payer: Self-pay

## 2019-07-17 ENCOUNTER — Ambulatory Visit (HOSPITAL_COMMUNITY)
Admission: RE | Admit: 2019-07-17 | Discharge: 2019-07-17 | Disposition: A | Discharge status: Home / Self Care | Payer: Medicare Other | Source: Ambulatory Visit | Attending: Cardiothoracic Surgery | Admitting: Cardiothoracic Surgery

## 2019-07-17 DIAGNOSIS — D649 Anemia, unspecified: Secondary | ICD-10-CM | POA: Diagnosis not present

## 2019-07-17 DIAGNOSIS — Z9049 Acquired absence of other specified parts of digestive tract: Secondary | ICD-10-CM | POA: Insufficient documentation

## 2019-07-17 DIAGNOSIS — Z20822 Contact with and (suspected) exposure to covid-19: Secondary | ICD-10-CM | POA: Insufficient documentation

## 2019-07-17 DIAGNOSIS — R911 Solitary pulmonary nodule: Secondary | ICD-10-CM | POA: Insufficient documentation

## 2019-07-17 DIAGNOSIS — I10 Essential (primary) hypertension: Secondary | ICD-10-CM | POA: Diagnosis not present

## 2019-07-17 DIAGNOSIS — Z923 Personal history of irradiation: Secondary | ICD-10-CM | POA: Diagnosis not present

## 2019-07-17 DIAGNOSIS — Z85038 Personal history of other malignant neoplasm of large intestine: Secondary | ICD-10-CM | POA: Diagnosis not present

## 2019-07-17 DIAGNOSIS — E119 Type 2 diabetes mellitus without complications: Secondary | ICD-10-CM | POA: Diagnosis not present

## 2019-07-17 DIAGNOSIS — Z8589 Personal history of malignant neoplasm of other organs and systems: Secondary | ICD-10-CM | POA: Diagnosis not present

## 2019-07-17 DIAGNOSIS — Z01818 Encounter for other preprocedural examination: Secondary | ICD-10-CM | POA: Diagnosis not present

## 2019-07-17 HISTORY — DX: Pneumonia, unspecified organism: J18.9

## 2019-07-17 HISTORY — DX: Chronic obstructive pulmonary disease, unspecified: J44.9

## 2019-07-17 HISTORY — DX: Presence of spectacles and contact lenses: Z97.3

## 2019-07-17 HISTORY — DX: Solitary pulmonary nodule: R91.1

## 2019-07-17 HISTORY — DX: Unspecified cataract: H26.9

## 2019-07-17 LAB — PROTIME-INR
INR: 1 (ref 0.8–1.2)
Prothrombin Time: 13.1 seconds (ref 11.4–15.2)

## 2019-07-17 LAB — COMPREHENSIVE METABOLIC PANEL
ALT: 14 U/L (ref 0–44)
AST: 21 U/L (ref 15–41)
Albumin: 4.5 g/dL (ref 3.5–5.0)
Alkaline Phosphatase: 63 U/L (ref 38–126)
Anion gap: 11 (ref 5–15)
BUN: 7 mg/dL — ABNORMAL LOW (ref 8–23)
CO2: 23 mmol/L (ref 22–32)
Calcium: 9.6 mg/dL (ref 8.9–10.3)
Chloride: 95 mmol/L — ABNORMAL LOW (ref 98–111)
Creatinine, Ser: 0.96 mg/dL (ref 0.44–1.00)
GFR calc Af Amer: >60 mL/min (ref >60)
GFR calc non Af Amer: 59 mL/min — ABNORMAL LOW (ref >60)
Glucose, Bld: 112 mg/dL — ABNORMAL HIGH (ref 70–99)
Potassium: 4.3 mmol/L (ref 3.5–5.1)
Sodium: 129 mmol/L — ABNORMAL LOW (ref 135–145)
Total Bilirubin: 0.5 mg/dL (ref 0.3–1.2)
Total Protein: 7.9 g/dL (ref 6.5–8.1)

## 2019-07-17 LAB — CBC
HCT: 35.1 % — ABNORMAL LOW (ref 36.0–46.0)
Hemoglobin: 11.8 g/dL — ABNORMAL LOW (ref 12.0–15.0)
MCH: 25.2 pg — ABNORMAL LOW (ref 26.0–34.0)
MCHC: 33.6 g/dL (ref 30.0–36.0)
MCV: 74.8 fL — ABNORMAL LOW (ref 80.0–100.0)
Platelets: 234 10*3/uL (ref 150–400)
RBC: 4.69 MIL/uL (ref 3.87–5.11)
RDW: 14.2 % (ref 11.5–15.5)
WBC: 4.1 10*3/uL (ref 4.0–10.5)
nRBC: 0 % (ref 0.0–0.2)

## 2019-07-17 LAB — GLUCOSE, CAPILLARY: Glucose-Capillary: 118 mg/dL — ABNORMAL HIGH (ref 70–99)

## 2019-07-17 LAB — HEMOGLOBIN A1C
Hgb A1c MFr Bld: 6.8 % — ABNORMAL HIGH (ref 4.8–5.6)
Mean Plasma Glucose: 148.46 mg/dL

## 2019-07-17 LAB — APTT: aPTT: 39 seconds — ABNORMAL HIGH (ref 24–36)

## 2019-07-17 NOTE — Pre-Procedure Instructions (Signed)
ELLAJANE STONG  07/17/2019    Your procedure is scheduled on Wednesday, July 19, 2019  Report to [facility] Admitting at 10:55 A.M.  Call this number if you have problems the morning of surgery:  [phone]   Remember:  Do not eat or drink after midnight the night before surgery.     Take these medicines the morning of surgery with A SIP OF WATER :   amLODipine (NORVASC)   omeprazole (PRILOSEC)  Stop taking Aspirin (unless otherwise advised by surgeon), vitamins, fish oil and herbal medications. Do not take any NSAIDs ie: Ibuprofen, Advil, Naproxen (Aleve), Motrin, BC and Goody Powder; stop now.    How to Manage Your Diabetes Before and After Surgery  Why is it important to control my blood sugar before and after surgery? . Improving blood sugar levels before and after surgery helps healing and can limit problems. . A way of improving blood sugar control is eating a healthy diet by: o  Eating less sugar and carbohydrates o  Increasing activity/exercise o  Talking with your doctor about reaching your blood sugar goals . High blood sugars (greater than 180 mg/dL) can raise your risk of infections and slow your recovery, so you will need to focus on controlling your diabetes during the weeks before surgery. . Make sure that the doctor who takes care of your diabetes knows about your planned surgery including the date and location.  How do I manage my blood sugar before surgery? . Check your blood sugar at least 4 times a day, starting 2 days before surgery, to make sure that the level is not too high or low. o Check your blood sugar the morning of your surgery when you wake up and every 2 hours until you get to the Short Stay unit. . If your blood sugar is less than 70 mg/dL, you will need to treat for low blood sugar: o Treat a low blood sugar (less than 70 mg/dL) with  cup of clear juice (cranberry or apple), 4 glucose tablets, OR glucose gel. Recheck blood  sugar in 15 minutes after treatment (to make sure it is greater than 70 mg/dL). If your blood sugar is not greater than 70 mg/dL on recheck, call [phone] o  for further instructions. . Report your blood sugar to the short stay nurse when you get to Short Stay.  . If you are admitted to the hospital after surgery: o Your blood sugar will be checked by the staff and you will probably be given insulin after surgery (instead of oral diabetes medicines) to make sure you have good blood sugar levels. o The goal for blood sugar control after surgery is 80-180 mg/dL.  WHAT DO I DO ABOUT MY DIABETES MEDICATION? N/A  Reviewed and Endorsed by [facility] Patient Education Committee, August 2015   Do not wear jewelry, make-up or nail polish.  Do not wear lotions, powders, or perfumes, or deodorant.  Do not shave 48 hours prior to surgery.    Do not bring valuables to the hospital.  [facility] is not responsible for any belongings or valuables.  Contacts, dentures or bridgework may not be worn into surgery.  Leave your suitcase in the car.  After surgery it may be brought to your room.  For patients admitted to the hospital, discharge time will be determined by your treatment team.  Patients discharged the day of surgery will not be allowed to drive home.   Special instructions: See "  [facility] Preparing For Surgery " sheet.   Please read over the following fact sheets that you were given. Pain Booklet, Coughing and Deep Breathing and Surgical Site Infection Prevention

## 2019-07-17 NOTE — Progress Notes (Signed)
Pt denies SOB, chest pain, and being under the care of a cardiologist. Pt stated that PCP is Dr. Lajean Manes. Pt stated that a stress test was performed > 10 years ago. Pt denies having an echo and cardiac cath. Pt denies having an EKG in the last year. Pt denies having a recent chest x ray. Pt denies recent labs. Pt reminded to quarantine.  Pt verbalized understanding of all pre-op instructions.

## 2019-07-17 NOTE — Pre-Procedure Instructions (Signed)
DEANN MCLAINE  07/17/2019    Your procedure is scheduled on Wednesday, July 19, 2019  Report to [facility] Admitting at 10:55 A.M.  Call this number if you have problems the morning of surgery:  [phone]   Remember:  Do not eat or drink after midnight the night before surgery.     Take these medicines the morning of surgery with A SIP OF WATER :   amLODipine (NORVASC)   omeprazole (PRILOSEC)  Stop taking Aspirin (unless otherwise advised by surgeon), vitamins, fish oil and herbal medications. Do not take any NSAIDs ie: Ibuprofen, Advil, Naproxen (Aleve), Motrin, BC and Goody Powder; stop now.    How to Manage Your Diabetes Before and After Surgery  Why is it important to control my blood sugar before and after surgery? . Improving blood sugar levels before and after surgery helps healing and can limit problems. . A way of improving blood sugar control is eating a healthy diet by: o  Eating less sugar and carbohydrates o  Increasing activity/exercise o  Talking with your doctor about reaching your blood sugar goals . High blood sugars (greater than 180 mg/dL) can raise your risk of infections and slow your recovery, so you will need to focus on controlling your diabetes during the weeks before surgery. . Make sure that the doctor who takes care of your diabetes knows about your planned surgery including the date and location.  How do I manage my blood sugar before surgery? . Check your blood sugar at least 4 times a day, starting 2 days before surgery, to make sure that the level is not too high or low. o Check your blood sugar the morning of your surgery when you wake up and every 2 hours until you get to the Short Stay unit. . If your blood sugar is less than 70 mg/dL, you will need to treat for low blood sugar: o Treat a low blood sugar (less than 70 mg/dL) with  cup of clear juice (cranberry or apple), 4 glucose tablets, OR glucose gel. Recheck blood  sugar in 15 minutes after treatment (to make sure it is greater than 70 mg/dL). If your blood sugar is not greater than 70 mg/dL on recheck, call [phone] o  for further instructions. . Report your blood sugar to the short stay nurse when you get to Short Stay.  . If you are admitted to the hospital after surgery: o Your blood sugar will be checked by the staff and you will probably be given insulin after surgery (instead of oral diabetes medicines) to make sure you have good blood sugar levels. o The goal for blood sugar control after surgery is 80-180 mg/dL.  WHAT DO I DO ABOUT MY DIABETES MEDICATION? N/A  Reviewed and Endorsed by [facility] Patient Education Committee, August 2015   Do not wear jewelry, make-up or nail polish.  Do not wear lotions, powders, or perfumes, or deodorant.  Do not shave 48 hours prior to surgery.    Do not bring valuables to the hospital.  [facility] is not responsible for any belongings or valuables.  Contacts, dentures or bridgework may not be worn into surgery.  Leave your suitcase in the car.  After surgery it may be brought to your room.  For patients admitted to the hospital, discharge time will be determined by your treatment team.  Patients discharged the day of surgery will not be allowed to drive home.   Special instructions: See "  [facility] Preparing For Surgery " sheet.   Please read over the following fact sheets that you were given. Pain Booklet, Coughing and Deep Breathing and Surgical Site Infection Prevention

## 2019-07-17 NOTE — Progress Notes (Signed)
Pt chart forwarded to PA, Anesthesiology, for review.

## 2019-07-18 LAB — SARS CORONAVIRUS 2 (TAT 6-24 HRS): SARS Coronavirus 2: NEGATIVE

## 2019-07-18 NOTE — Anesthesia Preprocedure Evaluation (Addendum)
Anesthesia Evaluation  Patient identified by MRN, date of birth, ID band Patient awake    Reviewed: Allergy & Precautions, H&P , NPO status , Patient's Chart, lab work & pertinent test results  Airway Mallampati: II   Neck ROM: full    Dental   Pulmonary COPD, former smoker,  Lung nodule   breath sounds clear to auscultation       Cardiovascular hypertension,  Rhythm:regular Rate:Normal     Neuro/Psych    GI/Hepatic GERD  ,  Endo/Other  diabetes, Type 2  Renal/GU Renal InsufficiencyRenal disease     Musculoskeletal   Abdominal   Peds  Hematology   Anesthesia Other Findings   Reproductive/Obstetrics                            Anesthesia Physical Anesthesia Plan  ASA: III  Anesthesia Plan: General   Post-op Pain Management:    Induction: Intravenous  PONV Risk Score and Plan: 3 and Ondansetron, Dexamethasone, Midazolam and Treatment may vary due to age or medical condition  Airway Management Planned: Oral ETT  Additional Equipment:   Intra-op Plan:   Post-operative Plan: Extubation in OR  Informed Consent: I have reviewed the patients History and Physical, chart, labs and discussed the procedure including the risks, benefits and alternatives for the proposed anesthesia with the patient or authorized representative who has indicated his/her understanding and acceptance.       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  Anesthesia note from endotracheal intubation 10/30/16 at [facility]: 10/30/16; 0737 (created via procedure documentation); Placed in OR; Blade type: Macintosh; Blade size: 3; Laryngoscopic view grade: 3; Airway size (mm): 7.0; Cuffed; Insertion  attempts: 1; Cricoid pressure: Yes; Placement verification: Bilateral Breath Sounds, ETCO2; Secured at 24 cm; Measured from Lips; Complex? No; Mask airway: Easy  Proep labs reviewed, mild-mod hyponatremia Na 129 (review of labs shows history of similar), mild anemia Hgb 11.8, diet controlled DMII A1c 6.8.  EKG 07/17/19: Sinus rhythm with sinus arrhythmia with occasional Premature ventricular complexes. Rate 85. Nonspecific T wave abnormality  Chest CT 07/13/19: IMPRESSION: 1. Slight interval enlargement of left upper lobe pulmonary nodule, now measuring 12 x 8 mm, previously 11 x 5 mm. This is more solid appearing than on the study of July 16, 2017 and has enlarged in mean diameter since the study of April of 2020. Given persistence this remains concerning for neoplasm. Given low level uptake on PET indolent process is favored. 2. Stable appearance of right upper lobe pulmonary nodule. 3. Stable changes of emphysema and basilar scarring. 4. Calcified coronary artery disease. 5. 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  PFT 03/06/19: FVC-%Pred-Pre Latest Units: % 113 FEV1-%Pred-Pre Latest Units: % 92 FEV1FVC-%Pred-Pre Latest Units: % 82 TLC % pred Latest Units: % 92 DLCO unc % pred Latest Units: % 58 )       Anesthesia Quick Evaluation

## 2019-07-18 NOTE — Progress Notes (Signed)
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  Anesthesia note from endotracheal intubation 10/30/16 at [facility]: 10/30/16; 0737 (created via procedure documentation); Placed in OR; Blade type: Macintosh; Blade size: 3; Laryngoscopic view grade: 3; Airway size (mm): 7.0; Cuffed; Insertion attempts: 1; Cricoid pressure: Yes; Placement verification: Bilateral Breath Sounds, ETCO2; Secured at 24 cm; Measured from Lips; Complex? No; Mask airway: Easy  Proep labs reviewed, mild-mod hyponatremia Na 129 (review of labs shows history of similar), mild anemia Hgb 11.8, diet controlled DMII A1c 6.8.  EKG 07/17/19: Sinus rhythm with sinus arrhythmia with occasional Premature ventricular complexes. Rate 85. Nonspecific T wave abnormality  Chest CT 07/13/19: IMPRESSION: 1. Slight interval enlargement of left upper lobe pulmonary nodule, now measuring 12 x 8 mm, previously 11 x 5 mm. This is more solid appearing than on the study of July 16, 2017 and has enlarged in mean diameter since the study of April of 2020. Given persistence this remains concerning for neoplasm. Given low level uptake on PET indolent process is favored. 2. Stable appearance of right upper lobe pulmonary nodule. 3. Stable changes of emphysema and basilar scarring. 4. Calcified coronary artery disease. 5. 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  PFT 03/06/19: FVC-%Pred-Pre Latest Units: % 113  FEV1-%Pred-Pre Latest Units: % 92  FEV1FVC-%Pred-Pre Latest Units: % 82  TLC % pred Latest Units: % 92  DLCO unc % pred Latest Units: % [address]    Wynonia Musty [facility] Short Stay Center/Anesthesiology Phone [phone] 07/18/2019 8:38 AM

## 2019-07-19 ENCOUNTER — Other Ambulatory Visit: Payer: Self-pay

## 2019-07-19 ENCOUNTER — Encounter (HOSPITAL_COMMUNITY): Payer: Self-pay | Admitting: Cardiothoracic Surgery

## 2019-07-19 ENCOUNTER — Ambulatory Visit (HOSPITAL_COMMUNITY): Payer: Medicare Other

## 2019-07-19 ENCOUNTER — Encounter (HOSPITAL_COMMUNITY)
Admission: RE | Disposition: A | Discharge status: Home / Self Care | Payer: Self-pay | Source: Home / Self Care | Attending: Cardiothoracic Surgery

## 2019-07-19 ENCOUNTER — Ambulatory Visit (HOSPITAL_COMMUNITY): Payer: Medicare Other | Admitting: Physician Assistant

## 2019-07-19 ENCOUNTER — Ambulatory Visit (HOSPITAL_COMMUNITY)
Admission: RE | Admit: 2019-07-19 | Discharge: 2019-07-19 | Disposition: A | Discharge status: Home / Self Care | Payer: Medicare Other | Attending: Cardiothoracic Surgery | Admitting: Cardiothoracic Surgery

## 2019-07-19 DIAGNOSIS — I700 Atherosclerosis of aorta: Secondary | ICD-10-CM | POA: Diagnosis not present

## 2019-07-19 DIAGNOSIS — I2510 Atherosclerotic heart disease of native coronary artery without angina pectoris: Secondary | ICD-10-CM | POA: Diagnosis not present

## 2019-07-19 DIAGNOSIS — Z87891 Personal history of nicotine dependence: Secondary | ICD-10-CM | POA: Diagnosis not present

## 2019-07-19 DIAGNOSIS — Z85038 Personal history of other malignant neoplasm of large intestine: Secondary | ICD-10-CM | POA: Diagnosis not present

## 2019-07-19 DIAGNOSIS — Z09 Encounter for follow-up examination after completed treatment for conditions other than malignant neoplasm: Secondary | ICD-10-CM

## 2019-07-19 DIAGNOSIS — E119 Type 2 diabetes mellitus without complications: Secondary | ICD-10-CM | POA: Insufficient documentation

## 2019-07-19 DIAGNOSIS — I10 Essential (primary) hypertension: Secondary | ICD-10-CM | POA: Diagnosis not present

## 2019-07-19 DIAGNOSIS — Z419 Encounter for procedure for purposes other than remedying health state, unspecified: Secondary | ICD-10-CM

## 2019-07-19 DIAGNOSIS — Z923 Personal history of irradiation: Secondary | ICD-10-CM | POA: Insufficient documentation

## 2019-07-19 DIAGNOSIS — K219 Gastro-esophageal reflux disease without esophagitis: Secondary | ICD-10-CM | POA: Insufficient documentation

## 2019-07-19 DIAGNOSIS — J439 Emphysema, unspecified: Secondary | ICD-10-CM | POA: Diagnosis not present

## 2019-07-19 DIAGNOSIS — R911 Solitary pulmonary nodule: Secondary | ICD-10-CM

## 2019-07-19 DIAGNOSIS — Z85818 Personal history of malignant neoplasm of other sites of lip, oral cavity, and pharynx: Secondary | ICD-10-CM | POA: Diagnosis not present

## 2019-07-19 DIAGNOSIS — E876 Hypokalemia: Secondary | ICD-10-CM | POA: Diagnosis not present

## 2019-07-19 DIAGNOSIS — E7800 Pure hypercholesterolemia, unspecified: Secondary | ICD-10-CM | POA: Insufficient documentation

## 2019-07-19 DIAGNOSIS — R846 Abnormal cytological findings in specimens from respiratory organs and thorax: Secondary | ICD-10-CM | POA: Diagnosis not present

## 2019-07-19 HISTORY — PX: FUDUCIAL PLACEMENT: SHX5083

## 2019-07-19 HISTORY — PX: LUNG BIOPSY: SHX5088

## 2019-07-19 HISTORY — PX: VIDEO BRONCHOSCOPY WITH ENDOBRONCHIAL NAVIGATION: SHX6175

## 2019-07-19 LAB — GLUCOSE, CAPILLARY
Glucose-Capillary: 116 mg/dL — ABNORMAL HIGH (ref 70–99)
Glucose-Capillary: 117 mg/dL — ABNORMAL HIGH (ref 70–99)

## 2019-07-19 SURGERY — VIDEO BRONCHOSCOPY WITH ENDOBRONCHIAL NAVIGATION
Anesthesia: General

## 2019-07-19 MED ORDER — PROPOFOL 10 MG/ML IV BOLUS
INTRAVENOUS | Status: DC | PRN
  Administered 2019-07-19: 120 mg via INTRAVENOUS

## 2019-07-19 MED ORDER — FENTANYL CITRATE (PF) 100 MCG/2ML IJ SOLN: 25.0000 ug | INTRAMUSCULAR | Status: DC | PRN

## 2019-07-19 MED ORDER — OXYCODONE HCL 5 MG PO TABS: 5.0000 mg | ORAL_TABLET | Freq: Once | ORAL | Status: DC | PRN

## 2019-07-19 MED ORDER — ONDANSETRON HCL 4 MG/2ML IJ SOLN
INTRAMUSCULAR | Status: DC | PRN
  Administered 2019-07-19: 4 mg via INTRAVENOUS

## 2019-07-19 MED ORDER — EPINEPHRINE PF 1 MG/ML IJ SOLN
INTRAMUSCULAR | Status: AC
  Filled 2019-07-19: qty 1

## 2019-07-19 MED ORDER — LACTATED RINGERS IV SOLN: INTRAVENOUS | Status: DC

## 2019-07-19 MED ORDER — ONDANSETRON HCL 4 MG/2ML IJ SOLN: 4.0000 mg | Freq: Four times a day (QID) | INTRAMUSCULAR | Status: DC | PRN

## 2019-07-19 MED ORDER — PROPOFOL 10 MG/ML IV BOLUS
INTRAVENOUS | Status: AC
  Filled 2019-07-19: qty 20

## 2019-07-19 MED ORDER — FENTANYL CITRATE (PF) 250 MCG/5ML IJ SOLN
INTRAMUSCULAR | Status: AC
  Filled 2019-07-19: qty 5

## 2019-07-19 MED ORDER — PHENYLEPHRINE 40 MCG/ML (10ML) SYRINGE FOR IV PUSH (FOR BLOOD PRESSURE SUPPORT)
PREFILLED_SYRINGE | INTRAVENOUS | Status: DC | PRN
  Administered 2019-07-19: 80 ug via INTRAVENOUS

## 2019-07-19 MED ORDER — FENTANYL CITRATE (PF) 250 MCG/5ML IJ SOLN
INTRAMUSCULAR | Status: DC | PRN
  Administered 2019-07-19 (×2): 50 ug via INTRAVENOUS

## 2019-07-19 MED ORDER — OXYCODONE HCL 5 MG/5ML PO SOLN: 5.0000 mg | Freq: Once | ORAL | Status: DC | PRN

## 2019-07-19 MED ORDER — DEXAMETHASONE SODIUM PHOSPHATE 10 MG/ML IJ SOLN
INTRAMUSCULAR | Status: DC | PRN
  Administered 2019-07-19: 4 mg via INTRAVENOUS

## 2019-07-19 MED ORDER — EPINEPHRINE PF 1 MG/ML IJ SOLN
INTRAMUSCULAR | Status: DC | PRN
  Administered 2019-07-19: 1 mg

## 2019-07-19 MED ORDER — ROCURONIUM BROMIDE 10 MG/ML (PF) SYRINGE
PREFILLED_SYRINGE | INTRAVENOUS | Status: DC | PRN
  Administered 2019-07-19: 50 mg via INTRAVENOUS

## 2019-07-19 MED ORDER — PHENYLEPHRINE HCL-NACL 10-0.9 MG/250ML-% IV SOLN
INTRAVENOUS | Status: DC | PRN
  Administered 2019-07-19: 25 ug/min via INTRAVENOUS

## 2019-07-19 MED ORDER — SUGAMMADEX SODIUM 200 MG/2ML IV SOLN
INTRAVENOUS | Status: DC | PRN
  Administered 2019-07-19: 120 mg via INTRAVENOUS

## 2019-07-19 MED ORDER — 0.9 % SODIUM CHLORIDE (POUR BTL) OPTIME
TOPICAL | Status: DC | PRN
  Administered 2019-07-19: 1000 mL

## 2019-07-19 MED ORDER — LIDOCAINE 2% (20 MG/ML) 5 ML SYRINGE
INTRAMUSCULAR | Status: DC | PRN
  Administered 2019-07-19: 60 mg via INTRAVENOUS

## 2019-07-19 SURGICAL SUPPLY — 49 items
ADAPTER BRONCHOSCOPE OLYMPUS (ADAPTER) ×3 IMPLANT
ADAPTER VALVE BIOPSY EBUS (MISCELLANEOUS) IMPLANT
ADPR BSCP OLMPS EDG (ADAPTER) ×2
ADPTR VALVE BIOPSY EBUS (MISCELLANEOUS)
BLADE CLIPPER SURG (BLADE) ×2 IMPLANT
BRUSH BIOPSY BRONCH 10 SDTNB (MISCELLANEOUS) ×1 IMPLANT
BRUSH SUPERTRAX BIOPSY (INSTRUMENTS) IMPLANT
BRUSH SUPERTRAX NDL-TIP CYTO (INSTRUMENTS) ×1 IMPLANT
CANISTER SUCT 3000ML PPV (MISCELLANEOUS) ×3 IMPLANT
CNTNR URN SCR LID CUP LEK RST (MISCELLANEOUS) ×4 IMPLANT
CONT SPEC 4OZ STRL OR WHT (MISCELLANEOUS) ×6
COVER BACK TABLE 60X90IN (DRAPES) ×3 IMPLANT
FILTER SMOKE EVAC ULPA (FILTER) ×2 IMPLANT
FILTER STRAW FLUID ASPIR (MISCELLANEOUS) ×1 IMPLANT
FORCEPS BIOP SUPERTRX PREMAR (INSTRUMENTS) ×1 IMPLANT
GAUZE SPONGE 4X4 12PLY STRL (GAUZE/BANDAGES/DRESSINGS) ×2 IMPLANT
GLOVE BIO SURGEON STRL SZ 6.5 (GLOVE) ×5 IMPLANT
GOWN STRL REUS W/ TWL LRG LVL3 (GOWN DISPOSABLE) ×4 IMPLANT
GOWN STRL REUS W/TWL LRG LVL3 (GOWN DISPOSABLE) ×9
KIT CLEAN ENDO COMPLIANCE (KITS) ×4 IMPLANT
KIT ILLUMISITE 180 PROCEDURE (KITS) ×1 IMPLANT
KIT ILLUMISITE 90 PROCEDURE (KITS) IMPLANT
KIT MARKER FIDUCIAL DELIVERY (KITS) ×1 IMPLANT
KIT TURNOVER KIT B (KITS) ×3 IMPLANT
MARKER FIDUCIAL SL NIT COIL (Implant Marker) ×3 IMPLANT
MARKER SKIN DUAL TIP RULER LAB (MISCELLANEOUS) ×3 IMPLANT
NDL SUPERTRX PREMARK BIOPSY (NEEDLE) IMPLANT
NEEDLE SUPERTRX PREMARK BIOPSY (NEEDLE) ×3 IMPLANT
NS IRRIG 1000ML POUR BTL (IV SOLUTION) ×3 IMPLANT
OIL SILICONE PENTAX (PARTS (SERVICE/REPAIRS)) ×3 IMPLANT
PAD ARMBOARD 7.5X6 YLW CONV (MISCELLANEOUS) ×6 IMPLANT
PATCHES PATIENT (LABEL) ×9 IMPLANT
PENCIL SMOKE EVACUATOR (MISCELLANEOUS) ×2 IMPLANT
SLEEVE SUCTION 125 (MISCELLANEOUS) ×2 IMPLANT
SYR 20ML ECCENTRIC (SYRINGE) ×4 IMPLANT
SYR 20ML LL LF (SYRINGE) IMPLANT
SYR 3ML LL SCALE MARK (SYRINGE) ×3 IMPLANT
SYSTEM GENCUT CORE BIOPSY (NEEDLE) IMPLANT
TOWEL GREEN STERILE (TOWEL DISPOSABLE) ×3 IMPLANT
TOWEL GREEN STERILE FF (TOWEL DISPOSABLE) ×3 IMPLANT
TRAP FLUID SMOKE EVACUATOR (MISCELLANEOUS) ×2 IMPLANT
TRAP SPECIMEN MUCOUS 40CC (MISCELLANEOUS) ×3 IMPLANT
TUBE CONNECTING 12X1/4 (SUCTIONS) ×2 IMPLANT
TUBE CONNECTING 20X1/4 (TUBING) ×2 IMPLANT
UNDERPAD 30X30 (UNDERPADS AND DIAPERS) ×3 IMPLANT
VALVE BIOPSY  SINGLE USE (MISCELLANEOUS) ×3
VALVE BIOPSY SINGLE USE (MISCELLANEOUS) ×2 IMPLANT
VALVE SUCTION BRONCHIO DISP (MISCELLANEOUS) ×3 IMPLANT
WATER STERILE IRR 1000ML POUR (IV SOLUTION) ×3 IMPLANT

## 2019-07-19 NOTE — Discharge Instructions (Signed)
Flexible Bronchoscopy, Care After This sheet gives you information about how to care for yourself after your procedure. Your health care provider may also give you more specific instructions. If you have problems or questions, contact your health care provider. What can I expect after the procedure? After the procedure, it is common to have the following symptoms for 24-48 hours:  A cough that is worse than it was before the procedure.  A low-grade fever.  A sore throat or hoarse voice.  Small streaks of blood in the mucus from your lungs (sputum), if tissue samples were removed (biopsy). Follow these instructions at home: Eating and drinking  Do not eat or drink anything (including water) for 2 hours after your procedure, or until your numbing medicine (local anesthetic) has worn off. Having a numb throat increases your risk of burning yourself or choking.  After your numbness is gone and your cough and gag reflexes have returned, you may start eating only soft foods and slowly drinking liquids.  The day after the procedure, return to your normal diet. Driving  Do not drive for 24 hours if you were given a medicine to help you relax (sedative).  Do not drive or use heavy machinery while taking prescription pain medicine. General instructions   Take over-the-counter and prescription medicines only as told by your health care provider.  Return to your normal activities as told by your health care provider. Ask your health care provider what activities are safe for you.  Do not use any products that contain nicotine or tobacco, such as cigarettes and e-cigarettes. If you need help quitting, ask your health care provider.  Keep all follow-up visits as told by your health care provider. This is important, especially if you had a biopsy taken. Get help right away if:  You have shortness of breath that gets worse.  You become light-headed or feel like you might faint.  You have  chest pain.  You cough up more than a small amount of blood.  The amount of blood you cough up increases. Summary  Common symptoms in the 24-48 hours following a flexible bronchoscopy include cough, low-grade fever, sore throat or hoarse voice, and blood-streaked mucus from the lungs (if you had a biopsy).  Do not eat or drink anything (including water) for 2 hours after your procedure, or until your local anesthetic has worn off. You can return to your normal diet the day after the procedure.  Get help right away if you develop worsening shortness of breath, have chest pain, become light-headed, or cough up more than a small amount of blood. This information is not intended to replace advice given to you by your health care provider. Make sure you discuss any questions you have with your health care provider. Document Revised: 03/26/2017 Document Reviewed: 05/01/2016 Elsevier Patient Education  2020 [employer].

## 2019-07-19 NOTE — Brief Op Note (Signed)
      [address]       [place] [address]             [phone]      07/19/2019  12:39 PM  PATIENT:  Courtney Bowen  74 y.o. female  PRE-OPERATIVE DIAGNOSIS:  Lung Nodule- left upper lobe   POST-OPERATIVE DIAGNOSIS:  Same path pending   PROCEDURE:  Procedure(s): VIDEO BRONCHOSCOPY WITH ENDOBRONCHIAL NAVIGATION with Biopsy, Fuducial placement (N/A) PLACEMENT OF FUDUCIAL (N/A) LUNG BIOPSY (Left)  SURGEON:  Surgeon(s) and Role:    * [name], MD - Primary   ANESTHESIA:   general  EBL:  5 mL   BLOOD ADMINISTERED:none  DRAINS: none   LOCAL MEDICATIONS USED:  NONE  SPECIMEN:  Source of Specimen:  left upper lobe   DISPOSITION OF SPECIMEN:  and cytology  COUNTS:  YES  DICTATION: .Dragon Dictation  PLAN OF CARE: Discharge to home after PACU  PATIENT DISPOSITION:  PACU - hemodynamically stable.   Delay start of Pharmacological VTE agent (>24hrs) due to surgical blood loss or risk of bleeding: yes

## 2019-07-19 NOTE — Anesthesia Procedure Notes (Signed)
Procedure Name: Intubation Date/Time: 07/19/2019 11:03 AM Performed by: Wilburn Cornelia, CRNA Pre-anesthesia Checklist: Patient identified, Emergency Drugs available, Suction available, Patient being monitored and Timeout performed Patient Re-evaluated:Patient Re-evaluated prior to induction Oxygen Delivery Method: Circle system utilized Preoxygenation: Pre-oxygenation with 100% oxygen Induction Type: IV induction Ventilation: Mask ventilation without difficulty Laryngoscope Size: Mac and 3 Grade View: Grade IV Tube type: Oral Tube size: 8.5 mm Number of attempts: 1 Airway Equipment and Method: Stylet Placement Confirmation: ETT inserted through vocal cords under direct vision,  positive ETCO2,  CO2 detector and breath sounds checked- equal and bilateral Secured at: 22 cm Tube secured with: Tape Dental Injury: Teeth and Oropharynx as per pre-operative assessment

## 2019-07-19 NOTE — H&P (Signed)
[address]       [place], [address]             [phone]                    Cyrene B Zarrella [place] Medical Record #[id number] Date of Birth: 1945-07-26  Referring: Ladell Pier, MD Primary Care: Lajean Manes, MD Primary Cardiologist: No primary care provider on file.  Chief Complaint:    Chief Complaint  Patient presents with  . Lung Lesion    LULobe...4 month f/u with Chest CT-Super D    History of Present Illness:    Courtney Bowen 74 y.o. female is seen in the office   for  Multiple lung nodules.    Patient in the past was a long-term smoker for more than 30 years but notes she stopped 7 years ago.  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  She has a history of lung nodules -CT scan in February 2015 noted an 8 x 11 mm spiculated right upper lobe lung nodule.  The  CT scan of the chest 02/14/2019-showed continued interval growth of irregular shaped bilobed left upper lobe pulmonary nodule, the right upper lobe lung nodule has remained stable most recently measured at 9 x 5 mm.    She has a history of diabetes and hypertension  Patient had  with a follow-up super D CT of the chest.  Previously she was not in interested in any further intervention but did agree to a follow-up CT   Cancer Staging Cancer of ascending colon [facility]) Staging form: Colon and Rectum, AJCC 8th Edition - Clinical: Stage IIA (cT3, cN0, cM0) - Signed by Ladell Pier, MD on 07/27/2017  Current Activity/ Functional Status:  Patient is independent with mobility/ambulation, transfers, ADL's, IADL's.   Zubrod Score: At the time of surgery this patient's most appropriate activity status/level should be described  as: []     0    Normal activity, no symptoms [x]     1    Restricted in physical strenuous activity but ambulatory, able to do out light work []     2    Ambulatory and capable of self care, unable to do work activities, up and about               >50 % of waking hours                              []     3    Only limited self care, in bed greater than 50% of waking hours []     4    Completely disabled, no self care, confined to bed or chair []     5    Moribund   Past Medical History:  Diagnosis Date  . Cancer ([place]) 07/08/12   right tonsil  . Colon cancer ([place]) dx'd 2016  . Diabetes mellitus without complication ([place])   . GERD (gastroesophageal reflux disease)   . Gout   . Hypercholesterolemia   . Hypertension    not on any meds  . Renal insufficiency    history of  . 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 Status post radiation therapy  08/17/11-10/07/12   squamous cell of tonsil  . Submandibular sialolithiasis    right    Past Surgical History:  Procedure Laterality Date  . BREAST BIOPSY     hx of  . CHOLECYSTECTOMY  20 yrs ago   laproscopic  . COLONOSCOPY    . TONSILLECTOMY    . WISDOM TOOTH EXTRACTION      Family History  Problem Relation Age of Onset  . Stroke Mother   . Stroke Father   . Diabetes Brother      Social History   Tobacco Use  Smoking Status Former Smoker  . Packs/day: 1.50  . Years: 45.00  . Pack years: 67.50  . Types: Cigarettes  . Quit date: 07/07/2012  . Years since quitting: 7.0  Smokeless Tobacco Never Used  Tobacco Comment   1-1.5 ppd    Social History   Substance and Sexual Activity  Alcohol Use No   Comment: socially yrs ago     Allergies  Allergen Reactions  . Amoxicillin Nausea And Vomiting    It makes her very sick Did it involve swelling of the face/tongue/throat, SOB, or low BP? No Did it involve sudden or severe rash/hives, skin peeling, or any reaction on the inside of your mouth or nose? No Did  you need to seek medical attention at a hospital or doctor's office? Unknown When did it last happen?20 years ago If all above answers are "NO", may proceed with cephalosporin use.   . Erythromycin Nausea And Vomiting    No current facility-administered medications for this encounter.      Review of Systems:     Cardiac Review of Systems: [Y] = yes  or   [ N ] = no   Chest Pain [n   ]  Resting SOB [ n] Exertional SOB  [ y ]  Vertell Limber Florencio.Farrier ]   Pedal Edema [ n  ]    Palpitations [n ] Syncope  [ n ]   Presyncope [  n]   General Review of Systems: [Y] = yes [  ]=no Constitional: recent weight change [  ];  Wt loss over the last 3 months [   ] anorexia [  ]; fatigue [  ]; nausea [  ]; night sweats [  ]; fever [  ]; or chills [  ];           Eye : blurred vision [  ]; diplopia [   ]; vision changes [  ];  Amaurosis fugax[  ]; Resp: cough [  ];  wheezing[  ];  hemoptysis[  ]; shortness of breath[  ]; paroxysmal nocturnal dyspnea[  ]; dyspnea on exertion[  ]; or orthopnea[  ];  GI:  gallstones[  ], vomiting[  ];  dysphagia[  ]; melena[  ];  hematochezia [  ]; heartburn[  ];   Hx of  Colonoscopy[  ]; GU: kidney stones [  ]; hematuria[  ];   dysuria [  ];  nocturia[  ];  history of     obstruction [  ]; urinary frequency [  ]             Skin: rash, swelling[  ];, hair loss[  ];  peripheral edema[  ];  or itching[  ]; Musculosketetal: myalgias[  ];  joint swelling[  ];  joint erythema[  ];  joint pain[  ];  back pain[  ];  Heme/Lymph: bruising[  ];  bleeding[  ];  anemia[  ];  Neuro: TIA[  ];  headaches[  ];  stroke[  ];  vertigo[  ];  seizures[  ];   paresthesias[  ];  difficulty walking[  ];  Psych:depression[  ]; anxiety[  ];  Endocrine: diabetes[  ];  thyroid dysfunction[  ];  Immunizations: Flu up to date [  ]; Pneumococcal up to date [  ];  Other:     PHYSICAL EXAMINATION: There were no vitals taken for this visit. General appearance: alert, cooperative and no distress Head:  Normocephalic, without obvious abnormality, atraumatic Neck: no adenopathy, no carotid bruit, no JVD, supple, symmetrical, trachea midline and thyroid not enlarged, symmetric, no tenderness/mass/nodules, right neck is firmer then left side, no mass Lymph nodes: Cervical, supraclavicular, and axillary nodes normal. Resp: clear to auscultation bilaterally Cardio: regular rate and rhythm, S1, S2 normal, no murmur, click, rub or gallop GI: soft, non-tender; bowel sounds normal; no masses,  no organomegaly Extremities: extremities normal, atraumatic, no cyanosis or edema Neurologic: Grossly normal    Diagnostic Studies & Laboratory data:     Recent Radiology Findings: CT Super D Chest Wo Contrast  Result Date: 07/13/2019 CLINICAL DATA:  Follow-up pulmonary nodules. History of squamous cell cancer right tonsil with radiation therapy. EXAM: CT CHEST WITHOUT CONTRAST TECHNIQUE: Multidetector CT imaging of the chest was performed using thin slice collimation for electromagnetic bronchoscopy planning purposes, without intravenous contrast. COMPARISON:  02/14/2019 FINDINGS: Cardiovascular: Calcified coronary artery disease similar to prior study. Calcified atherosclerosis of the thoracic aorta is unchanged, no signs of aneurysm. Heart size is normal without pericardial effusion. Central pulmonary vasculature is of normal caliber. Mediastinum/Nodes: Scattered mediastinal lymph nodes, unenlarged and unchanged. No axillary adenopathy. Thoracic inlet structures are normal. Esophagus mildly patulous. Lungs/Pleura: Signs of biapical scarring related to prior radiation therapy, stable. Nine by 5 mm right upper lobe pulmonary nodule (image 22, series 9) unchanged since the previous exam. Pleural and parenchymal scarring along the periphery of the right chest elsewhere is also similar. Left upper lobe pulmonary nodule (image 78, series 9) 12 x 8 mm, previously 11 x 8 mm. This measured approximately 11 by 5 mm on the  study of August 19, 2018. Signs of emphysema and basilar scarring worse on the left as before. Upper Abdomen: Incidental imaging of upper abdominal contents shows no acute finding. Changes of chronic calcific pancreatitis likely associated with intraductal calculi are partially imaged. Musculoskeletal: No evidence of acute bone finding or destructive bone process. IMPRESSION: 1. Slight interval enlargement of left upper lobe pulmonary nodule, now measuring 12 x 8 mm, previously 11 x 5 mm. This is more solid appearing than on the study of July 16, 2017 and has enlarged in mean diameter since the study of April of 2020. Given persistence this remains concerning for neoplasm. Given low level uptake on PET indolent process is favored. 2. Stable appearance of right upper lobe pulmonary nodule. 3. Stable changes of emphysema and basilar scarring. 4. Calcified coronary artery disease. 5. 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 Aortic Atherosclerosis (ICD10-I70.0) and Emphysema (ICD10-J43.9). Electronically Signed   By: Zetta Bills M.D.   On: 07/13/2019 13:57   Ct Chest Wo Contrast  Result Date: 02/14/2019 CLINICAL DATA:  73 year old female with history of colon cancer presenting with lung nodules. EXAM: CT CHEST WITHOUT CONTRAST TECHNIQUE: Multidetector CT imaging of the chest was performed following the standard protocol without IV contrast. COMPARISON:  Chest CT 08/19/2018. FINDINGS: Cardiovascular: Heart size  is normal. There is no significant pericardial fluid, thickening or pericardial calcification. There is aortic atherosclerosis, as well as atherosclerosis of the great vessels of the mediastinum and the coronary arteries, including calcified atherosclerotic plaque in the left main, left anterior descending and left circumflex coronary arteries. Mediastinum/Nodes: No pathologically enlarged mediastinal or hilar lymph nodes. Please note that accurate exclusion of  hilar adenopathy is limited on noncontrast CT scans. Esophagus is unremarkable in appearance. No axillary lymphadenopathy. Lungs/Pleura: The previously noted bilobed nodule in the left upper lobe continues increased in size slightly, currently measuring 14 x 11 x 8 mm (coronal image 48 of series 5 and axial image 88 of series 7), previously 10 x 5 x 7 mm on 08/19/2018. 9 x 5 mm right upper lobe pulmonary nodule (axial image 31 of series 7), stable. Linear scarring in the left lower lobe is unchanged. No acute consolidative airspace disease. No pleural effusions. Diffuse bronchial wall thickening with moderate centrilobular and paraseptal emphysema. Upper Abdomen: Aortic atherosclerosis. Musculoskeletal: There are no aggressive appearing lytic or blastic lesions noted in the visualized portions of the skeleton. IMPRESSION: 1. Continued interval growth of irregular-shaped bilobed left upper lobe pulmonary nodule, as discussed above. Further evaluation with PET-CT is recommended to exclude malignancy. 2. Diffuse bronchial wall thickening with moderate centrilobular and paraseptal emphysema; imaging findings suggestive of underlying COPD. 3. Aortic atherosclerosis, in addition to left main and 2 vessel coronary artery disease. Assessment for potential risk factor modification, dietary therapy or pharmacologic therapy may be warranted, if clinically indicated. Aortic Atherosclerosis (ICD10-I70.0) and Emphysema (ICD10-J43.9). Electronically Signed   By: Vinnie Langton M.D.   On: 02/14/2019 14:23   Nm Pet Image Restag (ps) Skull Base To Thigh  Result Date: 03/03/2019 CLINICAL DATA:  Subsequent treatment strategy for pulmonary nodule and history of colon cancer and tonsillar cancer. EXAM: NUCLEAR MEDICINE PET SKULL BASE TO THIGH TECHNIQUE: 6.24 mCi F-18 FDG was injected intravenously. Full-ring PET imaging was performed from the skull base to thigh after the radiotracer. CT data was obtained and used for attenuation  correction and anatomic localization. Fasting blood glucose: 121 mg/dl COMPARISON:  08/31/2018 FINDINGS: Mediastinal blood pool activity: SUV max 2.6 Liver activity: SUV max 3.0 NECK: No hypermetabolic lymph nodes in the neck. Incidental CT findings: No signs of abnormal intracranial findings with limited assessment. No adenopathy by size criteria in the neck. Calcified atherosclerotic changes in the carotid vasculature. Airway is unremarkable. Symmetric glottic activity as before. Stable appearance of asymmetric parotid activity without visible lesion. Max SUV of 5. Contralateral max SUV of 4.4. CHEST: Diffuse hypermetabolic paraspinal and intercostal activity with symmetric appearance unchanged from previous study. Stable areas of subpleural reticulation bilaterally. Similar appearance of pulmonary nodule in the lingula measuring 11 x 6 mm. Max SUV 1.2 The vicinity of a right apical pulmonary nodule measuring approximately 8 x 3 mm there is no added FDG uptake. Peripheral ground-glass opacity in the left upper lobe with vague increased FDG uptake, max SUV 1.1. Incidental CT findings: Signs of aortic atherosclerosis, coronary artery disease and pulmonary emphysema with biapical scarring similar to prior study. No signs of adenopathy or chest wall mass. ABDOMEN/PELVIS: No abnormal hypermetabolic activity within the liver, pancreas, adrenal glands, or spleen. No hypermetabolic lymph nodes in the abdomen or pelvis. Incidental CT findings: Signs of chronic pancreatitis and biliary ductal dilation are unchanged compared to most recent prior and little changed dating back to 2014. Liver is normal. Spleen, adrenal glands and kidneys are unremarkable. No signs of  acute bowel process. Dense calcific atherosclerotic changes throughout a nonaneurysmal abdominal aorta. No signs of acute process in the abdomen or pelvis. SKELETON: No focal hypermetabolic activity to suggest skeletal metastasis. Incidental CT findings: No signs  of acute or destructive bone process. Spinal degenerative changes. IMPRESSION: 1. Stable lingular nodule and left upper lobe ground-glass. Continued attention on follow-up is suggested, chest CT in 6 months may be helpful for continued surveillance. 2. Extensive brown fat activity in the upper retroperitoneum, paraspinous region extending towards the thoracic inlet. Benign finding. 3. Signs of atherosclerosis, emphysema, coronary artery calcification and chronic calcific pancreatitis are similar to prior studies. 4. Aortic Atherosclerosis (ICD10-I70.0) and Emphysema (ICD10-J43.9). Electronically Signed   By: Zetta Bills M.D.   On: 03/03/2019 16:28        I have independently reviewed the above radiology studies  and reviewed the findings with the patient.   Recent Lab Findings: Lab Results  Component Value Date   WBC 4.1 07/17/2019   HGB 11.8 (L) 07/17/2019   HCT 35.1 (L) 07/17/2019   PLT 234 07/17/2019   GLUCOSE 112 (H) 07/17/2019   ALT 14 07/17/2019   AST 21 07/17/2019   NA 129 (L) 07/17/2019   K 4.3 07/17/2019   CL 95 (L) 07/17/2019   CREATININE 0.96 07/17/2019   BUN 7 (L) 07/17/2019   CO2 23 07/17/2019   TSH 2.574 10/04/2013   INR 1.0 07/17/2019   HGBA1C 6.8 (H) 07/17/2019   pft'S fev1 1.82 92% dlco 12.04 58%   Assessment / Plan:   Patient with underlying centrilobular emphysema, long-term smoking history, with bilateral pulmonary nodules, but with a left upper lobe nodule which is slowly increasing in size and become more dense.  The patient has history of both tonsillar cancer and adenocarcinoma of the colon- I have again reviewed with the patient her scans including the most recent CT and  Previous  PET scan.  She is very reluctant to agree to any interventional or invasive procedure.  We discussed the options of navigation bronchoscopy and biopsy with possible fiducial markers as an option to surgical resection.  The PET scan shows low level activity in the area of the  left upper lobe/lingula, however with the slow progression in size it would not be surprising that this area had low activity.  With continued slow enlargement over the left upper lobe lesion and becoming more solid in appearance, again recommend to the patient that we at least try to obtain a tissue diagnosis.  She is adamant about not having any surgery for removal but is willing to proceed with navigation bronchoscopy attempted biopsy placement of fiducials and possibly stereotactic radiotherapy if warranted.  The goals risks and alternatives of the planned surgical procedure Procedure(s): [place] with Biopsy, Fuducial placement (N/A) PLACEMENT OF FUDUCIAL (N/A) LUNG BIOPSY (Left)  have been discussed with the patient in detail. The risks of the procedure including death, infection, stroke, myocardial infarction, bleeding, blood transfusion, pneumothorax  have all been discussed specifically.  I have quoted Mack Hook Kreutzer a 1 % of perioperative mortality and a complication rate as high as 10 %. [name] questions have been answered.Mack Hook Cooner is willing  to proceed with the planned procedure.  [name] MD      [address] [place] [address] Office [phone]     07/19/2019 10:13 AM

## 2019-07-19 NOTE — Transfer of Care (Signed)
Immediate Anesthesia Transfer of Care Note  Patient: Courtney Bowen  Procedure(s) Performed: VIDEO BRONCHOSCOPY WITH ENDOBRONCHIAL NAVIGATION with Biopsy, Fuducial placement (N/A ) PLACEMENT OF FUDUCIAL (N/A ) LUNG BIOPSY (Left )  Patient Location: PACU  Anesthesia Type:General  Level of Consciousness: awake, alert  and oriented  Airway & Oxygen Therapy: Patient Spontanous Breathing and Patient connected to nasal cannula oxygen  Post-op Assessment: Report given to RN and Post -op Vital signs reviewed and stable  Post vital signs: Reviewed and stable  Last Vitals:  Vitals Value Taken Time  BP    Temp    Pulse 84 07/19/19 1232  Resp 14 07/19/19 1232  SpO2 100 % 07/19/19 1232  Vitals shown include unvalidated device data.  Last Pain:  Vitals:   07/19/19 1039  TempSrc:   PainSc: 0-No pain      Patients Stated Pain Goal: 3 (97/41/63 8453)  Complications: No apparent anesthesia complications

## 2019-07-20 ENCOUNTER — Encounter: Payer: Self-pay | Admitting: *Deleted

## 2019-07-20 LAB — CYTOLOGY - NON PAP

## 2019-07-21 NOTE — Anesthesia Postprocedure Evaluation (Signed)
Anesthesia Post Note  Patient: Courtney Bowen  Procedure(s) Performed: VIDEO BRONCHOSCOPY WITH ENDOBRONCHIAL NAVIGATION with Biopsy, Fuducial placement (N/A ) PLACEMENT OF FUDUCIAL (N/A ) LUNG BIOPSY (Left )     Patient location during evaluation: PACU Anesthesia Type: General Level of consciousness: awake and alert Pain management: pain level controlled Vital Signs Assessment: post-procedure vital signs reviewed and stable Respiratory status: spontaneous breathing, nonlabored ventilation, respiratory function stable and patient connected to nasal cannula oxygen Cardiovascular status: blood pressure returned to baseline and stable Postop Assessment: no apparent nausea or vomiting Anesthetic complications: no    Last Vitals:  Vitals:   07/19/19 1247 07/19/19 1302  BP: (!) 152/79   Pulse: 79   Resp: 19   Temp:  36.6 C  SpO2: 100%     Last Pain:  Vitals:   07/19/19 1302  TempSrc:   PainSc: 0-No pain                 Luvena Wentling S

## 2019-07-24 LAB — SURGICAL PATHOLOGY

## 2019-07-24 NOTE — Op Note (Signed)
NAME: RYANA, MONTECALVO MEDICAL RECORD [id number] ACCOUNT [id number] DATE OF BIRTH:[birth year] FACILITY: [facility] LOCATION: [facility] PHYSICIAN:Dyanna Seiter BServando Snare, MD  OPERATIVE REPORT  DATE OF PROCEDURE:  07/19/2019  PREOPERATIVE DIAGNOSIS:  Slowly enlarging left upper lobe nodule with history of colon and tonsillar cancer in the past.  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  SURGICAL PROCEDURE:  Navigation bronchoscopy with brushings and biopsy of left upper lobe lung nodule and placement of fiducial markers.  SURGEON:  Lanelle Bal, MD  BRIEF HISTORY:  The patient is a 74 year old female with previous history of tonsillar cancer treated with radiation and colon cancer.  She has been followed by Dr. Malachy Mood with CT scan showing a slowly enlarging left upper lobe bilobed lung mass.  The  patient had been seeing [place] for consideration of biopsy and/or surgical resection.  She refused any consideration of surgical intervention, but with most recent CT scan was agreeable to obtaining a tissue biopsy and a placement of fiducials.  She had  stereotactic radiotherapy considered.  Risks and options of surgery were discussed with the patient in detail and she was willing to proceed.  DESCRIPTION OF PROCEDURE:  The patient underwent general endotracheal anesthesia without incident.  A single lumen endotracheal tube was placed.  Through this tube and after appropriate timeout was performed the 2.8 mm video bronchoscope was passed to  the subsegmental level on the right and left tracheobronchial tree.  There was no evidence of endobronchial lesions.  We then placed a working channel and LG sensor through the bronchoscope registering the Super D system with the preop created navigation  plan.  With this then done, we proceeded to navigate to the left upper lobe nodule that was fairly peripherally located.  We were easily  able to get within 2 cm of the lesion and then used fluoroscopy and further Super D navigation to confirm the  location of the lesion.  With the fine tuning of the site with fluoroscopic mapping also, we then proceeded with series of aspirations with a needle at the site, used needle brush, triple brush and took a small biopsy forceps.  As we performed this, we  were careful to maintain our alignment with the Super D system.  Initial quick stains did not confirming the malignancy.  After we obtained an adequate amount of tissue from biopsies we then proceeded with placement of fiducial markers.  The Super D  system mapped placement of fiducials.  We then navigated to each of these sites depositing a small fiducial under fluoroscopic guidance.  At the completion of the procedure the tracheobronchial tree was suctioned of all secretions.  There was no  significant bleeding.  Fluoroscopy at the completion of the case showed the left lung to be fully inflated without evidence of pneumothorax.  The patient was then awakened and extubated in the operating room and transferred to the recovery room for  further postoperative care.  CN/NUANCE  D:07/23/2019 T:07/24/2019 JOB:[id number]

## 2019-07-25 DIAGNOSIS — Z1389 Encounter for screening for other disorder: Secondary | ICD-10-CM | POA: Diagnosis not present

## 2019-07-25 DIAGNOSIS — Z0000 Encounter for general adult medical examination without abnormal findings: Secondary | ICD-10-CM | POA: Diagnosis not present

## 2019-07-25 DIAGNOSIS — I129 Hypertensive chronic kidney disease with stage 1 through stage 4 chronic kidney disease, or unspecified chronic kidney disease: Secondary | ICD-10-CM | POA: Diagnosis not present

## 2019-07-25 DIAGNOSIS — I671 Cerebral aneurysm, nonruptured: Secondary | ICD-10-CM | POA: Diagnosis not present

## 2019-07-25 DIAGNOSIS — E1169 Type 2 diabetes mellitus with other specified complication: Secondary | ICD-10-CM | POA: Diagnosis not present

## 2019-07-25 DIAGNOSIS — K219 Gastro-esophageal reflux disease without esophagitis: Secondary | ICD-10-CM | POA: Diagnosis not present

## 2019-07-25 DIAGNOSIS — E222 Syndrome of inappropriate secretion of antidiuretic hormone: Secondary | ICD-10-CM | POA: Diagnosis not present

## 2019-07-25 DIAGNOSIS — N1831 Chronic kidney disease, stage 3a: Secondary | ICD-10-CM | POA: Diagnosis not present

## 2019-07-25 DIAGNOSIS — Z7984 Long term (current) use of oral hypoglycemic drugs: Secondary | ICD-10-CM | POA: Diagnosis not present

## 2019-07-27 ENCOUNTER — Other Ambulatory Visit: Payer: Self-pay

## 2019-07-27 ENCOUNTER — Encounter: Payer: Self-pay | Admitting: Cardiothoracic Surgery

## 2019-07-27 ENCOUNTER — Ambulatory Visit (INDEPENDENT_AMBULATORY_CARE_PROVIDER_SITE_OTHER): Payer: Medicare Other | Admitting: Cardiothoracic Surgery

## 2019-07-27 VITALS — BP 134/73 | HR 86 | Temp 97.3°F | Resp 16 | Ht 66.0 in | Wt 121.7 lb

## 2019-07-27 DIAGNOSIS — D381 Neoplasm of uncertain behavior of trachea, bronchus and lung: Secondary | ICD-10-CM

## 2019-07-27 DIAGNOSIS — C182 Malignant neoplasm of ascending colon: Secondary | ICD-10-CM

## 2019-07-27 DIAGNOSIS — Z85819 Personal history of malignant neoplasm of unspecified site of lip, oral cavity, and pharynx: Secondary | ICD-10-CM | POA: Diagnosis not present

## 2019-07-27 NOTE — Progress Notes (Signed)
[address]       [place] [address]             [phone]      Courtney Bowen Medical Record #[id number] Date of Birth: 04/14/1946  Referring: Courtney Pier, MD Primary Care: Courtney Manes, MD Primary Cardiologist: Courtney Bowen.   Chief Complaint:   POST OP FOLLOW UP OPERATIVE REPORT DATE OF PROCEDURE:  07/19/2019 PREOPERATIVE DIAGNOSIS:  Slowly enlarging left upper lobe nodule with history of colon and tonsillar cancer in the past. 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 SURGICAL PROCEDURE:  Navigation bronchoscopy with brushings and biopsy of left upper lobe lung nodule and placement of fiducial markers. Clinical History: lung nodule (cm)    FINAL MICROSCOPIC DIAGNOSIS:   A. LUNG, LEFT UPPER LOBE, BIOPSY:  - Scant lung tissue with atypia  - Courtney overt malignancy  - See comment  COMMENT:  Immunohistochemistry (TTF-1, CDX2) was performed to exclude a small  infiltrative cell population; there is Courtney evidence in the submitted  material. Dr. Vic Bowen reviewed the case and agrees with the above  diagnosis.  GROSS DESCRIPTION:  Received in saline is a 0.51 0.1 x 0.1 cm aggregate of pink-red soft  tissue fragments, submitted in 1 cassette. (AK 07/19/2019)   Final Diagnosis performed by Courtney Sheller, MD.   History of Present Illness:          Past Medical History:  Diagnosis Date  . Cancer ([place]) 07/08/12   right tonsil  . Cataract    B/L  . Colon cancer ([place]) dx'd 2016  . COPD (chronic obstructive pulmonary disease) ([place])   . Diabetes mellitus without complication ([place])   . GERD (gastroesophageal reflux disease)   . Gout   . Hypercholesterolemia   . Hypertension    not on any meds  . Lung nodule   . Pneumonia   . Renal insufficiency    history of  . 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  Status post radiation therapy within last four weeks 08/17/11-10/07/12   squamous cell of tonsil  . Submandibular sialolithiasis    right  . Wears glasses      Social History   Tobacco Use  Smoking Status Former Smoker  . Packs/day: 1.50  . Years: 45.00  . Pack years: 67.50  . Types: Cigarettes  . Quit date: 07/07/2012  . Years since quitting: 7.0  Smokeless Tobacco Never Used  Tobacco Comment   1-1.5 ppd    Social History   Substance and Sexual Activity  Alcohol Use Courtney   Comment: socially yrs ago     Allergies  Allergen Reactions  . Amoxicillin Nausea And Vomiting    It makes her very sick Did it involve swelling of the face/tongue/throat, SOB, or low BP? Courtney Did it involve sudden or severe rash/hives, skin peeling, or any reaction on the inside of your mouth or nose? Courtney Did you need to seek medical attention at a hospital or doctor's office? Unknown When did it last happen?20 years ago If all above answers are "Courtney", may proceed with cephalosporin use.   . Erythromycin Nausea And Vomiting    Current Outpatient Medications  Medication Sig Dispense Refill  . alendronate (FOSAMAX) 70 MG tablet Take 70 mg by mouth once a week.    Marland Kitchen amLODipine (NORVASC) 2.5 MG  tablet Take 2.5 mg by mouth every evening.     Marland Kitchen aspirin EC 81 MG tablet Take 81 mg by mouth daily.     Marland Kitchen atorvastatin (LIPITOR) 10 MG tablet Take 20 mg by mouth daily.     . cholecalciferol (VITAMIN D) 400 UNITS TABS Take 400 Units by mouth daily.     Marland Kitchen docusate sodium (COLACE) 100 MG capsule Take 100 mg by mouth in the morning and at bedtime.    Marland Kitchen losartan (COZAAR) 50 MG tablet Take 50 mg by mouth every evening    . nicotine polacrilex (NICORETTE) 4 MG gum Take 4 mg by mouth as needed for smoking cessation.     Marland Kitchen omeprazole (PRILOSEC) 10 MG capsule Take 10 mg by mouth daily.    . polyethylene glycol (MIRALAX / GLYCOLAX) packet Take 17 g by mouth daily as needed.      Courtney current facility-administered  medications for this visit.       Physical Exam: BP 134/73 (BP Location: Left Arm, Patient Position: Sitting, Cuff Size: Normal)   Pulse 86   Temp (!) 97.3 F (36.3 C)   Resp 16   Ht 5\' 6"  (1.676 m)   Wt 121 lb 11.1 oz (55.2 kg)   SpO2 99% Comment: RA  BMI 19.64 kg/m   General appearance: alert and cooperative Neurologic: intact Heart: regular rate and rhythm, S1, S2 normal, Courtney murmur, click, rub or gallop Lungs: clear to auscultation bilaterally Abdomen: soft, non-tender; bowel sounds normal; Courtney masses,  Courtney organomegaly Extremities: extremities normal, atraumatic, Courtney cyanosis or edema   Diagnostic Studies & Laboratory data:     Recent Radiology Findings:      Recent Lab Findings: Lab Results  Component Value Date   WBC 4.1 07/17/2019   HGB 11.8 (L) 07/17/2019   HCT 35.1 (L) 07/17/2019   PLT 234 07/17/2019   GLUCOSE 112 (H) 07/17/2019   ALT 14 07/17/2019   AST 21 07/17/2019   NA 129 (L) 07/17/2019   K 4.3 07/17/2019   CL 95 (L) 07/17/2019   CREATININE 0.96 07/17/2019   BUN 7 (L) 07/17/2019   CO2 23 07/17/2019   TSH 2.574 10/04/2013   INR 1.0 07/17/2019   HGBA1C 6.8 (H) 07/17/2019      Assessment / Plan:   Patient comes in today to discuss the findings of her recent navigation bronchoscopy-her path findings radiographic findings were reviewed at the multidisciplinary thoracic oncology conference this morning.  Particularly with radiation oncology [place] opinion.  It is noted the patient has repeatedly refused to consider surgical resection.  At this point the consensus was to obtain a follow-up CT scan in 4 months and if we see continued enlargement proceed with stereotactic radiotherapy to the lesion in the left lung.     Medication Changes: Courtney orders of the defined types were placed in this encounter.     Courtney Isaac MD      [address] [place] [address] Office [phone]     07/27/2019 12:40 PM

## 2019-07-28 ENCOUNTER — Other Ambulatory Visit: Payer: Self-pay | Admitting: *Deleted

## 2019-07-28 NOTE — Progress Notes (Signed)
The proposed treatment discussed in cancer conference 07/27/19 is for discussion purpose only and is not a binding recommendation.  The patient was not physically examined nor present for their treatment options.  Therefore, final treatment plans cannot be decided.

## 2019-08-10 ENCOUNTER — Inpatient Hospital Stay (HOSPITAL_BASED_OUTPATIENT_CLINIC_OR_DEPARTMENT_OTHER): Payer: Medicare Other | Admitting: Nurse Practitioner

## 2019-08-10 ENCOUNTER — Telehealth: Payer: Self-pay

## 2019-08-10 ENCOUNTER — Inpatient Hospital Stay: Payer: Medicare Other | Attending: Nurse Practitioner

## 2019-08-10 ENCOUNTER — Encounter: Payer: Self-pay | Admitting: Nurse Practitioner

## 2019-08-10 ENCOUNTER — Other Ambulatory Visit: Payer: Self-pay

## 2019-08-10 VITALS — BP 150/71 | HR 93 | Temp 97.1°F | Resp 16 | Wt 119.2 lb

## 2019-08-10 DIAGNOSIS — Z85818 Personal history of malignant neoplasm of other sites of lip, oral cavity, and pharynx: Secondary | ICD-10-CM | POA: Insufficient documentation

## 2019-08-10 DIAGNOSIS — I10 Essential (primary) hypertension: Secondary | ICD-10-CM | POA: Insufficient documentation

## 2019-08-10 DIAGNOSIS — Z85038 Personal history of other malignant neoplasm of large intestine: Secondary | ICD-10-CM | POA: Insufficient documentation

## 2019-08-10 DIAGNOSIS — C184 Malignant neoplasm of transverse colon: Secondary | ICD-10-CM

## 2019-08-10 DIAGNOSIS — E119 Type 2 diabetes mellitus without complications: Secondary | ICD-10-CM | POA: Insufficient documentation

## 2019-08-10 DIAGNOSIS — Z923 Personal history of irradiation: Secondary | ICD-10-CM | POA: Diagnosis not present

## 2019-08-10 DIAGNOSIS — Z9049 Acquired absence of other specified parts of digestive tract: Secondary | ICD-10-CM | POA: Insufficient documentation

## 2019-08-10 LAB — CEA (IN HOUSE-CHCC): CEA (CHCC-In House): 1.5 ng/mL (ref 0.00–5.00)

## 2019-08-10 NOTE — Progress Notes (Addendum)
  [facility] OFFICE PROGRESS NOTE   Diagnosis: Head and neck cancer, colon cancer  INTERVAL HISTORY:   Courtney Bowen returns as scheduled.  She feels Bowen.  No change in bowel habits.  No rectal bleeding.  She has a good appetite.  No nausea or vomiting.  No fever, cough, shortness of breath.  Objective:  Vital signs in last 24 hours:  Blood pressure (!) 150/71, pulse 93, temperature (!) 97.1 F (36.2 C), temperature source Temporal, resp. rate 16, weight 119 lb 3.2 oz (54.1 kg), SpO2 100 %.    HEENT: Neck without mass. Lymphatics: No palpable cervical, supraclavicular, axillary or inguinal lymph nodes. Resp: Lungs clear bilaterally. Cardio: Regular rate and rhythm. GI: Abdomen soft and nontender.  No hepatosplenomegaly. Vascular: No leg edema.   Lab Results:  Lab Results  Component Value Date   WBC 4.1 07/17/2019   HGB 11.8 (L) 07/17/2019   HCT 35.1 (L) 07/17/2019   MCV 74.8 (L) 07/17/2019   PLT 234 07/17/2019   NEUTROABS 2.9 02/28/2013    Imaging:  No results found.  Medications: I have reviewed the patient's current medications.  Assessment/Plan: 1. 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 MSI-high, loss ofMLH1 and PMS2 ? MLH27mthylation detected ? [facility]negative for recurrent colon cancer ? Surveillance colonoscopy 10/04/2017-patent end-to-side ileocolonic anastomosis; diverticulosis in the sigmoid colon; examination otherwise normal.  Next colonoscopy 3 years  2.Right tonsil cancer (T2N0), status post primary radiation completed 09/27/2012  3.Diabetes  4.Hypertension  5.History of tobacco use  6.History of lung nodules-stable CT chest 07/16/2017  CT chest 08/19/2018-increased left lingular nodule, other areas of scarring/nodularity are stable  PET scan 08/31/2018- lingular lesion on recent chest CT not  hypermetabolic but is at the lower limits of PET sensitivity based on size.  Faintly accentuated activity in a left upper lobe groundglass opacity maximum SUV 1.5.  Left parotid gland somewhat larger than the right and has asymmetrically accentuated activity, no discrete mass in the parotid gland.  CT chest 02/14/2019-continued interval growth of irregular shaped bilobed left upper lobe pulmonary nodule  PET scan 03/03/2019-stable lingular nodule and left upper lobe groundglass  CT super D chest 07/13/2019-slight interval enlargement left upper lobe pulmonary nodule, more solid-appearing.  Stable appearance right upper lobe pulmonary nodule.  07/19/2019 navigation bronchoscopy with brushings and biopsy of left upper lobe lung nodule/placement of fiducial markers-scant lung tissue with atypia.  No overt malignancy.  07/27/2019 office note Courtney Bowen surgical resection, plan for follow-up CT scan in 4 months  7.Changes of COPD on chest CT    Disposition: Courtney Bowen from colon cancer and head and neck cancer.  We will follow-up on the CEA from today.  Next colonoscopy due June 2022.  She will continue follow-up with Dr. GServando Snareregarding the lung nodules.  She will return for a CEA and follow-up visit in 6 months.  Patient seen with Courtney Bowen    Courtney Bowen   08/10/2019  12:40 Bowen  This was a shared visit with Courtney Bowen  Courtney Bowen.  The navigation bronchoscopy was nondiagnostic.  She will continue follow-up with Courtney Bowen evaluation of the lung nodules.  Courtney Bowen

## 2019-08-10 NOTE — Telephone Encounter (Signed)
TC to Pt. Per Leander Rams NP to inform Pt that her next colonoscopy will be June of 2022 Per [facility]. Pt. Verbalized understanding, No further problems or concerns noted.

## 2019-08-14 ENCOUNTER — Telehealth: Payer: Self-pay | Admitting: *Deleted

## 2019-08-14 NOTE — Telephone Encounter (Signed)
Notified of norma CEA. F/U as scheduled.

## 2019-08-15 ENCOUNTER — Telehealth: Payer: Self-pay | Admitting: Oncology

## 2019-08-15 NOTE — Telephone Encounter (Signed)
Scheduled per los. Called and spoke with patient. Confirmed appt 

## 2019-09-05 DIAGNOSIS — H2513 Age-related nuclear cataract, bilateral: Secondary | ICD-10-CM | POA: Diagnosis not present

## 2019-09-05 DIAGNOSIS — H40013 Open angle with borderline findings, low risk, bilateral: Secondary | ICD-10-CM | POA: Diagnosis not present

## 2019-09-05 DIAGNOSIS — H524 Presbyopia: Secondary | ICD-10-CM | POA: Diagnosis not present

## 2019-09-05 DIAGNOSIS — H04123 Dry eye syndrome of bilateral lacrimal glands: Secondary | ICD-10-CM | POA: Diagnosis not present

## 2019-10-02 DIAGNOSIS — H2513 Age-related nuclear cataract, bilateral: Secondary | ICD-10-CM | POA: Diagnosis not present

## 2019-10-02 DIAGNOSIS — H40013 Open angle with borderline findings, low risk, bilateral: Secondary | ICD-10-CM | POA: Diagnosis not present

## 2019-10-05 ENCOUNTER — Other Ambulatory Visit: Payer: Self-pay | Admitting: Cardiothoracic Surgery

## 2019-10-05 DIAGNOSIS — D381 Neoplasm of uncertain behavior of trachea, bronchus and lung: Secondary | ICD-10-CM

## 2019-10-05 DIAGNOSIS — R911 Solitary pulmonary nodule: Secondary | ICD-10-CM

## 2019-11-23 ENCOUNTER — Other Ambulatory Visit: Payer: Medicare Other

## 2019-11-23 ENCOUNTER — Ambulatory Visit (INDEPENDENT_AMBULATORY_CARE_PROVIDER_SITE_OTHER): Payer: Medicare Other | Admitting: Cardiothoracic Surgery

## 2019-11-23 ENCOUNTER — Other Ambulatory Visit: Payer: Self-pay

## 2019-11-23 ENCOUNTER — Ambulatory Visit
Admission: RE | Admit: 2019-11-23 | Discharge: 2019-11-23 | Disposition: A | Discharge status: Home / Self Care | Payer: Medicare Other | Source: Ambulatory Visit | Attending: Cardiothoracic Surgery | Admitting: Cardiothoracic Surgery

## 2019-11-23 VITALS — Ht 66.0 in

## 2019-11-23 DIAGNOSIS — D381 Neoplasm of uncertain behavior of trachea, bronchus and lung: Secondary | ICD-10-CM

## 2019-11-23 DIAGNOSIS — Z85819 Personal history of malignant neoplasm of unspecified site of lip, oral cavity, and pharynx: Secondary | ICD-10-CM | POA: Diagnosis not present

## 2019-11-23 DIAGNOSIS — R911 Solitary pulmonary nodule: Secondary | ICD-10-CM

## 2019-11-23 DIAGNOSIS — I2510 Atherosclerotic heart disease of native coronary artery without angina pectoris: Secondary | ICD-10-CM | POA: Diagnosis not present

## 2019-11-23 DIAGNOSIS — J984 Other disorders of lung: Secondary | ICD-10-CM | POA: Diagnosis not present

## 2019-11-23 DIAGNOSIS — I700 Atherosclerosis of aorta: Secondary | ICD-10-CM | POA: Diagnosis not present

## 2019-11-23 DIAGNOSIS — R918 Other nonspecific abnormal finding of lung field: Secondary | ICD-10-CM

## 2019-11-23 DIAGNOSIS — J432 Centrilobular emphysema: Secondary | ICD-10-CM | POA: Diagnosis not present

## 2019-11-23 NOTE — Progress Notes (Signed)
[address]       [place] [address]             [phone]      Miranda B Slee Bier Medical Record #[id number] Date of Birth: 1945-10-24  Referring: Ladell Pier, MD Primary Care: Lajean Manes, MD Primary Cardiologist: No primary care provider on file.   Chief Complaint:   POST OP FOLLOW UP OPERATIVE REPORT DATE OF PROCEDURE:  07/19/2019 PREOPERATIVE DIAGNOSIS:  Slowly enlarging left upper lobe nodule with history of colon and tonsillar cancer in the past. 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 SURGICAL PROCEDURE:  Navigation bronchoscopy with brushings and biopsy of left upper lobe lung nodule and placement of fiducial markers. Clinical History: lung nodule (cm)    FINAL MICROSCOPIC DIAGNOSIS:   A. LUNG, LEFT UPPER LOBE, BIOPSY:  - Scant lung tissue with atypia  - No overt malignancy  - See comment  COMMENT:  Immunohistochemistry (TTF-1, CDX2) was performed to exclude a small  infiltrative cell population; there is no evidence in the submitted  material. Dr. Vic Ripper reviewed the case and agrees with the above  diagnosis.  GROSS DESCRIPTION:  Received in saline is a 0.51 0.1 x 0.1 cm aggregate of pink-red soft  tissue fragments, submitted in 1 cassette. (AK 07/19/2019)   Final Diagnosis performed by Thressa Sheller, MD.   History of Present Illness:     Patient returns to the office today with a follow-up CT scan.  He has known history of rectal cancer and tonsillar cancer.  She previously went underwent neck radiation by Dr. Pablo Ledger for her tonsillar cancer.  She had been adamant in the past about not considering surgical resection of a lung nodule.  In March attempted biopsy and placement of fiducial markers was done.  The pathologic material at that time was inconclusive.  After discussion at the multidisciplinary thoracic oncology conference it  was decided not to proceed with treatment in March but to obtain a follow-up CT scan at this time.     Past Medical History:  Diagnosis Date  . Cancer ([place]) 07/08/12   right tonsil  . Cataract    B/L  . Colon cancer ([place]) dx'd 2016  . COPD (chronic obstructive pulmonary disease) ([place])   . Diabetes mellitus without complication ([place])   . GERD (gastroesophageal reflux disease)   . Gout   . Hypercholesterolemia   . Hypertension    not on any meds  . Lung nodule   . Pneumonia   . Renal insufficiency    history of  . 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 Status post radiation therapy within last four weeks 08/17/11-10/07/12   squamous cell of tonsil  . Submandibular sialolithiasis    right  . Wears glasses      Social History   Tobacco Use  Smoking Status Former Smoker  . Packs/day: 1.50  . Years: 45.00  . Pack years: 67.50  . Types: Cigarettes  . Quit date: 07/07/2012  . Years since quitting: 7.3  Smokeless Tobacco Never Used  Tobacco Comment   1-1.5 ppd    Social History   Substance and Sexual Activity  Alcohol Use No   Comment: socially yrs ago     Allergies  Allergen Reactions  . Amoxicillin Nausea And Vomiting    It makes her very sick Did it involve swelling  of the face/tongue/throat, SOB, or low BP? No Did it involve sudden or severe rash/hives, skin peeling, or any reaction on the inside of your mouth or nose? No Did you need to seek medical attention at a hospital or doctor's office? Unknown When did it last happen?20 years ago If all above answers are "NO", may proceed with cephalosporin use.   . Erythromycin Nausea And Vomiting    Current Outpatient Medications  Medication Sig Dispense Refill  . alendronate (FOSAMAX) 70 MG tablet Take 70 mg by mouth once a week.    Marland Kitchen amLODipine (NORVASC) 2.5 MG tablet Take 2.5 mg by mouth every evening.     Marland Kitchen aspirin EC 81 MG tablet Take 81 mg by mouth daily.     Marland Kitchen atorvastatin  (LIPITOR) 10 MG tablet Take 20 mg by mouth daily.     . cholecalciferol (VITAMIN D) 400 UNITS TABS Take 400 Units by mouth daily.     Marland Kitchen docusate sodium (COLACE) 100 MG capsule Take 100 mg by mouth in the morning and at bedtime.    Marland Kitchen losartan (COZAAR) 50 MG tablet Take 50 mg by mouth every evening    . nicotine polacrilex (NICORETTE) 4 MG gum Take 4 mg by mouth as needed for smoking cessation.     Marland Kitchen omeprazole (PRILOSEC) 10 MG capsule Take 10 mg by mouth daily.    . polyethylene glycol (MIRALAX / GLYCOLAX) packet Take 17 g by mouth daily as needed.      No current facility-administered medications for this visit.       Physical Exam: Ht 5\' 6"  (1.676 m)   BMI 19.24 kg/m   General appearance: alert Head: Normocephalic, without obvious abnormality, atraumatic Neck: no adenopathy, no carotid bruit, no JVD, supple, symmetrical, trachea midline, thyroid not enlarged, symmetric, no tenderness/mass/nodules and right nesck is firm from previous radiation Lymph nodes: Cervical, supraclavicular, and axillary nodes normal. Resp: clear to auscultation bilaterally Cardio: regular rate and rhythm, S1, S2 normal, no murmur, click, rub or gallop GI: soft, non-tender; bowel sounds normal; no masses,  no organomegaly Extremities: extremities normal, atraumatic, no cyanosis or edema Neurologic: Grossly normal  Diagnostic Studies & Laboratory data:     Recent Radiology Findings:    CT CHEST WO CONTRAST  Result Date: 11/23/2019 CLINICAL DATA:  Follow-up left upper lobe pulmonary nodule, bronchoscopic biopsy revealing atypia, history of squamous cell right tonsillar cancer, colon cancer, former smoker EXAM: CT CHEST WITHOUT CONTRAST TECHNIQUE: Multidetector CT imaging of the chest was performed following the standard protocol without IV contrast. COMPARISON:  07/13/2019, 02/14/2019, 08/19/2018 FINDINGS: Cardiovascular: Aortic atherosclerosis. Normal heart size. Left coronary artery calcifications. No  pericardial effusion. Mediastinum/Nodes: Numerous prominent subcentimeter mediastinal and hilar lymph nodes. Thyroid gland, trachea, and esophagus demonstrate no significant findings. Lungs/Pleura: Moderate centrilobular emphysema. Bandlike scarring of the left lung base. Interval placement of fiducial markers about an irregular nodule of the left upper lobe. The dominant component of this nodule is not significantly changed compared to prior examination, measuring 1.2 x 1.0 cm (series 8, image 89). Multiple additional bilateral irregular opacities and small nodules are unchanged compared to prior examinations and presumably benign, including a 0.9 cm nodule of the right pulmonary apex (series 8, image 25). No pleural effusion or pneumothorax. Upper Abdomen: No acute abnormality. Calcific stigmata of chronic pancreatitis in the included upper abdomen. Musculoskeletal: No chest wall mass or suspicious bone lesions identified. IMPRESSION: 1. Interval placement of fiducial markers about an irregular nodule of the left upper lobe,  in keeping with recent bronchoscopic biopsy. The dominant component of this nodule is not significantly changed compared to prior examination, measuring 1.2 x 1.0 cm. 2. Multiple additional bilateral irregular opacities and small nodules are unchanged compared to prior examinations and presumably benign, including a 0.9 cm nodule of the right pulmonary apex. Attention on follow-up. 3. Emphysema (ICD10-J43.9). 4. Coronary artery disease. Aortic Atherosclerosis (ICD10-I70.0). Electronically Signed   By: Eddie Candle M.D.   On: 11/23/2019 16:28  I have independently reviewed the above radiology studies  and reviewed the findings with the patient. On my review the mass in question has not enlarged significantly but does look like has increased in density/fullness  Recent Lab Findings: Lab Results  Component Value Date   WBC 4.1 07/17/2019   HGB 11.8 (L) 07/17/2019   HCT 35.1 (L)  07/17/2019   PLT 234 07/17/2019   GLUCOSE 112 (H) 07/17/2019   ALT 14 07/17/2019   AST 21 07/17/2019   NA 129 (L) 07/17/2019   K 4.3 07/17/2019   CL 95 (L) 07/17/2019   CREATININE 0.96 07/17/2019   BUN 7 (L) 07/17/2019   CO2 23 07/17/2019   TSH 2.574 10/04/2013   INR 1.0 07/17/2019   HGBA1C 6.8 (H) 07/17/2019      Assessment / Plan:   Patient comes in today to discuss the findings of her recent CT scan of chest . Previous  bronchoscopy- path findings radiographic findings were reviewed at the multidisciplinary thoracic oncology conference march 2021.  Particularly with radiation oncologyas a  opinion.  It is noted the patient has repeatedly refused to consider surgical resection.  Atthat  point the consensus was to obtain a follow-up CT scan in 4 months.  Although the radiologist interpretation of the current scan would suggest there has been no change in the left lung lesion, on my review the lesion in question appears to have become more dense and slightly fuller than previous.  I discussed this with the patient and will have her seen by radiation therapy to consider stereotactic radiotherapy to the lesion although we have no definitive tissue diagnosis.   Medication Changes: No orders of the defined types were placed in this encounter.     [name] MD      [address] [place] [address] Office [phone]     11/27/2019 6:59 AM

## 2019-11-27 ENCOUNTER — Encounter: Payer: Self-pay | Admitting: Cardiothoracic Surgery

## 2019-12-04 NOTE — Progress Notes (Signed)
Thoracic Location of Tumor / Histology: LUL lung  Patient presented for surveillance scans.  Lung nodules have been monitored since February 2015.  CT scan then showed 8 x 11 mm spiculated right upper lobe lung nodule.  CT scan 01/2019 showed continued interval growth of irregular shaped bilobed left upper lobe pulmonary nodule, the right upper lobe lung nodule has remained stable.  CT Chest 11/23/2019: Interval placement of fiducial markers about an irregular nodule of the left upper lobe, in keeping with recent bronchoscopic biopsy. The dominant component of this nodule is not significantly changed compared to prior examination, measuring 1.2 x 1.0 cm.  Multiple additional bilateral irregular opacities and small nodules are unchanged compared to prior examinations and presumably benign, including a 0.9 cm nodule of the right pulmonary apex.  CT Chest 07/13/2019: Slight interval enlargement of left upper lobe pulmonary nodule, now measuring 12 x 8 mm, previously 11 x 5 mm. This is more solid appearing than on the study of July 16, 2017 and has enlarged in mean diameter since the study of April of 2020. Given persistence this remains concerning for neoplasm. Given low level uptake on PET indolent process is favored.  Biopsies of LUL Lung 07/19/2019   Tobacco/Marijuana/Snuff/ETOH use: Former Smoker  Past/Anticipated interventions by cardiothoracic surgery, if any:  Dr. Servando Snare 11/23/2019 - In March attempted biopsy and placement of fiducial markers was done.  The pathologic material at that time was inconclusive.   It is noted the patient has repeatedly refused to consider surgical resection.  After discussion at the multidisciplinary thoracic oncology conference it was decided not to proceed with treatment in March but to obtain a follow-up CT scan at this time. -Although the radiologist interpretation of the current scan would suggest there has been no change in the left lung lesion, on my review  the lesion in question appears to have become more dense and slightly fuller than previous.  I discussed this with the patient and will have her seen by radiation therapy to consider stereotactic radiotherapy to the lesion although we have no definitive tissue diagnosis.   Past/Anticipated interventions by medical oncology, if any:  Follow-up with Dr. Benay Spice 02/12/2020   Signs/Symptoms  Weight changes, if any:  Fluctuates due to previous tonsillar cancer.  She is unable to swallow tougher foods.  Respiratory complaints, if any: No  Hemoptysis, if any: no  Pain issues, if any:  No  SAFETY ISSUES:  Prior radiation? Neck Radiation for tonsillar cancer with Dr. Pablo Ledger 4/22-09/27/2012, 66 Gy in 33 fractions   Pacemaker/ICD? No  Possible current pregnancy? postmenopausal  Is the patient on methotrexate? No  Current Complaints / other details:   -History of colon and tonsillar cancer

## 2019-12-05 ENCOUNTER — Ambulatory Visit
Admission: RE | Admit: 2019-12-05 | Discharge: 2019-12-05 | Disposition: A | Discharge status: Home / Self Care | Payer: Medicare Other | Source: Ambulatory Visit | Attending: Radiation Oncology | Admitting: Radiation Oncology

## 2019-12-05 ENCOUNTER — Other Ambulatory Visit: Payer: Self-pay

## 2019-12-05 ENCOUNTER — Encounter: Payer: Self-pay | Admitting: Radiation Oncology

## 2019-12-05 VITALS — BP 134/77 | HR 94 | Temp 98.2°F | Resp 18 | Ht 65.0 in | Wt 115.6 lb

## 2019-12-05 DIAGNOSIS — Z87891 Personal history of nicotine dependence: Secondary | ICD-10-CM | POA: Diagnosis not present

## 2019-12-05 DIAGNOSIS — C182 Malignant neoplasm of ascending colon: Secondary | ICD-10-CM

## 2019-12-05 DIAGNOSIS — Z85818 Personal history of malignant neoplasm of other sites of lip, oral cavity, and pharynx: Secondary | ICD-10-CM | POA: Diagnosis not present

## 2019-12-05 DIAGNOSIS — R911 Solitary pulmonary nodule: Secondary | ICD-10-CM

## 2019-12-05 DIAGNOSIS — R918 Other nonspecific abnormal finding of lung field: Secondary | ICD-10-CM | POA: Insufficient documentation

## 2019-12-05 DIAGNOSIS — Z85038 Personal history of other malignant neoplasm of large intestine: Secondary | ICD-10-CM | POA: Insufficient documentation

## 2019-12-05 DIAGNOSIS — Z85819 Personal history of malignant neoplasm of unspecified site of lip, oral cavity, and pharynx: Secondary | ICD-10-CM | POA: Insufficient documentation

## 2019-12-05 DIAGNOSIS — C099 Malignant neoplasm of tonsil, unspecified: Secondary | ICD-10-CM

## 2019-12-05 DIAGNOSIS — Z923 Personal history of irradiation: Secondary | ICD-10-CM | POA: Diagnosis not present

## 2019-12-05 NOTE — Progress Notes (Addendum)
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 Diagnoses of Cancer of ascending colon ([place]) and Lung nodule were also pertinent to this visit.   HISTORY OF PRESENT ILLNESS: Courtney Bowen is a 74 y.o. female seen at the request of Dr.  Servando Snare for an enlarging left upper lobe nodule.  The patient has a history of a right localized tonsillar cancer that was treated with radiotherapy under the care of Dr. Pablo Ledger in 2014.  She has been followed without recurrence by Dr. Redmond Baseman.  She also was diagnosed with a T3N0 colon cancer for which she underwent surgical resection and has also been without disease and is followed by Dr. Benay Spice.  She is due for her next colonoscopy in 2022.  She has been followed since 2015 for nodules in the lung. In October 2020, there was continued interval growth in a bilobed left upper lobe nodule, it measured 14 x 11 x 8 mm, and prior to this in May 2020 was 10 x 5 x 7 mm.  A PET scan in November 2020 showed low-level activity with an SUV max of 1.1.  She was followed with repeat imaging and underwent biopsy in March 2021, this revealed scant tissue with atypia but no frank malignancy.  She has been followed since, and a CT chest without contrast on 11/23/2019 now measures the area at 1.2 x 1 cm however on personal review, Dr. Servando Snare was more concerned about fullness and more density in the imaging, and hence he has recommended that she come to discuss options of treatment.  She has previously refused surgery, and rather is coming today to discuss stereotactic body radiotherapy (SBRT).    PREVIOUS RADIATION THERAPY: Yes   08/16/2012-09/27/2012:  The right tonsil target  was treated to 66 Gy in 33 fractions under the care of Dr. 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[place]) 07/08/12   right   Status post radiation therapy within last four weeks 08/17/11-10/07/12   squamous cell of tonsil   Submandibular sialolithiasis    right   Wears glasses        PAST SURGICAL HISTORY: Past Surgical History:  Procedure Laterality Date   BREAST BIOPSY     hx of   CHOLECYSTECTOMY  20 yrs ago   laproscopic   COLONOSCOPY     FUDUCIAL PLACEMENT N/A 07/19/2019   Procedure: PLACEMENT OF FUDUCIAL;  Surgeon: [name], MD;  Location: [place];  Service: Thoracic;  Laterality: N/A;   LUNG BIOPSY Left 07/19/2019   Procedure: LUNG BIOPSY;  Surgeon: [name], MD;  Location: [place];  Service: Thoracic;  Laterality: Left;   TONSILLECTOMY     VIDEO BRONCHOSCOPY WITH ENDOBRONCHIAL NAVIGATION N/A 07/19/2019   Procedure: VIDEO BRONCHOSCOPY WITH ENDOBRONCHIAL NAVIGATION with Biopsy, Fuducial placement;  Surgeon: [name], MD;  Location: [place];  Service: Thoracic;  Laterality: N/A;   WISDOM TOOTH EXTRACTION       FAMILY HISTORY:  Family History  Problem Relation Age of Onset   Stroke Mother    Stroke Father    Diabetes Brother      SOCIAL HISTORY:  reports that she quit smoking about 7 years ago. Her smoking use included cigarettes. She has a 67.50 pack-year smoking history. She has never used smokeless tobacco. She reports that she does not drink alcohol and does not use drugs.  The patient  is divorced and lives in [place].  ALLERGIES: Amoxicillin and Erythromycin   MEDICATIONS:  Current Outpatient Medications  Medication Sig Dispense Refill   alendronate (FOSAMAX) 70 MG tablet Take 70 mg by mouth once a week.     amLODipine (NORVASC) 2.5 MG tablet Take 2.5 mg by mouth every evening.      aspirin EC 81 MG tablet Take 81 mg by mouth daily.      atorvastatin (LIPITOR) 10 MG tablet Take 20 mg by mouth daily.      cholecalciferol (VITAMIN D) 400 UNITS TABS Take 400 Units by mouth daily.      docusate sodium (COLACE) 100 MG capsule Take 100 mg by mouth in the morning and at bedtime.     losartan (COZAAR) 50 MG tablet Take 50 mg by mouth every evening     nicotine polacrilex (NICORETTE) 4 MG gum Take 4 mg by mouth as needed for smoking cessation.      omeprazole (PRILOSEC) 10 MG capsule Take 10 mg by mouth daily.     polyethylene glycol (MIRALAX / GLYCOLAX) packet Take 17 g by mouth daily as needed.      No current facility-administered medications for this encounter.     REVIEW OF SYSTEMS: On review of systems, the patient reports that she is doing well overall. She does have continued sequela from her prior tonsillar treatment including dry mouth, and such difficulty forming food bolus that it is difficult to eat most dense meats or bread products. She denies any chest pain, shortness of breath, cough, fevers, chills, night sweats. She has been dealing with unintended weight changes ever since her tonsil treatment but is able to generally keep her weight stable with drinking shakes and smoothies. No other complaints are noted.    PHYSICAL EXAM:  Wt Readings from Last 3 Encounters:  12/05/19 115 lb 9.6 oz (52.4 kg)  08/10/19 119 lb 3.2 oz (54.1 kg)  07/27/19 121 lb 11.1 oz (55.2 kg)   Temp Readings from Last 3 Encounters:  12/05/19 98.2 F (36.8 C)  08/10/19 (!) 97.1 F (36.2 C) (Temporal)  07/27/19 (!) 97.3 F (36.3 C)   BP Readings from Last 3  Encounters:  12/05/19 134/77  08/10/19 (!) 150/71  07/27/19 134/73   Pulse Readings from Last 3 Encounters:  12/05/19 94  08/10/19 93  07/27/19 86   Pain Assessment Pain Score: 0-No pain/10  In general this is a well appearing African American female in no acute distress. She's alert and oriented x4 and appropriate throughout the examination. Cardiopulmonary assessment is negative for acute distress and she exhibits normal effort.    ECOG = 1  0 - Asymptomatic (Fully active, able to carry on  all predisease activities without restriction)  1 - Symptomatic but completely ambulatory (Restricted in physically strenuous activity but ambulatory and able to carry out work of a light or sedentary nature. For example, light housework, office work)  2 - Symptomatic, <50% in bed during the day (Ambulatory and capable of all self care but unable to carry out any work activities. Up and about more than 50% of waking hours)  3 - Symptomatic, >50% in bed, but not bedbound (Capable of only limited self-care, confined to bed or chair 50% or more of waking hours)  4 - Bedbound (Completely disabled. Cannot carry on any self-care. Totally confined to bed or chair)  5 - Death   Eustace Pen MM, Creech RH, Tormey DC, et al. [phone]). "Toxicity and response criteria of the [facility]". [place] Oncol. 5 (6): 649-55    LABORATORY DATA:  Lab Results  Component Value Date   WBC 4.1 07/17/2019   HGB 11.8 (L) 07/17/2019   HCT 35.1 (L) 07/17/2019   MCV 74.8 (L) 07/17/2019   PLT 234 07/17/2019   Lab Results  Component Value Date   NA 129 (L) 07/17/2019   K 4.3 07/17/2019   CL 95 (L) 07/17/2019   CO2 23 07/17/2019   Lab Results  Component Value Date   ALT 14 07/17/2019   AST 21 07/17/2019   ALKPHOS 63 07/17/2019   BILITOT 0.5 07/17/2019      RADIOGRAPHY: CT CHEST WO CONTRAST  Result Date: 11/23/2019 CLINICAL DATA:  Follow-up left upper lobe pulmonary nodule,  bronchoscopic biopsy revealing atypia, history of squamous cell right tonsillar cancer, colon cancer, former smoker EXAM: CT CHEST WITHOUT CONTRAST TECHNIQUE: Multidetector CT imaging of the chest was performed following the standard protocol without IV contrast. COMPARISON:  07/13/2019, 02/14/2019, 08/19/2018 FINDINGS: Cardiovascular: Aortic atherosclerosis. Normal heart size. Left coronary artery calcifications. No pericardial effusion. Mediastinum/Nodes: Numerous prominent subcentimeter mediastinal and hilar lymph nodes. Thyroid gland, trachea, and esophagus demonstrate no significant findings. Lungs/Pleura: Moderate centrilobular emphysema. Bandlike scarring of the left lung base. Interval placement of fiducial markers about an irregular nodule of the left upper lobe. The dominant component of this nodule is not significantly changed compared to prior examination, measuring 1.2 x 1.0 cm (series 8, image 89). Multiple additional bilateral irregular opacities and small nodules are unchanged compared to prior examinations and presumably benign, including a 0.9 cm nodule of the right pulmonary apex (series 8, image 25). No pleural effusion or pneumothorax. Upper Abdomen: No acute abnormality. Calcific stigmata of chronic pancreatitis in the included upper abdomen. Musculoskeletal: No chest wall mass or suspicious bone lesions identified. IMPRESSION: 1. Interval placement of fiducial markers about an irregular nodule of the left upper lobe, in keeping with recent bronchoscopic biopsy. The dominant component of this nodule is not significantly changed compared to prior examination, measuring 1.2 x 1.0 cm. 2. Multiple additional bilateral irregular opacities and small nodules are unchanged compared to prior examinations and presumably benign, including a 0.9 cm nodule of the right pulmonary apex. Attention on follow-up. 3. Emphysema (ICD10-J43.9). 4. Coronary artery disease. Aortic Atherosclerosis (ICD10-I70.0).  Electronically Signed   By: Eddie Candle M.D.   On: 11/23/2019 16:28       IMPRESSION/PLAN: 1. Putative Stage IA2, cT1bN0M0, NSCLC of the LUL. Dr. Lisbeth Renshaw discusses the pathology findings and reviews the nature of early stage lung disease. He reviews the limitations of not having tissue confirmation for this situation, but discusses that he would still approach a definitive treatment  given that the data points to persistent and progressive findings that would be most consistent with a malignant process. We discussed the risks, benefits, short, and long term effects of radiotherapy, and the patient is interested in proceeding. Dr. Lisbeth Renshaw discusses the delivery and logistics of radiotherapy and anticipates a course of 3-5 fractions of radiotherapy. Written consent is obtained and placed in the chart, a copy was provided to the patient. She will come back on 12/21/19 for simulation and begin treatment about a week afterwards. 2. History of Stage T3N0 Adenocarcinoma of the colon. The patient follows with Dr. Benay Spice, and will continue routine colonoscopy screening which is due again in 2022.  We will follow this expectantly. 3. History of stage cT2aN0, squamous cell carcinoma of the right tonsil.  She continues to follow-up with Dr. Redmond Baseman in surveillance.  This will also be followed expectantly  In a visit lasting 60 minutes, greater than 50% of the time was spent face to face discussing the patient's condition, in preparation for the discussion, and coordinating the patient's care.  The above documentation reflects my direct findings during this shared patient visit. Please see the separate note by Dr. Lisbeth Renshaw on this date for the remainder of the patient's plan of care.    Carola Rhine, PAC

## 2019-12-05 NOTE — Addendum Note (Signed)
Encounter addended by: Hayden Pedro, PA-C on: 12/05/2019 12:42 PM  Actions taken: Clinical Note Signed

## 2019-12-13 DIAGNOSIS — H25811 Combined forms of age-related cataract, right eye: Secondary | ICD-10-CM | POA: Diagnosis not present

## 2019-12-13 DIAGNOSIS — H2511 Age-related nuclear cataract, right eye: Secondary | ICD-10-CM | POA: Diagnosis not present

## 2019-12-21 ENCOUNTER — Other Ambulatory Visit: Payer: Self-pay

## 2019-12-21 ENCOUNTER — Ambulatory Visit
Admission: RE | Admit: 2019-12-21 | Discharge: 2019-12-21 | Disposition: A | Discharge status: Home / Self Care | Payer: Medicare Other | Source: Ambulatory Visit | Attending: Radiation Oncology | Admitting: Radiation Oncology

## 2019-12-21 DIAGNOSIS — Z85818 Personal history of malignant neoplasm of other sites of lip, oral cavity, and pharynx: Secondary | ICD-10-CM | POA: Diagnosis not present

## 2019-12-21 DIAGNOSIS — R918 Other nonspecific abnormal finding of lung field: Secondary | ICD-10-CM | POA: Diagnosis not present

## 2019-12-21 DIAGNOSIS — R911 Solitary pulmonary nodule: Secondary | ICD-10-CM | POA: Diagnosis not present

## 2020-01-03 DIAGNOSIS — R918 Other nonspecific abnormal finding of lung field: Secondary | ICD-10-CM | POA: Insufficient documentation

## 2020-01-03 DIAGNOSIS — Z85818 Personal history of malignant neoplasm of other sites of lip, oral cavity, and pharynx: Secondary | ICD-10-CM | POA: Insufficient documentation

## 2020-01-03 DIAGNOSIS — R911 Solitary pulmonary nodule: Secondary | ICD-10-CM | POA: Diagnosis not present

## 2020-01-04 ENCOUNTER — Ambulatory Visit
Admission: RE | Admit: 2020-01-04 | Discharge: 2020-01-04 | Disposition: A | Discharge status: Home / Self Care | Payer: Medicare Other | Source: Ambulatory Visit | Attending: Radiation Oncology | Admitting: Radiation Oncology

## 2020-01-04 ENCOUNTER — Other Ambulatory Visit: Payer: Self-pay

## 2020-01-04 DIAGNOSIS — Z85818 Personal history of malignant neoplasm of other sites of lip, oral cavity, and pharynx: Secondary | ICD-10-CM | POA: Diagnosis not present

## 2020-01-04 DIAGNOSIS — R918 Other nonspecific abnormal finding of lung field: Secondary | ICD-10-CM | POA: Diagnosis not present

## 2020-01-05 ENCOUNTER — Ambulatory Visit: Payer: Medicare Other | Admitting: Radiation Oncology

## 2020-01-08 ENCOUNTER — Ambulatory Visit: Payer: Medicare Other | Admitting: Radiation Oncology

## 2020-01-09 ENCOUNTER — Other Ambulatory Visit: Payer: Self-pay

## 2020-01-09 ENCOUNTER — Ambulatory Visit
Admission: RE | Admit: 2020-01-09 | Discharge: 2020-01-09 | Disposition: A | Discharge status: Home / Self Care | Payer: Medicare Other | Source: Ambulatory Visit | Attending: Radiation Oncology | Admitting: Radiation Oncology

## 2020-01-09 DIAGNOSIS — R918 Other nonspecific abnormal finding of lung field: Secondary | ICD-10-CM | POA: Diagnosis not present

## 2020-01-09 DIAGNOSIS — Z85818 Personal history of malignant neoplasm of other sites of lip, oral cavity, and pharynx: Secondary | ICD-10-CM | POA: Diagnosis not present

## 2020-01-11 ENCOUNTER — Other Ambulatory Visit: Payer: Self-pay

## 2020-01-11 ENCOUNTER — Encounter: Payer: Self-pay | Admitting: Radiation Oncology

## 2020-01-11 ENCOUNTER — Ambulatory Visit
Admission: RE | Admit: 2020-01-11 | Discharge: 2020-01-11 | Disposition: A | Discharge status: Home / Self Care | Payer: Medicare Other | Source: Ambulatory Visit | Attending: Radiation Oncology | Admitting: Radiation Oncology

## 2020-01-11 DIAGNOSIS — R911 Solitary pulmonary nodule: Secondary | ICD-10-CM | POA: Diagnosis not present

## 2020-01-11 DIAGNOSIS — R918 Other nonspecific abnormal finding of lung field: Secondary | ICD-10-CM | POA: Diagnosis not present

## 2020-01-11 DIAGNOSIS — Z85818 Personal history of malignant neoplasm of other sites of lip, oral cavity, and pharynx: Secondary | ICD-10-CM | POA: Diagnosis not present

## 2020-01-23 NOTE — Progress Notes (Signed)
  Radiation Oncology         (336) [phone] ________________________________  Name: Courtney Bowen MRN: [id number]  Date: 01/11/2020  DOB: 09-15-1945  End of Treatment Note  Diagnosis:   stage I non-small cell lung cancer of the left upper lobe    Indication for treatment::  curative       Radiation treatment dates:   01/04/20 - 01/11/20  Site/dose:   The patient was treated to the left lung with a course of stereotactic body radiation treatment.  The patient received 54 Gray in 3 fractions using a SBRT/ IMRT technique, with 3 fields.  Narrative: The patient tolerated radiation treatment relatively well.   No unexpected difficulties.  [name] breathing did not significantly change during the course of the treatment.  Plan: The patient has completed radiation treatment. The patient will return to radiation oncology clinic for routine followup in one month. I advised the patient to call or return sooner if they have any questions or concerns related to their recovery or treatment. ________________________________  Jodelle Gross, M.D., Ph.D.

## 2020-01-24 DIAGNOSIS — E441 Mild protein-calorie malnutrition: Secondary | ICD-10-CM | POA: Diagnosis not present

## 2020-01-24 DIAGNOSIS — R911 Solitary pulmonary nodule: Secondary | ICD-10-CM | POA: Diagnosis not present

## 2020-01-24 DIAGNOSIS — Z23 Encounter for immunization: Secondary | ICD-10-CM | POA: Diagnosis not present

## 2020-01-24 DIAGNOSIS — E7800 Pure hypercholesterolemia, unspecified: Secondary | ICD-10-CM | POA: Diagnosis not present

## 2020-01-24 DIAGNOSIS — I700 Atherosclerosis of aorta: Secondary | ICD-10-CM | POA: Diagnosis not present

## 2020-01-24 DIAGNOSIS — Z79899 Other long term (current) drug therapy: Secondary | ICD-10-CM | POA: Diagnosis not present

## 2020-01-24 DIAGNOSIS — I129 Hypertensive chronic kidney disease with stage 1 through stage 4 chronic kidney disease, or unspecified chronic kidney disease: Secondary | ICD-10-CM | POA: Diagnosis not present

## 2020-01-24 DIAGNOSIS — E1169 Type 2 diabetes mellitus with other specified complication: Secondary | ICD-10-CM | POA: Diagnosis not present

## 2020-01-27 DIAGNOSIS — Z23 Encounter for immunization: Secondary | ICD-10-CM | POA: Diagnosis not present

## 2020-02-06 ENCOUNTER — Telehealth: Payer: Self-pay | Admitting: Oncology

## 2020-02-06 NOTE — Telephone Encounter (Signed)
Rescheduled appointments per provider's On-Call schedule. Spoke to patient who is aware of updated appointment date and time.

## 2020-02-12 ENCOUNTER — Inpatient Hospital Stay: Payer: Medicare Other | Admitting: Oncology

## 2020-02-12 ENCOUNTER — Inpatient Hospital Stay: Payer: Medicare Other

## 2020-03-01 ENCOUNTER — Inpatient Hospital Stay: Payer: Medicare Other | Attending: Oncology

## 2020-03-01 ENCOUNTER — Inpatient Hospital Stay (HOSPITAL_BASED_OUTPATIENT_CLINIC_OR_DEPARTMENT_OTHER): Payer: Medicare Other | Admitting: Oncology

## 2020-03-01 ENCOUNTER — Other Ambulatory Visit: Payer: Self-pay

## 2020-03-01 VITALS — BP 147/86 | HR 93 | Temp 97.3°F | Resp 18 | Ht 65.0 in | Wt 113.9 lb

## 2020-03-01 DIAGNOSIS — C182 Malignant neoplasm of ascending colon: Secondary | ICD-10-CM | POA: Insufficient documentation

## 2020-03-01 DIAGNOSIS — Z85818 Personal history of malignant neoplasm of other sites of lip, oral cavity, and pharynx: Secondary | ICD-10-CM | POA: Insufficient documentation

## 2020-03-01 DIAGNOSIS — R918 Other nonspecific abnormal finding of lung field: Secondary | ICD-10-CM | POA: Diagnosis not present

## 2020-03-01 DIAGNOSIS — I10 Essential (primary) hypertension: Secondary | ICD-10-CM | POA: Diagnosis not present

## 2020-03-01 DIAGNOSIS — E119 Type 2 diabetes mellitus without complications: Secondary | ICD-10-CM | POA: Insufficient documentation

## 2020-03-01 DIAGNOSIS — Z923 Personal history of irradiation: Secondary | ICD-10-CM | POA: Insufficient documentation

## 2020-03-01 DIAGNOSIS — Z79899 Other long term (current) drug therapy: Secondary | ICD-10-CM | POA: Insufficient documentation

## 2020-03-01 DIAGNOSIS — Z9049 Acquired absence of other specified parts of digestive tract: Secondary | ICD-10-CM | POA: Diagnosis not present

## 2020-03-01 DIAGNOSIS — C184 Malignant neoplasm of transverse colon: Secondary | ICD-10-CM

## 2020-03-01 DIAGNOSIS — J449 Chronic obstructive pulmonary disease, unspecified: Secondary | ICD-10-CM | POA: Insufficient documentation

## 2020-03-01 LAB — CEA (IN HOUSE-CHCC): CEA (CHCC-In House): 1.06 ng/mL (ref 0.00–5.00)

## 2020-03-01 NOTE — Progress Notes (Signed)
Courtney Bowen   Diagnosis: Colon cancer, head neck cancer, lung nodules  INTERVAL HISTORY:   Courtney Bowen returns as scheduled.  She reports her food intake continues to be limited by dryness in the mouth. She completed SBRT to the left lung lesion in September.  She tolerated the treatment well. Objective:  Vital signs in last 24 hours:  Blood pressure (!) 147/86, pulse 93, temperature (!) 97.3 F (36.3 C), temperature source Tympanic, resp. rate (!) 100, height 5' 5"  (1.651 m), weight 113 lb 14.4 oz (51.7 kg).    HEENT: Oral cavity without visible mass, neck without mass Lymphatics: No cervical, supraclavicular, axillary, or inguinal nodes Resp: Lungs clear bilaterally Cardio: Regular rate and rhythm GI: No hepatosplenomegaly, no mass, nontender Vascular: No leg edema   Lab Results:  Lab Results  Component Value Date   WBC 4.1 07/17/2019   HGB 11.8 (L) 07/17/2019   HCT 35.1 (L) 07/17/2019   MCV 74.8 (L) 07/17/2019   PLT 234 07/17/2019   NEUTROABS 2.9 02/28/2013    CMP  Lab Results  Component Value Date   NA 129 (L) 07/17/2019   K 4.3 07/17/2019   CL 95 (L) 07/17/2019   CO2 23 07/17/2019   GLUCOSE 112 (H) 07/17/2019   BUN 7 (L) 07/17/2019   CREATININE 0.96 07/17/2019   CALCIUM 9.6 07/17/2019   PROT 7.9 07/17/2019   ALBUMIN 4.5 07/17/2019   AST 21 07/17/2019   ALT 14 07/17/2019   ALKPHOS 63 07/17/2019   BILITOT 0.5 07/17/2019   GFRNONAA 59 (L) 07/17/2019   GFRAA >60 07/17/2019    Lab Results  Component Value Date   CEA1 1.06 03/01/2020     Medications: I have reviewed the patient's current medications.   Assessment/Plan: 1. 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 MSI-high, loss ofMLH1 and PMS2 ? MLH36mthylation detected ? [facility] for [facility] cancer ? Surveillance colonoscopy  10/04/2017-patent end-to-side ileocolonic anastomosis; diverticulosis in the sigmoid colon; examination otherwise normal.  Next colonoscopy 3 years  2.Right tonsil cancer (T2N0), status post primary radiation completed 09/27/2012  3.Diabetes  4.Hypertension  5.History of tobacco use  6.History of lung nodules-stable CT chest 07/16/2017  CT chest 08/19/2018-increased left lingular nodule, other areas of scarring/nodularity are stable  PET scan 08/31/2018- lingular lesion on recent chest CT not hypermetabolic but is at the lower limits of PET sensitivity based on size.  Faintly accentuated activity in a left upper lobe groundglass opacity maximum SUV 1.5.  Left parotid gland somewhat larger than the right and has asymmetrically accentuated activity, no discrete mass in the parotid gland.  CT chest 02/14/2019-continued interval growth of irregular shaped bilobed left upper lobe pulmonary nodule  PET scan 03/03/2019-stable lingular nodule and left upper lobe groundglass  CT super D chest 07/13/2019-slight interval enlargement left upper lobe pulmonary nodule, more solid-appearing.  Stable appearance right upper lobe pulmonary nodule.  07/19/2019 navigation bronchoscopy with brushings and biopsy of left upper lobe lung nodule/placement of fiducial markers-scant lung tissue with atypia.  No overt malignancy.  07/27/2019 office Bowen Dr. GRuben Gottrondeclined  CT chest 11/23/2019-fiducial markers placed around the irregular left upper lobe nodule, nodule size not significantly changed, other nodules unchanged  SBRT to left upper lobe nodule in 3 fractions beginning 01/04/2020  7.Changes of COPD on chest CT     Disposition: Courtney Bowen in clinical remission from colon cancer  and head neck cancer.  She completed SBRT to a dominant left upper lung lesion in September.  She will continue surveillance imaging with Dr. Servando Snare.  Courtney Bowen will return for an office visit  in 6 months.  Betsy Coder, MD  03/01/2020  2:20 PM

## 2020-03-04 ENCOUNTER — Telehealth: Payer: Self-pay

## 2020-03-04 ENCOUNTER — Telehealth: Payer: Self-pay | Admitting: Oncology

## 2020-03-04 NOTE — Telephone Encounter (Signed)
Scheduled appointments per 11/5 los. Called patient, no answer. Left message for patient with appointment date and time.

## 2020-03-04 NOTE — Telephone Encounter (Signed)
Called no answer message left to call back to receive below mess  Benay Spice, Izola Price, MD  [place] [facility] Please call patient, CEA is normal, follow-up as scheduled

## 2020-03-04 NOTE — Telephone Encounter (Signed)
-----   Message from Ladell Pier, MD sent at 03/01/2020  4:01 PM EDT ----- Please call patient, CEA is normal, follow-up as scheduled

## 2020-04-15 DIAGNOSIS — Z1231 Encounter for screening mammogram for malignant neoplasm of breast: Secondary | ICD-10-CM | POA: Diagnosis not present

## 2020-04-16 ENCOUNTER — Telehealth: Payer: Self-pay | Admitting: *Deleted

## 2020-04-16 NOTE — Telephone Encounter (Signed)
Patient left VM asking when she is having her F/U CT scan. Thinks "someone has dropped the ball". Called her back and left message that according to notes from Dr. Benay Spice and Dr. Servando Snare, that Dr. Servando Snare will be doing her f/u imaging. Suggested she call that office to initiate this.

## 2020-04-30 ENCOUNTER — Other Ambulatory Visit: Payer: Self-pay

## 2020-04-30 DIAGNOSIS — R911 Solitary pulmonary nodule: Secondary | ICD-10-CM

## 2020-05-07 ENCOUNTER — Ambulatory Visit
Admission: RE | Admit: 2020-05-07 | Discharge: 2020-05-07 | Disposition: A | Discharge status: Home / Self Care | Payer: Medicare Other | Source: Ambulatory Visit | Attending: Cardiothoracic Surgery | Admitting: Cardiothoracic Surgery

## 2020-05-07 ENCOUNTER — Other Ambulatory Visit: Payer: Self-pay | Admitting: Radiation Oncology

## 2020-05-07 DIAGNOSIS — R911 Solitary pulmonary nodule: Secondary | ICD-10-CM

## 2020-05-07 DIAGNOSIS — C3412 Malignant neoplasm of upper lobe, left bronchus or lung: Secondary | ICD-10-CM

## 2020-05-08 ENCOUNTER — Telehealth: Payer: Self-pay | Admitting: *Deleted

## 2020-05-08 NOTE — Telephone Encounter (Signed)
CALLED PATIENT TO INFORM THAT Courtney Bowen WILL HAVE ME SCHEDULE A SCAN EARLY July AND FU APPT. THEREAFTER, ALSO INFORMED HER THAT THIS WOULD HAPPEN AFTER SHE SEES DR. Servando Snare ON 05-09-20, PATIENT VERIFIED UNDERSTANDING THIS

## 2020-05-09 ENCOUNTER — Ambulatory Visit (INDEPENDENT_AMBULATORY_CARE_PROVIDER_SITE_OTHER): Payer: Medicare Other | Admitting: Cardiothoracic Surgery

## 2020-05-09 ENCOUNTER — Other Ambulatory Visit: Payer: Self-pay

## 2020-05-09 VITALS — BP 119/73 | HR 60 | Temp 97.3°F | Resp 20 | Ht 65.0 in | Wt 123.0 lb

## 2020-05-09 DIAGNOSIS — R918 Other nonspecific abnormal finding of lung field: Secondary | ICD-10-CM

## 2020-05-09 NOTE — Progress Notes (Signed)
[address]       [place] [address]             [phone]      Courtney Bowen [place] Medical Record #[id number] Date of Birth: [birth year]  Referring: Ladell Pier, MD Primary Care: Lajean Manes, MD Primary Cardiologist: No primary care provider on file.   Chief Complaint:   POST OP FOLLOW UP OPERATIVE REPORT DATE OF PROCEDURE:  07/19/2019 PREOPERATIVE DIAGNOSIS:  Slowly enlarging left upper lobe nodule with history of colon and tonsillar cancer in the past. 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 SURGICAL PROCEDURE:  Navigation bronchoscopy with brushings and biopsy of left upper lobe lung nodule and placement of fiducial markers. Clinical History: lung nodule (cm)    FINAL MICROSCOPIC DIAGNOSIS:   A. LUNG, LEFT UPPER LOBE, BIOPSY:  - Scant lung tissue with atypia  - No overt malignancy  - See comment  COMMENT:  Immunohistochemistry (TTF-1, CDX2) was performed to exclude a small  infiltrative cell population; there is no evidence in the submitted  material. Dr. Vic Ripper reviewed the case and agrees with the above  diagnosis.  GROSS DESCRIPTION:  Received in saline is a 0.51 0.1 x 0.1 cm aggregate of pink-red soft  tissue fragments, submitted in 1 cassette. (AK 07/19/2019)   Final Diagnosis performed by Thressa Sheller, MD.   History of Present Illness:   Patient returns today with a follow-up CT scan she has known history of rectal cancer and tonsillar cancer treated previously.  She had neck radiation by Dr. Pablo Ledger for tonsillar cancer.  A lung nodule was noted, attempts at biopsy in March 2021 were inconclusive, fiducial markers were placed.  Patient had been adamant about not wishing to proceed with any surgical resection.    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 Lisbeth Renshaw   Past Medical History:  Diagnosis Date  . Cancer ([place]) 07/08/12   right tonsil  . Cataract    B/L  . Colon cancer ([place]) dx'd 2016  . COPD (chronic obstructive pulmonary disease) ([place])   . Diabetes mellitus without complication ([place])   . GERD (gastroesophageal reflux disease)   . Gout   . Hypercholesterolemia   . Hypertension    not on any meds  . Lung nodule   . Pneumonia   . Renal insufficiency    history of  . 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 Status post radiation therapy within last four weeks 08/17/11-10/07/12   squamous cell of tonsil  . Submandibular sialolithiasis    right  . Wears glasses      Social History   Tobacco Use  Smoking Status Former Smoker  . Packs/day: 1.50  . Years: 45.00  . Pack years: 67.50  . Types: Cigarettes  . Quit date: 07/07/2012  . Years since quitting: 7.8  Smokeless Tobacco Never Used  Tobacco Comment   1-1.5 ppd    Social History   Substance and Sexual Activity  Alcohol Use No   Comment: socially yrs ago     Allergies  Allergen Reactions  . Amoxicillin Nausea And Vomiting    It makes her very sick Did it involve swelling of the face/tongue/throat, SOB, or low BP? No Did it involve sudden or severe rash/hives,  skin peeling, or any reaction on the inside of your mouth or nose? No Did you need to seek medical attention at a hospital or doctor's office? Unknown When did it last happen?20 years ago If all above answers are "NO", may proceed with cephalosporin use.   . Erythromycin Nausea And Vomiting    Current Outpatient Medications  Medication Sig Dispense Refill  . alendronate (FOSAMAX) 70 MG tablet Take 70 mg by mouth once a week.    Marland Kitchen amLODipine (NORVASC) 2.5 MG tablet Take 2.5 mg by mouth every evening.     Marland Kitchen aspirin EC 81 MG tablet Take 81 mg by mouth daily.     Marland Kitchen atorvastatin (LIPITOR) 20 MG tablet Take 20 mg by mouth daily.    .  cholecalciferol (VITAMIN D) 400 UNITS TABS Take 400 Units by mouth daily.     Marland Kitchen docusate sodium (COLACE) 100 MG capsule Take 100 mg by mouth in the morning and at bedtime.    Marland Kitchen losartan (COZAAR) 50 MG tablet Take 50 mg by mouth every evening    . nicotine polacrilex (NICORETTE) 4 MG gum Take 4 mg by mouth as needed for smoking cessation.     Marland Kitchen omeprazole (PRILOSEC) 20 MG capsule Take 20 mg by mouth daily.    . polyethylene glycol (MIRALAX / GLYCOLAX) packet Take 17 g by mouth daily as needed.      No current facility-administered medications for this visit.       Physical Exam: BP 119/73   Pulse 60   Temp (!) 97.3 F (36.3 C) (Skin)   Resp 20   Ht 5\' 5"  (1.651 m)   Wt 123 lb (55.8 kg)   SpO2 91% Comment: RA with mask on  BMI 20.47 kg/m   General appearance: alert, cooperative, appears stated age and no distress Head: Normocephalic, without obvious abnormality, atraumatic Neck: no adenopathy, no carotid bruit, no JVD, supple, symmetrical, trachea midline, thyroid not enlarged, symmetric, no tenderness/mass/nodules and The patient has skin changes over the right neck from her previous neck radiation with tightening of the skin no definite masses Lymph nodes: Cervical, supraclavicular, and axillary nodes normal. Cardio: regular rate and rhythm, S1, S2 normal, no murmur, click, rub or gallop Extremities: extremities normal, atraumatic, no cyanosis or edema and Homans sign is negative, no sign of DVT Neurologic: Grossly normal   Diagnostic Studies & Laboratory data:     Recent Radiology Findings:    CT Chest Wo Contrast  Result Date: 05/07/2020 CLINICAL DATA:  Status post SBRT 2 LEFT lung lesion completed September 2021. EXAM: CT CHEST WITHOUT CONTRAST TECHNIQUE: Multidetector CT imaging of the chest was performed following the standard protocol without IV contrast. COMPARISON:  CT 11/23/2019 FINDINGS: Cardiovascular: Coronary artery calcification and aortic atherosclerotic  calcification. Mediastinum/Nodes: No axillary or supraclavicular adenopathy. No mediastinal or hilar adenopathy. No pericardial fluid. Esophagus normal. Lungs/Pleura: Targeted LEFT upper lobe pulmonary nodule measures 11 mm x 6 mm (image 84/8) compared to 12 mm x 10 mm. Adjacent fiducial markers. No new nodularity in LEFT upper lobe. Band of linear atelectasis in LEFT lower lobe. In the RIGHT lung, RIGHT apical nodule measuring 8 mm (image 20/8) compares to 9 mm. No new nodularity in the RIGHT lung. Scattered additional foci of architectural distortion in the RIGHT lung are stable. Extensive centrilobular emphysema the upper lobes. Upper Abdomen: Limited view of the liver, kidneys, pancreas are unremarkable. Normal adrenal glands. Chronic calcification the pancreas. Adrenal glands Musculoskeletal: No aggressive osseous lesion. IMPRESSION: 1.  Slight decrease in volume of targeted LEFT upper lobe pulmonary nodule. 2. No new nodularity in LEFT lung. 3. Stable nodule at the RIGHT lung apex. 4. Stable additional foci of architect distortion throughout the lungs. 5. Extensive centrilobular emphysema in the upper lobes. 6. No lymphadenopathy. 7. Aortic Atherosclerosis (ICD10-I70.0) and Emphysema (ICD10-J43.9). Electronically Signed   By: Suzy Bouchard M.D.   On: 05/07/2020 10:41   I have independently reviewed the above radiology studies  and reviewed the findings with the patient.   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  The patient is doing well 6 months after treatment, she will obtain a follow-up CT scan through the radiation oncology office in 6 months and continue follow-up with Dr. Lisbeth Renshaw and Dr. Benay Spice.   Medication Changes: No orders of the defined types were placed in this encounter.     [name] MD      [address] [place], [address] Office [phone]     05/09/2020 11:37 AM

## 2020-05-30 ENCOUNTER — Ambulatory Visit: Payer: Medicare Other | Admitting: Cardiothoracic Surgery

## 2020-07-22 DIAGNOSIS — Z961 Presence of intraocular lens: Secondary | ICD-10-CM | POA: Diagnosis not present

## 2020-07-22 DIAGNOSIS — H5212 Myopia, left eye: Secondary | ICD-10-CM | POA: Diagnosis not present

## 2020-07-22 DIAGNOSIS — H5201 Hypermetropia, right eye: Secondary | ICD-10-CM | POA: Diagnosis not present

## 2020-07-29 DIAGNOSIS — Z1159 Encounter for screening for other viral diseases: Secondary | ICD-10-CM | POA: Diagnosis not present

## 2020-07-29 DIAGNOSIS — Z0000 Encounter for general adult medical examination without abnormal findings: Secondary | ICD-10-CM | POA: Diagnosis not present

## 2020-07-29 DIAGNOSIS — Z1389 Encounter for screening for other disorder: Secondary | ICD-10-CM | POA: Diagnosis not present

## 2020-08-20 DIAGNOSIS — Z23 Encounter for immunization: Secondary | ICD-10-CM | POA: Diagnosis not present

## 2020-08-28 ENCOUNTER — Inpatient Hospital Stay (HOSPITAL_BASED_OUTPATIENT_CLINIC_OR_DEPARTMENT_OTHER): Payer: Medicare Other | Admitting: Internal Medicine

## 2020-08-28 ENCOUNTER — Inpatient Hospital Stay: Payer: Medicare Other | Attending: Internal Medicine

## 2020-08-28 ENCOUNTER — Other Ambulatory Visit: Payer: Self-pay

## 2020-08-28 ENCOUNTER — Encounter: Payer: Self-pay | Admitting: Internal Medicine

## 2020-08-28 VITALS — BP 135/77 | HR 94 | Temp 98.3°F | Resp 17 | Ht 65.0 in | Wt 109.6 lb

## 2020-08-28 DIAGNOSIS — Z923 Personal history of irradiation: Secondary | ICD-10-CM | POA: Insufficient documentation

## 2020-08-28 DIAGNOSIS — R911 Solitary pulmonary nodule: Secondary | ICD-10-CM

## 2020-08-28 DIAGNOSIS — J449 Chronic obstructive pulmonary disease, unspecified: Secondary | ICD-10-CM | POA: Insufficient documentation

## 2020-08-28 DIAGNOSIS — Z85818 Personal history of malignant neoplasm of other sites of lip, oral cavity, and pharynx: Secondary | ICD-10-CM | POA: Diagnosis not present

## 2020-08-28 DIAGNOSIS — C182 Malignant neoplasm of ascending colon: Secondary | ICD-10-CM | POA: Diagnosis not present

## 2020-08-28 DIAGNOSIS — Z79899 Other long term (current) drug therapy: Secondary | ICD-10-CM | POA: Diagnosis not present

## 2020-08-28 DIAGNOSIS — C184 Malignant neoplasm of transverse colon: Secondary | ICD-10-CM

## 2020-08-28 DIAGNOSIS — Z85038 Personal history of other malignant neoplasm of large intestine: Secondary | ICD-10-CM | POA: Diagnosis not present

## 2020-08-28 DIAGNOSIS — N289 Disorder of kidney and ureter, unspecified: Secondary | ICD-10-CM | POA: Diagnosis not present

## 2020-08-28 DIAGNOSIS — E119 Type 2 diabetes mellitus without complications: Secondary | ICD-10-CM | POA: Diagnosis not present

## 2020-08-28 DIAGNOSIS — C099 Malignant neoplasm of tonsil, unspecified: Secondary | ICD-10-CM

## 2020-08-28 DIAGNOSIS — Z9049 Acquired absence of other specified parts of digestive tract: Secondary | ICD-10-CM | POA: Insufficient documentation

## 2020-08-28 DIAGNOSIS — Z7982 Long term (current) use of aspirin: Secondary | ICD-10-CM | POA: Diagnosis not present

## 2020-08-28 DIAGNOSIS — C3412 Malignant neoplasm of upper lobe, left bronchus or lung: Secondary | ICD-10-CM | POA: Diagnosis not present

## 2020-08-28 LAB — BUN & CREATININE (CHCC)
BUN: 12 mg/dL (ref 8–23)
Creatinine: 1.16 mg/dL — ABNORMAL HIGH (ref 0.44–1.00)
GFR, Estimated: 50 mL/min — ABNORMAL LOW (ref >60)

## 2020-08-28 NOTE — Progress Notes (Signed)
[facility] Telephone:([phone]) [phone]   Fax:([phone]  OFFICE PROGRESS NOTE  Lajean Manes, MD [address] [place] [address]  DIAGNOSIS:  1. 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 MSI-high, loss ofMLH1 and PMS2 ? MLH42mthylation detected ? [facility] for recurrent colon cancer ? Surveillance colonoscopy 10/04/2017-patent end-to-side ileocolonic anastomosis; diverticulosis in the sigmoid colon; examination otherwise normal.  Next colonoscopy 3 years  2.Right tonsil cancer (T2N0), status post primary radiation completed 09/27/2012 3.  Presumed stage Ia non-small cell lung cancer with a nodule in the left upper lobe status post SBRT under the care of Dr. MLisbeth Renshaw  The biopsy showed atypia but no confirmed malignancy.  PRIOR THERAPY:  CURRENT THERAPY: Observation.   INTERVAL HISTORY: Courtney WIEDMAN75y.o. female returns to the clinic today for follow-up visit and to establish care with me.  She is a former patient of Dr. SBenay Spicewho [facility]  The patient is feeling fine today with no concerning complaints except for mild fatigue.  She denied having any current chest pain, shortness of breath, cough or hemoptysis.  She denied having any fever or chills.  She had no nausea, vomiting, diarrhea or constipation.  She denied having any headache or visual changes.  She has no weight loss or night sweats.   MEDICAL HISTORY: Past Medical History:  Diagnosis Date  . Cancer ([facility] 07/08[place]   right tonsil  . Cataract    B/L  . Colon cancer ([place] dx'd [place]  . [place] disease[place] ([place]   . Diabetes mellitus without complication ([facility]   . GERD (gastroesophageal reflux disease)   . Gout   . Hypercholesterolemia   .  Hypertension    not on any meds  . Lung nodule   . Pneumonia   . Renal insufficiency    history of  . Squamous cell carcinoma of tonsil ([place] 07/08/12   right  . Status post radiation therapy within last four weeks 08/17/11-10/07/12   squamous cell of tonsil  . Submandibular sialolithiasis    right  . Wears glasses     ALLERGIES:  is allergic to amoxicillin and erythromycin.  MEDICATIONS:  Current Outpatient Medications  Medication Sig Dispense Refill  . alendronate (FOSAMAX) 70 MG tablet Take 70 mg by mouth once a week.    .Marland KitchenamLODipine (NORVASC) 2.5 MG tablet Take 2.5 mg by mouth every evening.     .Marland Kitchenaspirin EC 81 MG tablet Take 81 mg by mouth daily.     .Marland Kitchenatorvastatin (LIPITOR) 20 MG tablet Take 20 mg by mouth daily.    . cholecalciferol (VITAMIN D) 400 UNITS TABS Take 400 Units by mouth daily.     .Marland Kitchendocusate sodium (COLACE) 100 MG capsule Take 100 mg by mouth in the morning and at bedtime.    .Marland Kitchenlosartan (COZAAR) 50 MG tablet Take 50 mg by mouth every evening    . nicotine polacrilex (NICORETTE) 4 MG gum Take 4 mg by mouth as needed for smoking cessation.     .Marland Kitchenomeprazole (PRILOSEC) 20 MG capsule Take 20 mg by mouth daily.    . polyethylene glycol (MIRALAX / GLYCOLAX) packet Take 17 g by mouth daily as needed.      No current facility-administered medications for this  visit.    SURGICAL HISTORY:  Past Surgical History:  Procedure Laterality Date  . BREAST BIOPSY     hx of  . CHOLECYSTECTOMY  20 yrs ago   laproscopic  . COLONOSCOPY    . FUDUCIAL PLACEMENT N/A 07/19/2019   Procedure: PLACEMENT OF FUDUCIAL;  Surgeon: [name], MD;  Location: [facility];  Service: Thoracic;  Laterality: N/A;  . LUNG BIOPSY Left 07/19/2019   Procedure: LUNG BIOPSY;  Surgeon: [name], MD;  Location: [facility];  Service: Thoracic;  Laterality: Left;  . TONSILLECTOMY    . VIDEO BRONCHOSCOPY WITH ENDOBRONCHIAL NAVIGATION N/A 07/19/2019   Procedure: VIDEO BRONCHOSCOPY WITH ENDOBRONCHIAL  NAVIGATION with Biopsy, Fuducial placement;  Surgeon: [name], MD;  Location: [facility];  Service: Thoracic;  Laterality: N/A;  . WISDOM TOOTH EXTRACTION      REVIEW OF SYSTEMS:  Constitutional: positive for fatigue Eyes: negative Ears, nose, mouth, throat, and face: negative Respiratory: negative Cardiovascular: negative Gastrointestinal: negative Genitourinary:negative Integument/breast: negative Hematologic/lymphatic: negative Musculoskeletal:negative Neurological: negative Behavioral/Psych: negative Endocrine: negative Allergic/Immunologic: negative   PHYSICAL EXAMINATION: General appearance: alert, cooperative, fatigued and no distress Head: Normocephalic, without obvious abnormality, atraumatic Neck: no adenopathy, no JVD, supple, symmetrical, trachea midline and thyroid not enlarged, symmetric, no tenderness/mass/nodules Lymph nodes: Cervical, supraclavicular, and axillary nodes normal. Resp: clear to auscultation bilaterally Back: symmetric, no curvature. ROM normal. No CVA tenderness. Cardio: regular rate and rhythm, S1, S2 normal, no murmur, click, rub or gallop GI: soft, non-tender; bowel sounds normal; no masses,  no organomegaly Extremities: extremities normal, atraumatic, no cyanosis or edema Neurologic: Alert and oriented X 3, normal strength and tone. Normal symmetric reflexes. Normal coordination and gait  ECOG PERFORMANCE STATUS: 1 - Symptomatic but completely ambulatory  Blood pressure 135/77, pulse 94, temperature 98.3 F (36.8 C), temperature source Tympanic, resp. rate 17, height _0  (1.651 m), weight 109 lb 9.6 oz (49.7 kg), SpO2 100 %.  LABORATORY DATA: Lab Results  Component Value Date   WBC 4.1 07/17/2019   HGB 11.8 (L) 07/17/2019   HCT 35.1 (L) 07/17/2019   MCV 74.8 (L) 07/17/2019   PLT 234 07/17/2019      Chemistry      Component Value Date/Time   NA 129 (L) 07/17/2019 1411   K 4.3 07/17/2019 1411   CL 95 (L) 07/17/2019 1411    CO2 23 07/17/2019 1411   BUN 7 (L) 07/17/2019 1411   BUN 5.4 (L) 11/09/2012 1033   CREATININE 0.96 07/17/2019 1411   CREATININE 0.8 11/09/2012 1033      Component Value Date/Time   CALCIUM 9.6 07/17/2019 1411   CALCIUM 6.9 (L) 09/24/2010 0500   ALKPHOS 63 07/17/2019 1411   AST 21 07/17/2019 1411   ALT 14 07/17/2019 1411   BILITOT 0.5 07/17/2019 1411       RADIOGRAPHIC STUDIES: No results found.  ASSESSMENT AND PLAN: This is a very pleasant 75 years old African-American female with the following medical problems. 2. 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 MSI-high, loss ofMLH1 and PMS2 ? MLH19mthylation detected ? [facility] ? Surveillance colonoscopy 10/04/2017-patent end-to-side ileocolonic anastomosis; diverticulosis in the sigmoid colon; examination otherwise normal.  Next colonoscopy 3 years  2.Right tonsil cancer (T2N0), status post primary radiation completed 09/27/2012 3.  Presumed stage Ia non-small cell lung cancer with a nodule in the left upper lobe status post SBRT  under the care of Dr. Lisbeth Renshaw.  The biopsy showed atypia but no confirmed malignancy. The patient is currently on observation and she is feeling fine today with no concerning complaints except for mild fatigue. I reviewed her record and previous scans and recommended for the patient to continue on observation with repeat CT scan of the chest in 2 months for evaluation of the suspicious pulmonary nodules. For the renal insufficiency, we will continue to monitor her for now and repeat her blood work in 2 months. The patient was advised to call immediately if she has any other concerning symptoms in the interval. The patient voices understanding of current disease status and treatment options and is in agreement with the current care plan.  All questions were  answered. The patient knows to call the clinic with any problems, questions or concerns. We can certainly see the patient much sooner if necessary.  The total time spent in the appointment was 35 minutes.  Disclaimer: This note was dictated with voice recognition software. Similar sounding words can inadvertently be transcribed and may not be corrected upon review.

## 2020-08-29 ENCOUNTER — Other Ambulatory Visit: Payer: Medicare Other

## 2020-08-29 ENCOUNTER — Ambulatory Visit: Payer: Medicare Other | Admitting: Nurse Practitioner

## 2020-08-30 LAB — CEA (IN HOUSE-CHCC): CEA (CHCC-In House): 1.66 ng/mL (ref 0.00–5.00)

## 2020-09-27 DIAGNOSIS — E441 Mild protein-calorie malnutrition: Secondary | ICD-10-CM | POA: Diagnosis not present

## 2020-09-27 DIAGNOSIS — M25552 Pain in left hip: Secondary | ICD-10-CM | POA: Diagnosis not present

## 2020-09-27 DIAGNOSIS — N1831 Chronic kidney disease, stage 3a: Secondary | ICD-10-CM | POA: Diagnosis not present

## 2020-09-27 DIAGNOSIS — E222 Syndrome of inappropriate secretion of antidiuretic hormone: Secondary | ICD-10-CM | POA: Diagnosis not present

## 2020-09-27 DIAGNOSIS — I129 Hypertensive chronic kidney disease with stage 1 through stage 4 chronic kidney disease, or unspecified chronic kidney disease: Secondary | ICD-10-CM | POA: Diagnosis not present

## 2020-09-27 DIAGNOSIS — E1169 Type 2 diabetes mellitus with other specified complication: Secondary | ICD-10-CM | POA: Diagnosis not present

## 2020-10-09 ENCOUNTER — Telehealth: Payer: Self-pay | Admitting: *Deleted

## 2020-10-09 NOTE — Telephone Encounter (Signed)
Received vm call earlier from pt asking about CT scan with contrast.  Returned call & she states that she is just wondering why she will have contrast b/c other scans she has had done were without contrast.  She also reports that she is a hard stick & has concerns about starting IV for contrast.  Informed that contrast will probably give Dr Julien Nordmann a better picture of what is going on in the chest.  She denies allergy to contrast or kidney problems.  Message routed to Dr Mohamed/pod RN to change order if necessary & let pt know if changed.

## 2020-10-10 ENCOUNTER — Other Ambulatory Visit: Payer: Self-pay | Admitting: Medical Oncology

## 2020-10-10 DIAGNOSIS — R911 Solitary pulmonary nodule: Secondary | ICD-10-CM

## 2020-10-10 DIAGNOSIS — C099 Malignant neoplasm of tonsil, unspecified: Secondary | ICD-10-CM

## 2020-10-10 NOTE — Telephone Encounter (Signed)
CT -pt does not want IV contrast. Pt was adamant about not getting IV contrast due due to her poor venous access. "I am such a hard stick". Order changed.

## 2020-10-30 ENCOUNTER — Other Ambulatory Visit: Payer: Self-pay | Admitting: Geriatric Medicine

## 2020-10-30 ENCOUNTER — Other Ambulatory Visit: Payer: Self-pay

## 2020-10-30 ENCOUNTER — Ambulatory Visit
Admission: RE | Admit: 2020-10-30 | Discharge: 2020-10-30 | Disposition: A | Discharge status: Home / Self Care | Payer: Medicare Other | Source: Ambulatory Visit | Attending: Geriatric Medicine | Admitting: Geriatric Medicine

## 2020-10-30 DIAGNOSIS — S2242XA Multiple fractures of ribs, left side, initial encounter for closed fracture: Secondary | ICD-10-CM | POA: Diagnosis not present

## 2020-10-30 DIAGNOSIS — R0789 Other chest pain: Secondary | ICD-10-CM

## 2020-10-30 DIAGNOSIS — J984 Other disorders of lung: Secondary | ICD-10-CM | POA: Diagnosis not present

## 2020-11-04 ENCOUNTER — Ambulatory Visit (HOSPITAL_COMMUNITY)
Admission: RE | Admit: 2020-11-04 | Discharge: 2020-11-04 | Disposition: A | Discharge status: Home / Self Care | Payer: Medicare Other | Source: Ambulatory Visit | Attending: Internal Medicine | Admitting: Internal Medicine

## 2020-11-04 ENCOUNTER — Other Ambulatory Visit: Payer: Self-pay

## 2020-11-04 ENCOUNTER — Ambulatory Visit: Payer: Medicare Other | Admitting: Internal Medicine

## 2020-11-04 DIAGNOSIS — R911 Solitary pulmonary nodule: Secondary | ICD-10-CM | POA: Diagnosis not present

## 2020-11-04 DIAGNOSIS — J439 Emphysema, unspecified: Secondary | ICD-10-CM | POA: Diagnosis not present

## 2020-11-05 ENCOUNTER — Inpatient Hospital Stay: Payer: Medicare Other | Attending: Internal Medicine | Admitting: Internal Medicine

## 2020-11-05 VITALS — BP 126/69 | HR 87 | Temp 98.8°F | Resp 17 | Ht 65.0 in | Wt 109.2 lb

## 2020-11-05 DIAGNOSIS — Z85038 Personal history of other malignant neoplasm of large intestine: Secondary | ICD-10-CM | POA: Diagnosis not present

## 2020-11-05 DIAGNOSIS — C3412 Malignant neoplasm of upper lobe, left bronchus or lung: Secondary | ICD-10-CM | POA: Insufficient documentation

## 2020-11-05 DIAGNOSIS — C182 Malignant neoplasm of ascending colon: Secondary | ICD-10-CM

## 2020-11-05 DIAGNOSIS — Z7982 Long term (current) use of aspirin: Secondary | ICD-10-CM | POA: Insufficient documentation

## 2020-11-05 DIAGNOSIS — J449 Chronic obstructive pulmonary disease, unspecified: Secondary | ICD-10-CM | POA: Insufficient documentation

## 2020-11-05 DIAGNOSIS — E7800 Pure hypercholesterolemia, unspecified: Secondary | ICD-10-CM | POA: Diagnosis not present

## 2020-11-05 DIAGNOSIS — E1136 Type 2 diabetes mellitus with diabetic cataract: Secondary | ICD-10-CM | POA: Diagnosis not present

## 2020-11-05 DIAGNOSIS — I10 Essential (primary) hypertension: Secondary | ICD-10-CM | POA: Insufficient documentation

## 2020-11-05 DIAGNOSIS — C3490 Malignant neoplasm of unspecified part of unspecified bronchus or lung: Secondary | ICD-10-CM

## 2020-11-05 DIAGNOSIS — Z8589 Personal history of malignant neoplasm of other organs and systems: Secondary | ICD-10-CM | POA: Diagnosis present

## 2020-11-05 DIAGNOSIS — Z85818 Personal history of malignant neoplasm of other sites of lip, oral cavity, and pharynx: Secondary | ICD-10-CM | POA: Insufficient documentation

## 2020-11-05 DIAGNOSIS — Z79899 Other long term (current) drug therapy: Secondary | ICD-10-CM | POA: Diagnosis not present

## 2020-11-05 DIAGNOSIS — C099 Malignant neoplasm of tonsil, unspecified: Secondary | ICD-10-CM | POA: Diagnosis not present

## 2020-11-05 NOTE — Progress Notes (Signed)
[place] [phone]   Fax:(336) [phone]  OFFICE PROGRESS NOTE  Lajean Manes, MD [address] [place] [address]  DIAGNOSIS:  Stage II poorly differentiated adenocarcinoma of the ascending colon (T3N0), status post a right colectomy 10/30/2016 MSI-high, loss of MLH1 and PMS2 MLH1 methylation detected Surveillance CT scans at [facility] on 02/02/2017-negative for recurrent colon cancer Surveillance colonoscopy 10/04/2017-patent end-to-side ileocolonic anastomosis; diverticulosis in the sigmoid colon; examination otherwise normal.  Next colonoscopy 3 years   2.   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  Presumed stage Ia non-small cell lung cancer with a nodule in the left upper lobe.   PRIOR THERAPY: status post SBRT under the care of Dr. Lisbeth Renshaw.  The biopsy showed atypia but no confirmed malignancy.  CURRENT THERAPY: Observation.   INTERVAL HISTORY: Courtney Bowen 75 y.o. female returns to the clinic today for follow-up visit.  The patient is feeling fine today with no concerning complaints except for mild shortness of breath.  She has a fracture of the left ribs after her radiotherapy.  She tolerated the procedure fairly well.  She denied having any current chest pain, cough or hemoptysis.  She denied having any nausea, vomiting, diarrhea or constipation.  She has no headache or visual changes.  She had repeat CT scan of the chest performed recently and she is here for evaluation and discussion of her risk her results.  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  MEDICATIONS:  Current Outpatient Medications  Medication Sig Dispense Refill   alendronate (FOSAMAX) 70 MG tablet Take 70 mg by mouth once a week.     amLODipine (NORVASC) 2.5 MG tablet Take 2.5 mg by mouth every evening.      aspirin EC 81 MG tablet Take 81 mg by mouth daily.      atorvastatin (LIPITOR) 20 MG tablet Take 20 mg by mouth daily.     cholecalciferol (VITAMIN D) 400 UNITS TABS Take 400 Units by mouth daily.      docusate sodium (COLACE) 100 MG capsule Take 100 mg by mouth in the morning and at bedtime.     losartan (COZAAR) 50 MG tablet Take 50 mg by mouth every evening     nicotine polacrilex (NICORETTE) 4 MG gum Take 4 mg by mouth as needed for smoking cessation.      omeprazole (PRILOSEC) 20 MG capsule Take 20 mg by mouth daily.     polyethylene glycol (MIRALAX / GLYCOLAX) packet Take 17 g by mouth daily as needed.      No current facility-administered medications for this visit.    SURGICAL HISTORY:  Past Surgical History:  Procedure Laterality Date   BREAST BIOPSY     hx of   CHOLECYSTECTOMY  20 yrs ago   laproscopic   COLONOSCOPY     FUDUCIAL PLACEMENT N/A 07/19/2019   Procedure: PLACEMENT OF FUDUCIAL;  Surgeon: [name], MD;  Location: [place];  Service: Thoracic;  Laterality: N/A;   LUNG BIOPSY Left 07/19/2019   Procedure: LUNG BIOPSY;  Surgeon: [name], MD;  Location: [place];  Service: Thoracic;  Laterality: Left;   TONSILLECTOMY     VIDEO BRONCHOSCOPY WITH ENDOBRONCHIAL NAVIGATION N/A 07/19/2019   Procedure: VIDEO BRONCHOSCOPY WITH ENDOBRONCHIAL NAVIGATION with Biopsy, Fuducial placement;  Surgeon: [name],  MD;  Location: [place];  Service: Thoracic;  Laterality: N/A;   WISDOM TOOTH EXTRACTION      REVIEW OF SYSTEMS:  A comprehensive review of systems was negative except for: Respiratory: positive for dyspnea on exertion   PHYSICAL EXAMINATION: General appearance: alert, cooperative, fatigued, and no distress Head: Normocephalic, without obvious abnormality, atraumatic Neck: no adenopathy, no JVD, supple, symmetrical, trachea midline, and thyroid not enlarged, symmetric, no tenderness/mass/nodules Lymph nodes: Cervical, supraclavicular, and axillary nodes normal. Resp: clear to auscultation bilaterally Back: symmetric, no curvature. ROM normal. No CVA tenderness. Cardio: regular rate and rhythm, S1, S2 normal, no murmur, click, rub or gallop GI: soft, non-tender; bowel sounds normal; no masses,  no organomegaly Extremities: extremities normal, atraumatic, no cyanosis or edema  ECOG PERFORMANCE STATUS: 1 - Symptomatic but completely ambulatory  Blood pressure 126/69, pulse 87, temperature 98.8 F (37.1 C), temperature source Oral, resp. rate 17, height 5' 5"  (1.651 m), weight 109 lb 3.2 oz (49.5 kg), SpO2 100 %.  LABORATORY DATA: Lab Results  Component Value Date   WBC 4.1 07/17/2019   HGB 11.8 (L) 07/17/2019   HCT 35.1 (L) 07/17/2019   MCV 74.8 (L) 07/17/2019   PLT 234 07/17/2019      Chemistry      Component Value Date/Time   NA 129 (L) 07/17/2019 1411   K 4.3 07/17/2019 1411   CL 95 (L) 07/17/2019 1411   CO2 23 07/17/2019 1411   BUN 12 08/28/2020 1304   BUN 5.4 (L) 11/09/2012 1033   CREATININE 1.16 (H) 08/28/2020 1304   CREATININE 0.8 11/09/2012 1033      Component Value Date/Time   CALCIUM 9.6 07/17/2019 1411   CALCIUM 6.9 (L) 09/24/2010 0500   ALKPHOS 63 07/17/2019 1411   AST 21 07/17/2019 1411   ALT 14 07/17/2019 1411   BILITOT 0.5 07/17/2019 1411       RADIOGRAPHIC STUDIES: DG Ribs Unilateral W/Chest Left  Result Date: 10/31/2020 CLINICAL DATA:  Left-sided  chest wall pain. EXAM: LEFT RIBS AND CHEST - 3+ VIEW COMPARISON:  CT 05/07/2020.  Chest x-ray 07/19/2019. FINDINGS: Mediastinum and hilar structures normal. Heart size normal. Surgical clips left mid lung with stable adjacent density. Density right apex best identified by prior CT. Biapical pleural-parenchymal again noted. No prominent pleural effusion. No pneumothorax. Left posterolateral minimally displaced fifth rib fracture noted. IMPRESSION: 1. Left posterolateral minimally displaced fifth rib fracture. No pneumothorax. 2.  Chest is otherwise stable as above. Electronically Signed   By: Marcello Moores  Register   On: 10/31/2020 05:15   CT Chest Wo Contrast  Result Date: 11/05/2020 CLINICAL DATA:  Lung nodule surveillance. Prior SBRT of left upper lobe pulmonary nodule EXAM: CT CHEST WITHOUT CONTRAST TECHNIQUE: Multidetector CT imaging of the chest was performed following the standard protocol without IV contrast.  COMPARISON:  05/07/2020 FINDINGS: Cardiovascular: Coronary, aortic arch, and branch vessel atherosclerotic vascular disease. Mediastinum/Nodes: Lower paratracheal lymph node 0.9 cm in short axis on image 66 series 2, previously the same. Stable small indistinct AP window lymph nodes. Lungs/Pleura: Emphysema.  Biapical pleuroparenchymal scarring. Right upper lobe nodule 9 by 4 by 8 mm (volume = 200 mm^3), previously the same on 05/07/2020 and previously the same on 07/12/2015. Pleural-based density with mild adjacent reticulation anteriorly in the right upper lobe on image 54 series 7, pleural thickening up to 0.4 cm, stable from 05/07/2020, the pleural thickening is increased from 07/02/2015 although the local interstitial exaggeration is otherwise similar to that exam. Progressive scarring versus less likely low-grade neoplastic process. Fiducials noted along the lingula including adjacent to an 11 by 7 by 9 mm (volume = 400 mm^3) nodule on image 85 series 7 with surrounding architectural distortion and  density probably from radiation therapy, this nodule was previously 11 by 7 by 10 mm (volume = 400 mm^3) on 05/07/2020. Scattered scarring and small regions of architectural distortion although eyes stable in both lungs. Upper Abdomen: There is evidence of chronic calcific pancreatitis. Abdominal aortic atherosclerotic vascular disease. Musculoskeletal: Unremarkable IMPRESSION: 1. Increased hazy density around the lingular nodule which appears similar to minimally reduced in size. The increase surrounding density probably related to prior radiation therapy. 2. A right upper lobe nodule on image 26 of series 7 is chronically stable and probably benign. 3. Scattered regions of scarring and architectural distortion. These are stable. Strictly speaking, low-grade adenocarcinoma is not entirely excluded, and surveillance is probably warranted. 4. Other imaging findings of potential clinical significance: Aortic Atherosclerosis (ICD10-I70.0) and Emphysema (ICD10-J43.9). Coronary atherosclerosis. Chronic calcific pancreatitis. Electronically Signed   By: Van Clines M.D.   On: 11/05/2020 10:27    ASSESSMENT AND PLAN: This is a very pleasant 75 years old African-American female with the following medical problems. Stage II poorly differentiated adenocarcinoma of the ascending colon (T3N0), status post a right colectomy 10/30/2016 MSI-high, loss of MLH1 and PMS2 MLH1 methylation detected Surveillance CT scans at [facility] on 02/02/2017-negative for recurrent colon cancer Surveillance colonoscopy 10/04/2017-patent end-to-side ileocolonic anastomosis; diverticulosis in the sigmoid colon; examination otherwise normal.  Next colonoscopy 3 years   2.   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  Presumed stage Ia non-small cell lung cancer with a nodule in the left upper lobe status post SBRT under the care of Dr. Lisbeth Renshaw.  The biopsy showed atypia but no confirmed malignancy. She is  currently on observation and she is feeling fine today with no concerning complaints except for mild shortness of breath. She had repeat CT scan of the chest performed recently.  I personally and independently reviewed the scan images and discussed the results with the patient today. Her scan showed no concerning findings for disease recurrence or metastasis. I recommended for her to continue on observation with repeat CT scan of the chest in 6 months. The patient was advised to call immediately if she has any other concerning symptoms in the interval. The patient voices understanding of current disease status and treatment options and is in agreement with the current care plan.  All questions were answered. The patient knows to call the clinic with any problems, questions or concerns. We can certainly see the patient much sooner if necessary.  Disclaimer: This note was dictated with voice recognition software. Similar sounding words can inadvertently be transcribed and may not be corrected upon review.

## 2020-11-11 ENCOUNTER — Ambulatory Visit: Payer: Medicare Other | Admitting: Radiation Oncology

## 2021-01-29 DIAGNOSIS — N1831 Chronic kidney disease, stage 3a: Secondary | ICD-10-CM | POA: Diagnosis not present

## 2021-01-29 DIAGNOSIS — N183 Chronic kidney disease, stage 3 unspecified: Secondary | ICD-10-CM | POA: Diagnosis not present

## 2021-01-29 DIAGNOSIS — E441 Mild protein-calorie malnutrition: Secondary | ICD-10-CM | POA: Diagnosis not present

## 2021-01-29 DIAGNOSIS — E119 Type 2 diabetes mellitus without complications: Secondary | ICD-10-CM | POA: Diagnosis not present

## 2021-01-29 DIAGNOSIS — N644 Mastodynia: Secondary | ICD-10-CM | POA: Diagnosis not present

## 2021-01-29 DIAGNOSIS — E222 Syndrome of inappropriate secretion of antidiuretic hormone: Secondary | ICD-10-CM | POA: Diagnosis not present

## 2021-01-29 DIAGNOSIS — Z23 Encounter for immunization: Secondary | ICD-10-CM | POA: Diagnosis not present

## 2021-02-11 DIAGNOSIS — N644 Mastodynia: Secondary | ICD-10-CM | POA: Diagnosis not present

## 2021-02-11 DIAGNOSIS — R922 Inconclusive mammogram: Secondary | ICD-10-CM | POA: Diagnosis not present

## 2021-02-12 DIAGNOSIS — Z23 Encounter for immunization: Secondary | ICD-10-CM | POA: Diagnosis not present

## 2021-05-06 ENCOUNTER — Other Ambulatory Visit: Payer: Self-pay

## 2021-05-06 ENCOUNTER — Other Ambulatory Visit: Payer: Self-pay | Admitting: Internal Medicine

## 2021-05-06 ENCOUNTER — Inpatient Hospital Stay: Payer: Medicare Other | Attending: Internal Medicine

## 2021-05-06 ENCOUNTER — Ambulatory Visit (HOSPITAL_COMMUNITY)
Admission: RE | Admit: 2021-05-06 | Discharge: 2021-05-06 | Disposition: A | Discharge status: Home / Self Care | Payer: Medicare Other | Source: Ambulatory Visit | Attending: Internal Medicine | Admitting: Internal Medicine

## 2021-05-06 DIAGNOSIS — R918 Other nonspecific abnormal finding of lung field: Secondary | ICD-10-CM | POA: Diagnosis not present

## 2021-05-06 DIAGNOSIS — C3412 Malignant neoplasm of upper lobe, left bronchus or lung: Secondary | ICD-10-CM | POA: Insufficient documentation

## 2021-05-06 DIAGNOSIS — K219 Gastro-esophageal reflux disease without esophagitis: Secondary | ICD-10-CM | POA: Diagnosis not present

## 2021-05-06 DIAGNOSIS — J439 Emphysema, unspecified: Secondary | ICD-10-CM | POA: Diagnosis not present

## 2021-05-06 DIAGNOSIS — C3490 Malignant neoplasm of unspecified part of unspecified bronchus or lung: Secondary | ICD-10-CM | POA: Diagnosis not present

## 2021-05-06 DIAGNOSIS — I129 Hypertensive chronic kidney disease with stage 1 through stage 4 chronic kidney disease, or unspecified chronic kidney disease: Secondary | ICD-10-CM | POA: Diagnosis not present

## 2021-05-06 DIAGNOSIS — I10 Essential (primary) hypertension: Secondary | ICD-10-CM | POA: Insufficient documentation

## 2021-05-06 DIAGNOSIS — N1831 Chronic kidney disease, stage 3a: Secondary | ICD-10-CM | POA: Diagnosis not present

## 2021-05-06 DIAGNOSIS — Z85818 Personal history of malignant neoplasm of other sites of lip, oral cavity, and pharynx: Secondary | ICD-10-CM | POA: Insufficient documentation

## 2021-05-06 DIAGNOSIS — E119 Type 2 diabetes mellitus without complications: Secondary | ICD-10-CM | POA: Diagnosis not present

## 2021-05-06 DIAGNOSIS — E1169 Type 2 diabetes mellitus with other specified complication: Secondary | ICD-10-CM | POA: Diagnosis not present

## 2021-05-06 DIAGNOSIS — M810 Age-related osteoporosis without current pathological fracture: Secondary | ICD-10-CM | POA: Diagnosis not present

## 2021-05-06 DIAGNOSIS — D649 Anemia, unspecified: Secondary | ICD-10-CM | POA: Insufficient documentation

## 2021-05-06 DIAGNOSIS — E7800 Pure hypercholesterolemia, unspecified: Secondary | ICD-10-CM | POA: Diagnosis not present

## 2021-05-06 DIAGNOSIS — E1136 Type 2 diabetes mellitus with diabetic cataract: Secondary | ICD-10-CM | POA: Insufficient documentation

## 2021-05-06 DIAGNOSIS — R911 Solitary pulmonary nodule: Secondary | ICD-10-CM | POA: Diagnosis not present

## 2021-05-06 DIAGNOSIS — Z85038 Personal history of other malignant neoplasm of large intestine: Secondary | ICD-10-CM | POA: Insufficient documentation

## 2021-05-06 LAB — CBC WITH DIFFERENTIAL (CANCER CENTER ONLY)
Abs Immature Granulocytes: 0.02 10*3/uL (ref 0.00–0.07)
Basophils Absolute: 0.1 10*3/uL (ref 0.0–0.1)
Basophils Relative: 1 %
Eosinophils Absolute: 0.1 10*3/uL (ref 0.0–0.5)
Eosinophils Relative: 2 %
HCT: 29.7 % — ABNORMAL LOW (ref 36.0–46.0)
Hemoglobin: 9.9 g/dL — ABNORMAL LOW (ref 12.0–15.0)
Immature Granulocytes: 0 %
Lymphocytes Relative: 28 %
Lymphs Abs: 1.5 10*3/uL (ref 0.7–4.0)
MCH: 24.7 pg — ABNORMAL LOW (ref 26.0–34.0)
MCHC: 33.3 g/dL (ref 30.0–36.0)
MCV: 74.1 fL — ABNORMAL LOW (ref 80.0–100.0)
Monocytes Absolute: 0.7 10*3/uL (ref 0.1–1.0)
Monocytes Relative: 12 %
Neutro Abs: 3.1 10*3/uL (ref 1.7–7.7)
Neutrophils Relative %: 57 %
Platelet Count: 266 10*3/uL (ref 150–400)
RBC: 4.01 MIL/uL (ref 3.87–5.11)
RDW: 14.5 % (ref 11.5–15.5)
WBC Count: 5.4 10*3/uL (ref 4.0–10.5)
nRBC: 0 % (ref 0.0–0.2)

## 2021-05-06 LAB — CEA (IN HOUSE-CHCC): CEA (CHCC-In House): 1.75 ng/mL (ref 0.00–5.00)

## 2021-05-06 LAB — CMP (CANCER CENTER ONLY)
ALT: 10 U/L (ref 0–44)
AST: 14 U/L — ABNORMAL LOW (ref 15–41)
Albumin: 4.5 g/dL (ref 3.5–5.0)
Alkaline Phosphatase: 66 U/L (ref 38–126)
Anion gap: 10 (ref 5–15)
BUN: 13 mg/dL (ref 8–23)
CO2: 26 mmol/L (ref 22–32)
Calcium: 9.9 mg/dL (ref 8.9–10.3)
Chloride: 97 mmol/L — ABNORMAL LOW (ref 98–111)
Creatinine: 1.28 mg/dL — ABNORMAL HIGH (ref 0.44–1.00)
GFR, Estimated: 44 mL/min — ABNORMAL LOW (ref >60)
Glucose, Bld: 125 mg/dL — ABNORMAL HIGH (ref 70–99)
Potassium: 4.6 mmol/L (ref 3.5–5.1)
Sodium: 133 mmol/L — ABNORMAL LOW (ref 135–145)
Total Bilirubin: 0.4 mg/dL (ref 0.3–1.2)
Total Protein: 7.6 g/dL (ref 6.5–8.1)

## 2021-05-06 MED ORDER — SODIUM CHLORIDE (PF) 0.9 % IJ SOLN
INTRAMUSCULAR | Status: AC
  Filled 2021-05-06: qty 50

## 2021-05-08 ENCOUNTER — Encounter: Payer: Self-pay | Admitting: Internal Medicine

## 2021-05-08 ENCOUNTER — Other Ambulatory Visit: Payer: Self-pay

## 2021-05-08 ENCOUNTER — Inpatient Hospital Stay (HOSPITAL_BASED_OUTPATIENT_CLINIC_OR_DEPARTMENT_OTHER): Payer: Medicare Other | Admitting: Internal Medicine

## 2021-05-08 VITALS — BP 140/77 | HR 90 | Temp 97.9°F | Resp 17 | Ht 65.0 in | Wt 109.5 lb

## 2021-05-08 DIAGNOSIS — C182 Malignant neoplasm of ascending colon: Secondary | ICD-10-CM | POA: Diagnosis not present

## 2021-05-08 DIAGNOSIS — E1136 Type 2 diabetes mellitus with diabetic cataract: Secondary | ICD-10-CM | POA: Diagnosis not present

## 2021-05-08 DIAGNOSIS — Z85038 Personal history of other malignant neoplasm of large intestine: Secondary | ICD-10-CM | POA: Diagnosis not present

## 2021-05-08 DIAGNOSIS — C3490 Malignant neoplasm of unspecified part of unspecified bronchus or lung: Secondary | ICD-10-CM

## 2021-05-08 DIAGNOSIS — D649 Anemia, unspecified: Secondary | ICD-10-CM | POA: Diagnosis not present

## 2021-05-08 DIAGNOSIS — Z85818 Personal history of malignant neoplasm of other sites of lip, oral cavity, and pharynx: Secondary | ICD-10-CM | POA: Diagnosis not present

## 2021-05-08 DIAGNOSIS — I10 Essential (primary) hypertension: Secondary | ICD-10-CM | POA: Diagnosis not present

## 2021-05-08 DIAGNOSIS — C3412 Malignant neoplasm of upper lobe, left bronchus or lung: Secondary | ICD-10-CM | POA: Diagnosis not present

## 2021-05-08 NOTE — Progress Notes (Signed)
[place] Telephone:(336) [phone]   Fax:(336) [phone]  OFFICE PROGRESS NOTE  Lajean Manes, MD [address] [place] [address]  DIAGNOSIS:  Stage II poorly differentiated adenocarcinoma of the ascending colon (T3N0), status post a right colectomy 10/30/2016 MSI-high, loss of MLH1 and PMS2 MLH1 methylation detected Surveillance CT scans at [facility] on 02/02/2017-negative for recurrent colon cancer Surveillance colonoscopy 10/04/2017-patent end-to-side ileocolonic anastomosis; diverticulosis in the sigmoid colon; examination otherwise normal.  Next colonoscopy 3 years   2.   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  Presumed stage Ia non-small cell lung cancer with a nodule in the left upper lobe.   PRIOR THERAPY: status post SBRT under the care of Dr. Lisbeth Renshaw.  The biopsy showed atypia but no confirmed malignancy.  CURRENT THERAPY: Observation.   INTERVAL HISTORY: Courtney Bowen 76 y.o. female returns to the clinic today for follow-up visit.  The patient is feeling fine today with no concerning complaints except for mild fatigue.  She denied having any current chest pain, shortness of breath, cough or hemoptysis.  She denied having any fever or chills.  She has no nausea, vomiting, diarrhea or constipation.  She has no headache or visual changes.  She is currently on observation and had repeat CT scan of the chest performed recently.  The patient is here for evaluation and discussion of her scan results.   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  MEDICATIONS:  Current Outpatient Medications  Medication Sig Dispense Refill   alendronate (FOSAMAX) 70 MG tablet Take 70 mg by mouth once a week.     amLODipine (NORVASC) 5 MG tablet Take 5 mg by mouth daily.     aspirin EC 81 MG tablet Take 81 mg by mouth daily.      atorvastatin (LIPITOR) 20 MG tablet Take 20 mg by mouth daily.     cholecalciferol (VITAMIN D) 400 UNITS TABS Take 400 Units by mouth daily.      docusate sodium (COLACE) 100 MG capsule Take 100 mg by mouth in the morning and at bedtime.     losartan (COZAAR) 50 MG tablet Take 50 mg by mouth every evening     nicotine polacrilex (NICORETTE) 4 MG gum Take 4 mg by mouth as needed for smoking cessation.      omeprazole (PRILOSEC) 20 MG capsule Take 20 mg by mouth daily.     polyethylene glycol (MIRALAX / GLYCOLAX) packet Take 17 g by mouth daily as needed.      No current facility-administered medications for this visit.    SURGICAL HISTORY:  Past Surgical History:  Procedure Laterality Date  BREAST BIOPSY     hx of   CHOLECYSTECTOMY  20 yrs ago   laproscopic   COLONOSCOPY     FUDUCIAL PLACEMENT N/A 07/19/2019   Procedure: PLACEMENT OF FUDUCIAL;  Surgeon: [name], MD;  Location: [place];  Service: Thoracic;  Laterality: N/A;   LUNG BIOPSY Left 07/19/2019   Procedure: LUNG BIOPSY;  Surgeon: [name], MD;  Location: [place];  Service: Thoracic;  Laterality: Left;   TONSILLECTOMY     VIDEO BRONCHOSCOPY WITH ENDOBRONCHIAL NAVIGATION N/A 07/19/2019   Procedure: VIDEO BRONCHOSCOPY WITH ENDOBRONCHIAL NAVIGATION with Biopsy, Fuducial placement;  Surgeon: [name], MD;  Location: [place];  Service: Thoracic;   Laterality: N/A;   WISDOM TOOTH EXTRACTION      REVIEW OF SYSTEMS:  A comprehensive review of systems was negative except for: Constitutional: positive for fatigue   PHYSICAL EXAMINATION: General appearance: alert, cooperative, fatigued, and no distress Head: Normocephalic, without obvious abnormality, atraumatic Neck: no adenopathy, no JVD, supple, symmetrical, trachea midline, and thyroid not enlarged, symmetric, no tenderness/mass/nodules Lymph nodes: Cervical, supraclavicular, and axillary nodes normal. Resp: clear to auscultation bilaterally Back: symmetric, no curvature. ROM normal. No CVA tenderness. Cardio: regular rate and rhythm, S1, S2 normal, no murmur, click, rub or gallop GI: soft, non-tender; bowel sounds normal; no masses,  no organomegaly Extremities: extremities normal, atraumatic, no cyanosis or edema  ECOG PERFORMANCE STATUS: 1 - Symptomatic but completely ambulatory  Blood pressure 140/77, pulse 90, temperature 97.9 F (36.6 C), temperature source Tympanic, resp. rate 17, height 5' 5"  (1.651 m), weight 109 lb 8 oz (49.7 kg), SpO2 100 %.  LABORATORY DATA: Lab Results  Component Value Date   WBC 5.4 05/06/2021   HGB 9.9 (L) 05/06/2021   HCT 29.7 (L) 05/06/2021   MCV 74.1 (L) 05/06/2021   PLT 266 05/06/2021      Chemistry      Component Value Date/Time   NA 133 (L) 05/06/2021 0943   K 4.6 05/06/2021 0943   CL 97 (L) 05/06/2021 0943   CO2 26 05/06/2021 0943   BUN 13 05/06/2021 0943   BUN 5.4 (L) 11/09/2012 1033   CREATININE 1.28 (H) 05/06/2021 0943   CREATININE 0.8 11/09/2012 1033      Component Value Date/Time   CALCIUM 9.9 05/06/2021 0943   CALCIUM 6.9 (L) 09/24/2010 0500   ALKPHOS 66 05/06/2021 0943   AST 14 (L) 05/06/2021 0943   ALT 10 05/06/2021 0943   BILITOT 0.4 05/06/2021 0943       RADIOGRAPHIC STUDIES: CT CHEST WO CONTRAST  Result Date: 05/06/2021 CLINICAL DATA:  Non-small cell lung cancer LEFT upper lobe. Status post SB RT. EXAM: CT  CHEST WITHOUT CONTRAST TECHNIQUE: Multidetector CT imaging of the chest was performed following the standard protocol without IV contrast. RADIATION DOSE REDUCTION: This exam was performed according to the departmental dose-optimization program which includes automated exposure control, adjustment of the mA and/or kV according to patient size and/or use of iterative reconstruction technique. COMPARISON:  Chest CT 11/04/2020 FINDINGS: Cardiovascular: No significant vascular findings. Normal heart size. No pericardial effusion. Mediastinum/Nodes: No axillary or supraclavicular adenopathy. No mediastinal or hilar adenopathy. No pericardial fluid. Esophagus normal. Lungs/Pleura: fiducial markers in the LEFT lower lobe adjacent to treated nodule. Nodule measures 9 mm (image 82/5) compared to 11 mm. Mild interstitial thickening surrounding the nodule similar prior. Centrilobular emphysema the upper lobes. RIGHT upper lobe nodule measuring 9 mm (image 21/5) is unchanged. Several foci subpleural thickening and scattered small  nodules are unchanged. Upper Abdomen: Limited view of the liver, kidneys, pancreas are unremarkable. Normal adrenal glands. Musculoskeletal: No aggressive osseous lesion. IMPRESSION: 1. Stable radiation treated LEFT lobe nodule. 2. Stable RIGHT upper lobe nodule. Additional smaller pulmonary nodules are stable. 3. Centrilobular emphysema with an upper lobe predominance. 4. No lymphadenopathy Electronically Signed   By: Suzy Bouchard M.D.   On: 05/06/2021 15:18    ASSESSMENT AND PLAN: This is a very pleasant 76 years old African-American female with the following medical problems. Stage II poorly differentiated adenocarcinoma of the ascending colon (T3N0), status post a right colectomy 10/30/2016 MSI-high, loss of MLH1 and PMS2 MLH1 methylation detected Surveillance CT scans at [facility] on 02/02/2017-negative for recurrent colon cancer Surveillance colonoscopy 10/04/2017-patent end-to-side ileocolonic  anastomosis; diverticulosis in the sigmoid colon; examination otherwise normal.  Next colonoscopy scheduled to be done in April 2023.   2.   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  Presumed stage Ia non-small cell lung cancer with a nodule in the left upper lobe status post SBRT under the care of Dr. Lisbeth Renshaw.  The biopsy showed atypia but no confirmed malignancy. The patient is currently on observation and she is feeling fine with no concerning complaints except for mild fatigue. She had repeat CT scan of the chest performed recently.  I personally and independently reviewed the scans and discussed the results with the patient today. Her scan showed no concerning findings for disease recurrence or metastasis. I recommended for her to continue on observation with repeat CT scan of the chest in 6 months. Regarding the persistent anemia, she is scheduled to have repeat colonoscopy in April 2023.  I also recommend for the patient to increase the iron supplements in her diet or take over-the-counter iron tablets few times a week. She was advised to call immediately if she has any other concerning symptoms in the interval. The patient voices understanding of current disease status and treatment options and is in agreement with the current care plan.  All questions were answered. The patient knows to call the clinic with any problems, questions or concerns. We can certainly see the patient much sooner if necessary.  Disclaimer: This note was dictated with voice recognition software. Similar sounding words can inadvertently be transcribed and may not be corrected upon review.

## 2021-06-10 DIAGNOSIS — N1831 Chronic kidney disease, stage 3a: Secondary | ICD-10-CM | POA: Diagnosis not present

## 2021-06-10 DIAGNOSIS — E7800 Pure hypercholesterolemia, unspecified: Secondary | ICD-10-CM | POA: Diagnosis not present

## 2021-06-10 DIAGNOSIS — E1169 Type 2 diabetes mellitus with other specified complication: Secondary | ICD-10-CM | POA: Diagnosis not present

## 2021-07-30 DIAGNOSIS — Z85038 Personal history of other malignant neoplasm of large intestine: Secondary | ICD-10-CM | POA: Diagnosis not present

## 2021-07-30 DIAGNOSIS — Z980 Intestinal bypass and anastomosis status: Secondary | ICD-10-CM | POA: Diagnosis not present

## 2021-08-11 DIAGNOSIS — E1169 Type 2 diabetes mellitus with other specified complication: Secondary | ICD-10-CM | POA: Diagnosis not present

## 2021-08-11 DIAGNOSIS — Z1389 Encounter for screening for other disorder: Secondary | ICD-10-CM | POA: Diagnosis not present

## 2021-08-11 DIAGNOSIS — Z79899 Other long term (current) drug therapy: Secondary | ICD-10-CM | POA: Diagnosis not present

## 2021-08-11 DIAGNOSIS — I700 Atherosclerosis of aorta: Secondary | ICD-10-CM | POA: Diagnosis not present

## 2021-08-11 DIAGNOSIS — D500 Iron deficiency anemia secondary to blood loss (chronic): Secondary | ICD-10-CM | POA: Diagnosis not present

## 2021-08-11 DIAGNOSIS — Z0000 Encounter for general adult medical examination without abnormal findings: Secondary | ICD-10-CM | POA: Diagnosis not present

## 2021-08-29 DIAGNOSIS — M25551 Pain in right hip: Secondary | ICD-10-CM | POA: Diagnosis not present

## 2021-09-03 ENCOUNTER — Ambulatory Visit
Admission: RE | Admit: 2021-09-03 | Discharge: 2021-09-03 | Disposition: A | Discharge status: Home / Self Care | Payer: Medicare Other | Source: Ambulatory Visit | Attending: Sports Medicine | Admitting: Sports Medicine

## 2021-09-03 ENCOUNTER — Other Ambulatory Visit: Payer: Self-pay | Admitting: Sports Medicine

## 2021-09-03 DIAGNOSIS — M4316 Spondylolisthesis, lumbar region: Secondary | ICD-10-CM | POA: Diagnosis not present

## 2021-09-03 DIAGNOSIS — M545 Low back pain, unspecified: Secondary | ICD-10-CM | POA: Diagnosis not present

## 2021-09-03 DIAGNOSIS — R52 Pain, unspecified: Secondary | ICD-10-CM

## 2021-09-03 DIAGNOSIS — M25551 Pain in right hip: Secondary | ICD-10-CM | POA: Diagnosis not present

## 2021-09-10 DIAGNOSIS — M7061 Trochanteric bursitis, right hip: Secondary | ICD-10-CM | POA: Diagnosis not present

## 2021-09-10 DIAGNOSIS — R262 Difficulty in walking, not elsewhere classified: Secondary | ICD-10-CM | POA: Diagnosis not present

## 2021-09-10 DIAGNOSIS — M6281 Muscle weakness (generalized): Secondary | ICD-10-CM | POA: Diagnosis not present

## 2021-09-16 DIAGNOSIS — M7061 Trochanteric bursitis, right hip: Secondary | ICD-10-CM | POA: Diagnosis not present

## 2021-09-16 DIAGNOSIS — M6281 Muscle weakness (generalized): Secondary | ICD-10-CM | POA: Diagnosis not present

## 2021-09-16 DIAGNOSIS — R262 Difficulty in walking, not elsewhere classified: Secondary | ICD-10-CM | POA: Diagnosis not present

## 2021-09-18 DIAGNOSIS — M7061 Trochanteric bursitis, right hip: Secondary | ICD-10-CM | POA: Diagnosis not present

## 2021-09-18 DIAGNOSIS — R262 Difficulty in walking, not elsewhere classified: Secondary | ICD-10-CM | POA: Diagnosis not present

## 2021-09-18 DIAGNOSIS — M6281 Muscle weakness (generalized): Secondary | ICD-10-CM | POA: Diagnosis not present

## 2021-09-23 ENCOUNTER — Other Ambulatory Visit: Payer: Self-pay | Admitting: Sports Medicine

## 2021-09-23 DIAGNOSIS — M79604 Pain in right leg: Secondary | ICD-10-CM

## 2021-09-23 DIAGNOSIS — M5416 Radiculopathy, lumbar region: Secondary | ICD-10-CM

## 2021-09-25 DIAGNOSIS — M5441 Lumbago with sciatica, right side: Secondary | ICD-10-CM | POA: Diagnosis not present

## 2021-10-04 ENCOUNTER — Ambulatory Visit
Admission: RE | Admit: 2021-10-04 | Discharge: 2021-10-04 | Disposition: A | Discharge status: Home / Self Care | Payer: Medicare Other | Source: Ambulatory Visit | Attending: Sports Medicine | Admitting: Sports Medicine

## 2021-10-04 DIAGNOSIS — M79604 Pain in right leg: Secondary | ICD-10-CM

## 2021-10-04 DIAGNOSIS — M4316 Spondylolisthesis, lumbar region: Secondary | ICD-10-CM | POA: Diagnosis not present

## 2021-10-04 DIAGNOSIS — M5416 Radiculopathy, lumbar region: Secondary | ICD-10-CM

## 2021-10-04 DIAGNOSIS — M545 Low back pain, unspecified: Secondary | ICD-10-CM | POA: Diagnosis not present

## 2021-10-04 DIAGNOSIS — M25551 Pain in right hip: Secondary | ICD-10-CM | POA: Diagnosis not present

## 2021-10-14 ENCOUNTER — Telehealth: Payer: Self-pay | Admitting: Internal Medicine

## 2021-10-14 DIAGNOSIS — M5416 Radiculopathy, lumbar region: Secondary | ICD-10-CM | POA: Diagnosis not present

## 2021-10-14 NOTE — Telephone Encounter (Signed)
Called patient regarding upcoming July appointments, patient has been called and notified.

## 2021-10-14 NOTE — Telephone Encounter (Signed)
Patient called to confirm appointments. Patient is notified.

## 2021-10-20 DIAGNOSIS — M5441 Lumbago with sciatica, right side: Secondary | ICD-10-CM | POA: Diagnosis not present

## 2021-10-24 DIAGNOSIS — M545 Low back pain, unspecified: Secondary | ICD-10-CM | POA: Diagnosis not present

## 2021-10-29 ENCOUNTER — Other Ambulatory Visit: Payer: Self-pay | Admitting: Geriatric Medicine

## 2021-10-29 DIAGNOSIS — M5441 Lumbago with sciatica, right side: Secondary | ICD-10-CM | POA: Diagnosis not present

## 2021-11-03 ENCOUNTER — Other Ambulatory Visit: Payer: Self-pay

## 2021-11-03 ENCOUNTER — Other Ambulatory Visit: Payer: Medicare Other

## 2021-11-03 ENCOUNTER — Inpatient Hospital Stay: Payer: Medicare Other | Attending: Internal Medicine

## 2021-11-03 ENCOUNTER — Ambulatory Visit (HOSPITAL_COMMUNITY)
Admission: RE | Admit: 2021-11-03 | Discharge: 2021-11-03 | Disposition: A | Discharge status: Home / Self Care | Payer: Medicare Other | Source: Ambulatory Visit | Attending: Internal Medicine | Admitting: Internal Medicine

## 2021-11-03 ENCOUNTER — Other Ambulatory Visit: Payer: Self-pay | Admitting: Interventional Radiology

## 2021-11-03 DIAGNOSIS — C3490 Malignant neoplasm of unspecified part of unspecified bronchus or lung: Secondary | ICD-10-CM | POA: Diagnosis not present

## 2021-11-03 DIAGNOSIS — R911 Solitary pulmonary nodule: Secondary | ICD-10-CM | POA: Diagnosis not present

## 2021-11-03 DIAGNOSIS — Z79899 Other long term (current) drug therapy: Secondary | ICD-10-CM | POA: Insufficient documentation

## 2021-11-03 DIAGNOSIS — Z923 Personal history of irradiation: Secondary | ICD-10-CM | POA: Insufficient documentation

## 2021-11-03 DIAGNOSIS — Z9049 Acquired absence of other specified parts of digestive tract: Secondary | ICD-10-CM | POA: Insufficient documentation

## 2021-11-03 DIAGNOSIS — Z85038 Personal history of other malignant neoplasm of large intestine: Secondary | ICD-10-CM | POA: Insufficient documentation

## 2021-11-03 DIAGNOSIS — Z85818 Personal history of malignant neoplasm of other sites of lip, oral cavity, and pharynx: Secondary | ICD-10-CM | POA: Insufficient documentation

## 2021-11-03 DIAGNOSIS — Z7982 Long term (current) use of aspirin: Secondary | ICD-10-CM | POA: Insufficient documentation

## 2021-11-03 LAB — CBC WITH DIFFERENTIAL (CANCER CENTER ONLY)
Abs Immature Granulocytes: 0.08 10*3/uL — ABNORMAL HIGH (ref 0.00–0.07)
Basophils Absolute: 0 10*3/uL (ref 0.0–0.1)
Basophils Relative: 0 %
Eosinophils Absolute: 0 10*3/uL (ref 0.0–0.5)
Eosinophils Relative: 0 %
HCT: 28.7 % — ABNORMAL LOW (ref 36.0–46.0)
Hemoglobin: 10.4 g/dL — ABNORMAL LOW (ref 12.0–15.0)
Immature Granulocytes: 1 %
Lymphocytes Relative: 4 %
Lymphs Abs: 0.5 10*3/uL — ABNORMAL LOW (ref 0.7–4.0)
MCH: 26.3 pg (ref 26.0–34.0)
MCHC: 36.2 g/dL — ABNORMAL HIGH (ref 30.0–36.0)
MCV: 72.7 fL — ABNORMAL LOW (ref 80.0–100.0)
Monocytes Absolute: 0.3 10*3/uL (ref 0.1–1.0)
Monocytes Relative: 2 %
Neutro Abs: 10.8 10*3/uL — ABNORMAL HIGH (ref 1.7–7.7)
Neutrophils Relative %: 93 %
Platelet Count: 326 10*3/uL (ref 150–400)
RBC: 3.95 MIL/uL (ref 3.87–5.11)
RDW: 14.8 % (ref 11.5–15.5)
WBC Count: 11.7 10*3/uL — ABNORMAL HIGH (ref 4.0–10.5)
nRBC: 0 % (ref 0.0–0.2)

## 2021-11-03 LAB — CMP (CANCER CENTER ONLY)
ALT: 22 U/L (ref 0–44)
AST: 17 U/L (ref 15–41)
Albumin: 4.4 g/dL (ref 3.5–5.0)
Alkaline Phosphatase: 69 U/L (ref 38–126)
Anion gap: 11 (ref 5–15)
BUN: 25 mg/dL — ABNORMAL HIGH (ref 8–23)
CO2: 26 mmol/L (ref 22–32)
Calcium: 9.6 mg/dL (ref 8.9–10.3)
Chloride: 87 mmol/L — ABNORMAL LOW (ref 98–111)
Creatinine: 2.29 mg/dL — ABNORMAL HIGH (ref 0.44–1.00)
GFR, Estimated: 22 mL/min — ABNORMAL LOW (ref >60)
Glucose, Bld: 154 mg/dL — ABNORMAL HIGH (ref 70–99)
Potassium: 4.5 mmol/L (ref 3.5–5.1)
Sodium: 124 mmol/L — ABNORMAL LOW (ref 135–145)
Total Bilirubin: 0.5 mg/dL (ref 0.3–1.2)
Total Protein: 7.4 g/dL (ref 6.5–8.1)

## 2021-11-03 LAB — CEA (IN HOUSE-CHCC): CEA (CHCC-In House): 3.47 ng/mL (ref 0.00–5.00)

## 2021-11-05 ENCOUNTER — Inpatient Hospital Stay (HOSPITAL_BASED_OUTPATIENT_CLINIC_OR_DEPARTMENT_OTHER): Payer: Medicare Other | Admitting: Internal Medicine

## 2021-11-05 ENCOUNTER — Other Ambulatory Visit: Payer: Self-pay

## 2021-11-05 VITALS — BP 125/57 | HR 101 | Temp 98.0°F | Resp 18 | Ht 65.0 in | Wt 104.3 lb

## 2021-11-05 DIAGNOSIS — Z79899 Other long term (current) drug therapy: Secondary | ICD-10-CM | POA: Diagnosis not present

## 2021-11-05 DIAGNOSIS — Z85038 Personal history of other malignant neoplasm of large intestine: Secondary | ICD-10-CM | POA: Diagnosis not present

## 2021-11-05 DIAGNOSIS — Z85818 Personal history of malignant neoplasm of other sites of lip, oral cavity, and pharynx: Secondary | ICD-10-CM | POA: Diagnosis not present

## 2021-11-05 DIAGNOSIS — Z7982 Long term (current) use of aspirin: Secondary | ICD-10-CM | POA: Diagnosis not present

## 2021-11-05 DIAGNOSIS — Z9049 Acquired absence of other specified parts of digestive tract: Secondary | ICD-10-CM | POA: Diagnosis not present

## 2021-11-05 DIAGNOSIS — C3490 Malignant neoplasm of unspecified part of unspecified bronchus or lung: Secondary | ICD-10-CM

## 2021-11-05 DIAGNOSIS — Z923 Personal history of irradiation: Secondary | ICD-10-CM | POA: Diagnosis not present

## 2021-11-05 NOTE — Progress Notes (Signed)
[place] [phone]   Fax:([phone]  OFFICE PROGRESS NOTE  Lajean Manes, MD [address] [place] [address]  DIAGNOSIS:  Stage II poorly differentiated adenocarcinoma of the ascending colon (T3N0), status post a right colectomy 10/30/2016 MSI-high, loss of MLH1 and PMS2 MLH1 methylation detected Surveillance CT scans at [facility] on 02/02/2017-negative for recurrent colon cancer Surveillance colonoscopy 10/04/2017-patent end-to-side ileocolonic anastomosis; diverticulosis in the sigmoid colon; examination otherwise normal.  Next colonoscopy 3 years   2.   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  Presumed stage Ia non-small cell lung cancer with a nodule in the left upper lobe.   PRIOR THERAPY: status post SBRT under the care of Dr. Lisbeth Renshaw.  The biopsy showed atypia but no confirmed malignancy.  CURRENT THERAPY: Observation.   INTERVAL HISTORY: Courtney Bowen 76 y.o. female returns to the clinic today for follow-up visit.  The patient is feeling fine today with no concerning complaints except for fatigue and back pain.  She has been using a lot of pain medication specially ibuprofen and Tylenol in addition to prednisone for her back pain.  She denied having any current chest pain, shortness of breath, cough or hemoptysis.  She has no nausea, vomiting, diarrhea or constipation.  She has no headache or visual changes.  She was supposed to have repeat colonoscopy in April 2023 but the procedure was aborted because of poor preparation.  She is supposed to have repeat colonoscopy again in few weeks.  The patient had repeat CT scan of the chest performed recently and she is here for evaluation and discussion of her scan results.   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  MEDICATIONS:  Current Outpatient Medications  Medication Sig Dispense Refill   alendronate (FOSAMAX) 70 MG tablet Take 70 mg by mouth once a week.     amLODipine (NORVASC) 5 MG tablet Take 5 mg by mouth daily.     aspirin EC 81 MG tablet Take 81 mg by mouth daily.      atorvastatin (LIPITOR) 20 MG tablet Take 20 mg by mouth daily.     cholecalciferol (VITAMIN D) 400 UNITS TABS Take 400 Units by mouth daily.      docusate sodium (COLACE) 100 MG capsule Take 100 mg by mouth in the morning and at bedtime.     losartan (COZAAR) 50 MG tablet Take 50 mg by mouth every evening     nicotine polacrilex (NICORETTE) 4 MG gum Take 4 mg by mouth as needed for smoking cessation.      omeprazole (PRILOSEC) 20 MG capsule Take 20 mg by mouth daily.  polyethylene glycol (MIRALAX / GLYCOLAX) packet Take 17 g by mouth daily as needed.      No current facility-administered medications for this visit.    SURGICAL HISTORY:  Past Surgical History:  Procedure Laterality Date   BREAST BIOPSY     hx of   CHOLECYSTECTOMY  20 yrs ago   laproscopic   COLONOSCOPY     FUDUCIAL PLACEMENT N/A 07/19/2019   Procedure: PLACEMENT OF FUDUCIAL;  Surgeon: [name], MD;  Location: [facility];  Service: Thoracic;  Laterality: N/A;   LUNG BIOPSY Left 07/19/2019   Procedure: LUNG BIOPSY;  Surgeon: [name], MD;  Location: [facility];  Service: Thoracic;  Laterality: Left;    TONSILLECTOMY     VIDEO BRONCHOSCOPY WITH ENDOBRONCHIAL NAVIGATION N/A 07/19/2019   Procedure: VIDEO BRONCHOSCOPY WITH ENDOBRONCHIAL NAVIGATION with Biopsy, Fuducial placement;  Surgeon: [name], MD;  Location: [facility];  Service: Thoracic;  Laterality: N/A;   WISDOM TOOTH EXTRACTION      REVIEW OF SYSTEMS:  Constitutional: positive for fatigue Eyes: negative Ears, nose, mouth, throat, and face: negative Respiratory: negative Cardiovascular: negative Gastrointestinal: negative Genitourinary:negative Integument/breast: negative Hematologic/lymphatic: negative Musculoskeletal:positive for back pain Neurological: negative Behavioral/Psych: negative Endocrine: negative Allergic/Immunologic: negative   PHYSICAL EXAMINATION: General appearance: alert, cooperative, fatigued, and no distress Head: Normocephalic, without obvious abnormality, atraumatic Neck: no adenopathy, no JVD, supple, symmetrical, trachea midline, and thyroid not enlarged, symmetric, no tenderness/mass/nodules Lymph nodes: Cervical, supraclavicular, and axillary nodes normal. Resp: clear to auscultation bilaterally Back: symmetric, no curvature. ROM normal. No CVA tenderness. Cardio: regular rate and rhythm, S1, S2 normal, no murmur, click, rub or gallop GI: soft, non-tender; bowel sounds normal; no masses,  no organomegaly Extremities: extremities normal, atraumatic, no cyanosis or edema Neurologic: Alert and oriented X 3, normal strength and tone. Normal symmetric reflexes. Normal coordination and gait  ECOG PERFORMANCE STATUS: 1 - Symptomatic but completely ambulatory  Blood pressure (!) 125/57, pulse (!) 101, temperature 98 F (36.7 C), temperature source Tympanic, resp. rate 18, height $RemoveBe'5\' 5"'fNmcXlCyQ$  (1.651 m), weight 104 lb 4.8 oz (47.3 kg), SpO2 100 %.  LABORATORY DATA: Lab Results  Component Value Date   WBC 11.7 (H) 11/03/2021   HGB 10.4 (L) 11/03/2021   HCT 28.7 (L) 11/03/2021   MCV 72.7 (L) 11/03/2021    PLT 326 11/03/2021      Chemistry      Component Value Date/Time   NA 124 (L) 11/03/2021 1434   K 4.5 11/03/2021 1434   CL 87 (L) 11/03/2021 1434   CO2 26 11/03/2021 1434   BUN 25 (H) 11/03/2021 1434   BUN 5.4 (L) 11/09/2012 1033   CREATININE 2.29 (H) 11/03/2021 1434   CREATININE 0.8 11/09/2012 1033      Component Value Date/Time   CALCIUM 9.6 11/03/2021 1434   CALCIUM 6.9 (L) 09/24/2010 0500   ALKPHOS 69 11/03/2021 1434   AST 17 11/03/2021 1434   ALT 22 11/03/2021 1434   BILITOT 0.5 11/03/2021 1434       RADIOGRAPHIC STUDIES: CT Chest Wo Contrast  Result Date: 11/04/2021 CLINICAL DATA:  Primary Cancer Type: Lung Imaging Indication: Routine surveillance Interval therapy since last imaging? No Initial Cancer Diagnosis Date: 07/19/2019; Established by: Presumed Detailed Pathology: Presumed stage Ia non-small cell lung cancer. Primary Tumor location: Left upper lobe. Surgeries: Cholecystectomy. Chemotherapy: No Immunotherapy? No Radiation therapy? Yes; Date Range: 01/04/2020 - 01/11/2020; Target: Left lung Other Cancer Therapies: History of squamous cell cancer right tonsil with radiation therapy 2014.  History of colon cancer with resection 2018. * Tracking Code: BO * EXAM: CT CHEST WITHOUT CONTRAST TECHNIQUE: Multidetector CT imaging of the chest was performed following the standard protocol without IV contrast. RADIATION DOSE REDUCTION: This exam was performed according to the departmental dose-optimization program which includes automated exposure control, adjustment of the mA and/or kV according to patient size and/or use of iterative reconstruction technique. COMPARISON:  Most recent CT chest 05/06/2021.  03/03/2019 PET-CT. FINDINGS: Cardiovascular: Aortic atherosclerosis. Normal heart size, without pericardial effusion. Lad coronary artery calcification. Mediastinum/Nodes: No supraclavicular adenopathy. No mediastinal or definite hilar adenopathy, given limitations of unenhanced  CT. Lungs/Pleura: No pleural fluid.  Moderate centrilobular emphysema. Biapical pleuroparenchymal scarring, greater right than left. Right apical pulmonary nodule is similar at 8 mm on 23/5. Focal anterior right pleural thickening is unchanged on 49/5. Radiation fiducials are again identified within the inferior left upper lobe. The previously measured nodule is less distinct today, on the order of 7 mm on 83/5 versus 9 mm previously. Surrounding interstitial thickening is similar. Scattered tiny nodular densities and presumed post infectious/inflammatory scarring in the right lower lobe are unchanged. Upper Abdomen: Normal imaged portions of the liver, spleen, stomach, adrenal glands, kidneys. Dense abdominal aortic atherosclerosis. Pancreatic parenchymal calcifications. Musculoskeletal: Osteopenia. Fifth and sixth nonacute lateral left rib fractures. The fifth fracture is present on the prior exam and the sixth fracture is minimally displaced,new. IMPRESSION: 1. Further response to therapy of inferior left upper lobe pulmonary nodule. 2. No evidence of metastatic disease. 3. Similar right upper lobe pulmonary nodule. 4. Aortic atherosclerosis (ICD10-I70.0), coronary artery atherosclerosis and emphysema (ICD10-J43.9). 5. Chronic calcific pancreatitis. 6. Chronic lateral left fifth and new lateral left sixth rib fractures. Electronically Signed   By: Abigail Miyamoto M.D.   On: 11/04/2021 11:52    ASSESSMENT AND PLAN: This is a very pleasant 76 years old African-American female with the following medical problems. Stage II poorly differentiated adenocarcinoma of the ascending colon (T3N0), status post a right colectomy 10/30/2016 MSI-high, loss of MLH1 and PMS2 MLH1 methylation detected Surveillance CT [facility] at [facility] on 02/02/2017-negative for recurrent colon cancer Surveillance colonoscopy 10/04/2017-patent end-to-side ileocolonic anastomosis; diverticulosis in the sigmoid colon; examination otherwise normal.  Next  colonoscopy scheduled to be done in April 2023.   2.   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  Presumed stage Ia non-small cell lung cancer with a nodule in the left upper lobe status post SBRT under the care of Dr. Lisbeth Renshaw.  The biopsy showed atypia but no confirmed malignancy.  The patient is currently on observation and she is feeling fine. She had repeat CT scan of the chest performed recently.  I personally and independently reviewed the scans and discussed the result with the patient today. Her scan showed no concerning findings for disease progression or metastasis. I recommended for her to continue on observation with repeat CT scan of the chest without contrast in 6 months. 4.  For the renal insufficiency this is likely drug-induced from the multiple pain medication she has been using recently especially NSAIDs.  I strongly encouraged the patient to quit taking any NSAIDs for now and to consult with her primary care physician for close monitoring of her renal function and referral to nephrology if needed. The patient was advised to call immediately if she has any other concerning symptoms in the interval.  The patient voices understanding of current disease status and treatment options and is in agreement with the current care  plan.  All questions were answered. The patient knows to call the clinic with any problems, questions or concerns. We can certainly see the patient much sooner if necessary.  Disclaimer: This note was dictated with voice recognition software. Similar sounding words can inadvertently be transcribed and may not be corrected upon review.

## 2021-11-07 ENCOUNTER — Ambulatory Visit
Admission: RE | Admit: 2021-11-07 | Discharge: 2021-11-07 | Disposition: A | Discharge status: Home / Self Care | Payer: Medicare Other | Source: Ambulatory Visit | Attending: Geriatric Medicine | Admitting: Geriatric Medicine

## 2021-11-07 DIAGNOSIS — M5441 Lumbago with sciatica, right side: Secondary | ICD-10-CM

## 2021-11-07 DIAGNOSIS — M545 Low back pain, unspecified: Secondary | ICD-10-CM | POA: Diagnosis not present

## 2021-11-07 MED ORDER — METHYLPREDNISOLONE ACETATE 40 MG/ML INJ SUSP (RADIOLOG: 80.0000 mg | Freq: Once | INTRAMUSCULAR | Status: DC

## 2021-11-07 MED ORDER — IOPAMIDOL (ISOVUE-M 300) INJECTION 61%: 1.0000 mL | Freq: Once | INTRAMUSCULAR | Status: DC | PRN

## 2021-11-07 NOTE — Discharge Instructions (Addendum)
Post Procedure Spinal Discharge Instruction Sheet  You may resume a regular diet and any medications that you routinely take (including pain medications) unless otherwise noted by MD.  No driving day of procedure.  Light activity throughout the rest of the day.  Do not do any strenuous work, exercise, bending or lifting.  The day following the procedure, you can resume normal physical activity but you should refrain from exercising or physical therapy for at least three days thereafter.  You may apply ice to the injection site, 20 minutes on, 20 minutes off, as needed. Do not apply ice directly to skin.    Common Side Effects:  Headaches- take your usual medications as directed by your physician.  Increase your fluid intake.  Caffeinated beverages may be helpful.  Lie flat in bed until your headache resolves.  Restlessness or inability to sleep- you may have trouble sleeping for the next few days.  Ask your referring physician if you need any medication for sleep.  Facial flushing or redness- should subside within a few days.  Increased pain- a temporary increase in pain a day or two following your procedure is not unusual.  Take your pain medication as prescribed by your referring physician.  Leg cramps  Please contact our office at [phone] for the following symptoms: Fever greater than 100 degrees. Headaches unresolved with medication after 2-3 days. Increased swelling, pain, or redness at injection site.   Thank you for visiting [facility] today.    May resume aspirin immediately after procedure.

## 2021-11-11 DIAGNOSIS — Z08 Encounter for follow-up examination after completed treatment for malignant neoplasm: Secondary | ICD-10-CM | POA: Diagnosis not present

## 2021-11-11 DIAGNOSIS — Z980 Intestinal bypass and anastomosis status: Secondary | ICD-10-CM | POA: Diagnosis not present

## 2021-11-11 DIAGNOSIS — D124 Benign neoplasm of descending colon: Secondary | ICD-10-CM | POA: Diagnosis not present

## 2021-11-11 DIAGNOSIS — Z85038 Personal history of other malignant neoplasm of large intestine: Secondary | ICD-10-CM | POA: Diagnosis not present

## 2021-11-11 DIAGNOSIS — K5730 Diverticulosis of large intestine without perforation or abscess without bleeding: Secondary | ICD-10-CM | POA: Diagnosis not present

## 2021-11-13 DIAGNOSIS — D124 Benign neoplasm of descending colon: Secondary | ICD-10-CM | POA: Diagnosis not present

## 2021-11-14 DIAGNOSIS — Z79899 Other long term (current) drug therapy: Secondary | ICD-10-CM | POA: Diagnosis not present

## 2021-11-17 ENCOUNTER — Other Ambulatory Visit: Payer: Medicare Other

## 2021-11-19 ENCOUNTER — Other Ambulatory Visit: Payer: Self-pay | Admitting: Geriatric Medicine

## 2021-11-19 DIAGNOSIS — M5441 Lumbago with sciatica, right side: Secondary | ICD-10-CM

## 2021-11-24 ENCOUNTER — Other Ambulatory Visit: Payer: Medicare Other

## 2021-11-25 ENCOUNTER — Other Ambulatory Visit: Payer: Self-pay | Admitting: Geriatric Medicine

## 2021-11-25 ENCOUNTER — Ambulatory Visit
Admission: RE | Admit: 2021-11-25 | Discharge: 2021-11-25 | Disposition: A | Discharge status: Home / Self Care | Payer: Medicare Other | Source: Ambulatory Visit | Attending: Geriatric Medicine | Admitting: Geriatric Medicine

## 2021-11-25 DIAGNOSIS — M5441 Lumbago with sciatica, right side: Secondary | ICD-10-CM

## 2021-11-25 DIAGNOSIS — M5116 Intervertebral disc disorders with radiculopathy, lumbar region: Secondary | ICD-10-CM | POA: Diagnosis not present

## 2021-11-25 DIAGNOSIS — M47817 Spondylosis without myelopathy or radiculopathy, lumbosacral region: Secondary | ICD-10-CM | POA: Diagnosis not present

## 2021-11-25 MED ORDER — METHYLPREDNISOLONE ACETATE 40 MG/ML INJ SUSP (RADIOLOG
80.0000 mg | Freq: Once | INTRAMUSCULAR | Status: AC
  Administered 2021-11-25: 80 mg via EPIDURAL

## 2021-11-25 MED ORDER — IOPAMIDOL (ISOVUE-M 200) INJECTION 41%
1.0000 mL | Freq: Once | INTRAMUSCULAR | Status: AC
  Administered 2021-11-25: 1 mL via EPIDURAL

## 2021-11-25 NOTE — Discharge Instructions (Signed)

## 2021-12-08 DIAGNOSIS — N1831 Chronic kidney disease, stage 3a: Secondary | ICD-10-CM | POA: Diagnosis not present

## 2021-12-10 DIAGNOSIS — N178 Other acute kidney failure: Secondary | ICD-10-CM | POA: Diagnosis not present

## 2021-12-10 DIAGNOSIS — N1330 Unspecified hydronephrosis: Secondary | ICD-10-CM | POA: Diagnosis not present

## 2021-12-10 DIAGNOSIS — N179 Acute kidney failure, unspecified: Secondary | ICD-10-CM | POA: Diagnosis not present

## 2021-12-15 DIAGNOSIS — N1831 Chronic kidney disease, stage 3a: Secondary | ICD-10-CM | POA: Diagnosis not present

## 2021-12-25 DIAGNOSIS — I129 Hypertensive chronic kidney disease with stage 1 through stage 4 chronic kidney disease, or unspecified chronic kidney disease: Secondary | ICD-10-CM | POA: Diagnosis not present

## 2021-12-25 DIAGNOSIS — C3490 Malignant neoplasm of unspecified part of unspecified bronchus or lung: Secondary | ICD-10-CM | POA: Diagnosis not present

## 2021-12-25 DIAGNOSIS — N179 Acute kidney failure, unspecified: Secondary | ICD-10-CM | POA: Diagnosis not present

## 2021-12-25 DIAGNOSIS — E1122 Type 2 diabetes mellitus with diabetic chronic kidney disease: Secondary | ICD-10-CM | POA: Diagnosis not present

## 2021-12-25 DIAGNOSIS — E871 Hypo-osmolality and hyponatremia: Secondary | ICD-10-CM | POA: Diagnosis not present

## 2021-12-25 DIAGNOSIS — N1831 Chronic kidney disease, stage 3a: Secondary | ICD-10-CM | POA: Diagnosis not present

## 2022-01-01 DIAGNOSIS — N184 Chronic kidney disease, stage 4 (severe): Secondary | ICD-10-CM | POA: Diagnosis not present

## 2022-01-07 DIAGNOSIS — E871 Hypo-osmolality and hyponatremia: Secondary | ICD-10-CM | POA: Diagnosis not present

## 2022-01-07 DIAGNOSIS — N189 Chronic kidney disease, unspecified: Secondary | ICD-10-CM | POA: Diagnosis not present

## 2022-01-12 DIAGNOSIS — C3490 Malignant neoplasm of unspecified part of unspecified bronchus or lung: Secondary | ICD-10-CM | POA: Diagnosis not present

## 2022-01-12 DIAGNOSIS — N179 Acute kidney failure, unspecified: Secondary | ICD-10-CM | POA: Diagnosis not present

## 2022-01-12 DIAGNOSIS — N1330 Unspecified hydronephrosis: Secondary | ICD-10-CM | POA: Diagnosis not present

## 2022-01-12 DIAGNOSIS — E871 Hypo-osmolality and hyponatremia: Secondary | ICD-10-CM | POA: Diagnosis not present

## 2022-01-12 DIAGNOSIS — Z23 Encounter for immunization: Secondary | ICD-10-CM | POA: Diagnosis not present

## 2022-01-12 DIAGNOSIS — D509 Iron deficiency anemia, unspecified: Secondary | ICD-10-CM | POA: Diagnosis not present

## 2022-01-12 DIAGNOSIS — N1831 Chronic kidney disease, stage 3a: Secondary | ICD-10-CM | POA: Diagnosis not present

## 2022-01-12 DIAGNOSIS — E1122 Type 2 diabetes mellitus with diabetic chronic kidney disease: Secondary | ICD-10-CM | POA: Diagnosis not present

## 2022-01-12 DIAGNOSIS — I129 Hypertensive chronic kidney disease with stage 1 through stage 4 chronic kidney disease, or unspecified chronic kidney disease: Secondary | ICD-10-CM | POA: Diagnosis not present

## 2022-01-25 DIAGNOSIS — Z23 Encounter for immunization: Secondary | ICD-10-CM | POA: Diagnosis not present

## 2022-01-27 DIAGNOSIS — E871 Hypo-osmolality and hyponatremia: Secondary | ICD-10-CM | POA: Diagnosis not present

## 2022-02-11 DIAGNOSIS — R634 Abnormal weight loss: Secondary | ICD-10-CM | POA: Diagnosis not present

## 2022-02-11 DIAGNOSIS — I129 Hypertensive chronic kidney disease with stage 1 through stage 4 chronic kidney disease, or unspecified chronic kidney disease: Secondary | ICD-10-CM | POA: Diagnosis not present

## 2022-02-11 DIAGNOSIS — N184 Chronic kidney disease, stage 4 (severe): Secondary | ICD-10-CM | POA: Diagnosis not present

## 2022-02-11 DIAGNOSIS — E1169 Type 2 diabetes mellitus with other specified complication: Secondary | ICD-10-CM | POA: Diagnosis not present

## 2022-02-12 DIAGNOSIS — Z1231 Encounter for screening mammogram for malignant neoplasm of breast: Secondary | ICD-10-CM | POA: Diagnosis not present

## 2022-02-18 DIAGNOSIS — N179 Acute kidney failure, unspecified: Secondary | ICD-10-CM | POA: Diagnosis not present

## 2022-02-26 DIAGNOSIS — N1330 Unspecified hydronephrosis: Secondary | ICD-10-CM | POA: Diagnosis not present

## 2022-02-26 DIAGNOSIS — D509 Iron deficiency anemia, unspecified: Secondary | ICD-10-CM | POA: Diagnosis not present

## 2022-02-26 DIAGNOSIS — E871 Hypo-osmolality and hyponatremia: Secondary | ICD-10-CM | POA: Diagnosis not present

## 2022-02-26 DIAGNOSIS — N1832 Chronic kidney disease, stage 3b: Secondary | ICD-10-CM | POA: Diagnosis not present

## 2022-02-26 DIAGNOSIS — C3490 Malignant neoplasm of unspecified part of unspecified bronchus or lung: Secondary | ICD-10-CM | POA: Diagnosis not present

## 2022-02-26 DIAGNOSIS — E1122 Type 2 diabetes mellitus with diabetic chronic kidney disease: Secondary | ICD-10-CM | POA: Diagnosis not present

## 2022-02-26 DIAGNOSIS — N179 Acute kidney failure, unspecified: Secondary | ICD-10-CM | POA: Diagnosis not present

## 2022-02-26 DIAGNOSIS — I129 Hypertensive chronic kidney disease with stage 1 through stage 4 chronic kidney disease, or unspecified chronic kidney disease: Secondary | ICD-10-CM | POA: Diagnosis not present

## 2022-03-31 DIAGNOSIS — N130 Hydronephrosis with ureteropelvic junction obstruction: Secondary | ICD-10-CM | POA: Diagnosis not present

## 2022-04-13 DIAGNOSIS — N130 Hydronephrosis with ureteropelvic junction obstruction: Secondary | ICD-10-CM | POA: Diagnosis not present

## 2022-04-13 DIAGNOSIS — N1339 Other hydronephrosis: Secondary | ICD-10-CM | POA: Diagnosis not present

## 2022-05-07 ENCOUNTER — Inpatient Hospital Stay: Payer: Medicare Other | Attending: Internal Medicine

## 2022-05-07 ENCOUNTER — Ambulatory Visit (HOSPITAL_COMMUNITY)
Admission: RE | Admit: 2022-05-07 | Discharge: 2022-05-07 | Disposition: A | Discharge status: Home / Self Care | Payer: Medicare Other | Source: Ambulatory Visit | Attending: Internal Medicine | Admitting: Internal Medicine

## 2022-05-07 DIAGNOSIS — C3490 Malignant neoplasm of unspecified part of unspecified bronchus or lung: Secondary | ICD-10-CM

## 2022-05-07 DIAGNOSIS — Z923 Personal history of irradiation: Secondary | ICD-10-CM | POA: Insufficient documentation

## 2022-05-07 DIAGNOSIS — N289 Disorder of kidney and ureter, unspecified: Secondary | ICD-10-CM | POA: Insufficient documentation

## 2022-05-07 DIAGNOSIS — E1136 Type 2 diabetes mellitus with diabetic cataract: Secondary | ICD-10-CM | POA: Insufficient documentation

## 2022-05-07 DIAGNOSIS — C4492 Squamous cell carcinoma of skin, unspecified: Secondary | ICD-10-CM | POA: Diagnosis not present

## 2022-05-07 DIAGNOSIS — C3491 Malignant neoplasm of unspecified part of right bronchus or lung: Secondary | ICD-10-CM | POA: Diagnosis not present

## 2022-05-07 DIAGNOSIS — Z85038 Personal history of other malignant neoplasm of large intestine: Secondary | ICD-10-CM | POA: Insufficient documentation

## 2022-05-07 DIAGNOSIS — J439 Emphysema, unspecified: Secondary | ICD-10-CM | POA: Diagnosis not present

## 2022-05-07 DIAGNOSIS — Z85818 Personal history of malignant neoplasm of other sites of lip, oral cavity, and pharynx: Secondary | ICD-10-CM | POA: Insufficient documentation

## 2022-05-07 DIAGNOSIS — Z85118 Personal history of other malignant neoplasm of bronchus and lung: Secondary | ICD-10-CM | POA: Insufficient documentation

## 2022-05-07 DIAGNOSIS — J479 Bronchiectasis, uncomplicated: Secondary | ICD-10-CM | POA: Diagnosis not present

## 2022-05-07 DIAGNOSIS — I10 Essential (primary) hypertension: Secondary | ICD-10-CM | POA: Insufficient documentation

## 2022-05-07 LAB — CBC WITH DIFFERENTIAL (CANCER CENTER ONLY)
Abs Immature Granulocytes: 0.03 10*3/uL (ref 0.00–0.07)
Basophils Absolute: 0 10*3/uL (ref 0.0–0.1)
Basophils Relative: 1 %
Eosinophils Absolute: 0 10*3/uL (ref 0.0–0.5)
Eosinophils Relative: 1 %
HCT: 27.9 % — ABNORMAL LOW (ref 36.0–46.0)
Hemoglobin: 9.8 g/dL — ABNORMAL LOW (ref 12.0–15.0)
Immature Granulocytes: 1 %
Lymphocytes Relative: 18 %
Lymphs Abs: 1.1 10*3/uL (ref 0.7–4.0)
MCH: 26.6 pg (ref 26.0–34.0)
MCHC: 35.1 g/dL (ref 30.0–36.0)
MCV: 75.6 fL — ABNORMAL LOW (ref 80.0–100.0)
Monocytes Absolute: 0.5 10*3/uL (ref 0.1–1.0)
Monocytes Relative: 9 %
Neutro Abs: 4.4 10*3/uL (ref 1.7–7.7)
Neutrophils Relative %: 70 %
Platelet Count: 259 10*3/uL (ref 150–400)
RBC: 3.69 MIL/uL — ABNORMAL LOW (ref 3.87–5.11)
RDW: 13.4 % (ref 11.5–15.5)
WBC Count: 6.1 10*3/uL (ref 4.0–10.5)
nRBC: 0 % (ref 0.0–0.2)

## 2022-05-07 LAB — CMP (CANCER CENTER ONLY)
ALT: 14 U/L (ref 0–44)
AST: 18 U/L (ref 15–41)
Albumin: 4.7 g/dL (ref 3.5–5.0)
Alkaline Phosphatase: 74 U/L (ref 38–126)
Anion gap: 8 (ref 5–15)
BUN: 18 mg/dL (ref 8–23)
CO2: 28 mmol/L (ref 22–32)
Calcium: 10.6 mg/dL — ABNORMAL HIGH (ref 8.9–10.3)
Chloride: 96 mmol/L — ABNORMAL LOW (ref 98–111)
Creatinine: 1.5 mg/dL — ABNORMAL HIGH (ref 0.44–1.00)
GFR, Estimated: 36 mL/min — ABNORMAL LOW (ref >60)
Glucose, Bld: 113 mg/dL — ABNORMAL HIGH (ref 70–99)
Potassium: 5 mmol/L (ref 3.5–5.1)
Sodium: 132 mmol/L — ABNORMAL LOW (ref 135–145)
Total Bilirubin: 0.4 mg/dL (ref 0.3–1.2)
Total Protein: 7.7 g/dL (ref 6.5–8.1)

## 2022-05-11 ENCOUNTER — Inpatient Hospital Stay (HOSPITAL_BASED_OUTPATIENT_CLINIC_OR_DEPARTMENT_OTHER): Payer: Medicare Other | Admitting: Internal Medicine

## 2022-05-11 ENCOUNTER — Other Ambulatory Visit: Payer: Self-pay

## 2022-05-11 VITALS — BP 145/73 | HR 99 | Temp 97.9°F | Resp 16 | Wt 97.5 lb

## 2022-05-11 DIAGNOSIS — Z85818 Personal history of malignant neoplasm of other sites of lip, oral cavity, and pharynx: Secondary | ICD-10-CM | POA: Diagnosis not present

## 2022-05-11 DIAGNOSIS — I10 Essential (primary) hypertension: Secondary | ICD-10-CM | POA: Diagnosis not present

## 2022-05-11 DIAGNOSIS — N289 Disorder of kidney and ureter, unspecified: Secondary | ICD-10-CM | POA: Diagnosis not present

## 2022-05-11 DIAGNOSIS — Z85118 Personal history of other malignant neoplasm of bronchus and lung: Secondary | ICD-10-CM | POA: Diagnosis not present

## 2022-05-11 DIAGNOSIS — C3490 Malignant neoplasm of unspecified part of unspecified bronchus or lung: Secondary | ICD-10-CM | POA: Diagnosis not present

## 2022-05-11 DIAGNOSIS — E1136 Type 2 diabetes mellitus with diabetic cataract: Secondary | ICD-10-CM | POA: Diagnosis not present

## 2022-05-11 DIAGNOSIS — Z85038 Personal history of other malignant neoplasm of large intestine: Secondary | ICD-10-CM | POA: Diagnosis not present

## 2022-05-11 DIAGNOSIS — Z923 Personal history of irradiation: Secondary | ICD-10-CM | POA: Diagnosis not present

## 2022-05-11 NOTE — Progress Notes (Signed)
[facility] Telephone:([phone]   Fax:([phone]  OFFICE PROGRESS NOTE  Emilio Aspen, MD [address] [place] [address]  DIAGNOSIS:  Stage II poorly differentiated adenocarcinoma of the ascending colon (T3N0), status post a right colectomy 10/30/2016 MSI-high, loss of MLH1 and PMS2 MLH1 methylation detected Surveillance CT scans at [facility] on 02/02/2017-negative for recurrent colon cancer Surveillance colonoscopy 10/04/2017-patent end-to-side ileocolonic anastomosis; diverticulosis in the sigmoid colon; examination otherwise normal.  Next colonoscopy 3 years   2.   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  Presumed stage Ia non-small cell lung cancer with a nodule in the left upper lobe.   PRIOR THERAPY: Status post SBRT under the care of Dr. Mitzi Hansen.  The biopsy showed atypia but no confirmed malignancy.  CURRENT THERAPY: Observation.   INTERVAL HISTORY: Courtney Bowen 77 y.o. female returns to the clinic today for follow-up visit.  The patient is feeling fine today with no concerning complaints.  She denied having any current chest pain, shortness of breath, cough or hemoptysis.  She has no nausea, vomiting, diarrhea or constipation.  She has no recent weight loss or night sweats.  She has no headache or visual changes.  She is here today for evaluation with repeat CT scan of the chest for restaging of her disease.   MEDICAL HISTORY: Past Medical History:  Diagnosis Date   Cancer ([facility]) 07/08/12   right tonsil   Cataract    B/L   Colon cancer ([facility]) dx'd 2016   COPD (chronic obstructive pulmonary disease) (HCC)    Diabetes mellitus without complication (HCC)    GERD (gastroesophageal reflux disease)    Gout    Hypercholesterolemia    Hypertension    not on any meds   Lung nodule    Pneumonia    Renal insufficiency    history of   Squamous cell carcinoma of tonsil ([facility]) 07/08/12    right   Status post radiation therapy within last four weeks 08/17/11-10/07/12   squamous cell of tonsil   Submandibular sialolithiasis    right   Wears glasses     ALLERGIES:  is allergic to amoxicillin and erythromycin.  MEDICATIONS:  Current Outpatient Medications  Medication Sig Dispense Refill   alendronate (FOSAMAX) 70 MG tablet Take 70 mg by mouth once a week.     amLODipine (NORVASC) 5 MG tablet Take 5 mg by mouth daily.     aspirin EC 81 MG tablet Take 81 mg by mouth daily.      atorvastatin (LIPITOR) 20 MG tablet Take 20 mg by mouth daily.     cholecalciferol (VITAMIN D) 400 UNITS TABS Take 400 Units by mouth daily.      docusate sodium (COLACE) 100 MG capsule Take 100 mg by mouth in the morning and at bedtime.     losartan (COZAAR) 50 MG tablet Take 50 mg by mouth every evening     nicotine polacrilex (NICORETTE) 4 MG gum Take 4 mg by mouth as needed for smoking cessation.      omeprazole (PRILOSEC) 20 MG capsule Take 20 mg by mouth daily.     polyethylene glycol (MIRALAX / GLYCOLAX) packet Take 17 g by mouth daily as needed.      No current facility-administered medications for this visit.    SURGICAL HISTORY:  Past Surgical History:  Procedure Laterality Date   BREAST BIOPSY     hx of  CHOLECYSTECTOMY  20 yrs ago   laproscopic   COLONOSCOPY     FUDUCIAL PLACEMENT N/A 07/19/2019   Procedure: PLACEMENT OF FUDUCIAL;  Surgeon: Delight Ovens, MD;  Location: [facility] OR;  Service: Thoracic;  Laterality: N/A;   LUNG BIOPSY Left 07/19/2019   Procedure: LUNG BIOPSY;  Surgeon: Delight Ovens, MD;  Location: [facility] OR;  Service: Thoracic;  Laterality: Left;   TONSILLECTOMY     VIDEO BRONCHOSCOPY WITH ENDOBRONCHIAL NAVIGATION N/A 07/19/2019   Procedure: VIDEO BRONCHOSCOPY WITH ENDOBRONCHIAL NAVIGATION with Biopsy, Fuducial placement;  Surgeon: Delight Ovens, MD;  Location: [facility] OR;  Service: Thoracic;  Laterality: N/A;   WISDOM TOOTH EXTRACTION      REVIEW OF SYSTEMS:   Review of systems not obtained due to patient factors.   PHYSICAL EXAMINATION: General appearance: alert, cooperative, and no distress Head: Normocephalic, without obvious abnormality, atraumatic Neck: no adenopathy, no JVD, supple, symmetrical, trachea midline, and thyroid not enlarged, symmetric, no tenderness/mass/nodules Lymph nodes: Cervical, supraclavicular, and axillary nodes normal. Resp: clear to auscultation bilaterally Back: symmetric, no curvature. ROM normal. No CVA tenderness. Cardio: regular rate and rhythm, S1, S2 normal, no murmur, click, rub or gallop GI: soft, non-tender; bowel sounds normal; no masses,  no organomegaly Extremities: extremities normal, atraumatic, no cyanosis or edema  ECOG PERFORMANCE STATUS: 1 - Symptomatic but completely ambulatory  Blood pressure (!) 145/73, pulse 99, temperature 97.9 F (36.6 C), temperature source Oral, resp. rate 16, weight 97 lb 8 oz (44.2 kg), SpO2 100 %.  LABORATORY DATA: Lab Results  Component Value Date   WBC 6.1 05/07/2022   HGB 9.8 (L) 05/07/2022   HCT 27.9 (L) 05/07/2022   MCV 75.6 (L) 05/07/2022   PLT 259 05/07/2022      Chemistry      Component Value Date/Time   NA 132 (L) 05/07/2022 1009   K 5.0 05/07/2022 1009   CL 96 (L) 05/07/2022 1009   CO2 28 05/07/2022 1009   BUN 18 05/07/2022 1009   BUN 5.4 (L) 11/09/2012 1033   CREATININE 1.50 (H) 05/07/2022 1009   CREATININE 0.8 11/09/2012 1033      Component Value Date/Time   CALCIUM 10.6 (H) 05/07/2022 1009   CALCIUM 6.9 (L) 09/24/2010 0500   ALKPHOS 74 05/07/2022 1009   AST 18 05/07/2022 1009   ALT 14 05/07/2022 1009   BILITOT 0.4 05/07/2022 1009       RADIOGRAPHIC STUDIES: CT Chest Wo Contrast  Result Date: 05/07/2022 CLINICAL DATA:  Staging right-sided non-small-cell lung cancer. Squamous cell carcinoma. Also history of colon cancer. Weight loss. * Tracking Code: BO * EXAM: CT CHEST WITHOUT CONTRAST TECHNIQUE: Multidetector CT imaging of the chest  was performed following the standard protocol without IV contrast. RADIATION DOSE REDUCTION: This exam was performed according to the departmental dose-optimization program which includes automated exposure control, adjustment of the mA and/or kV according to patient size and/or use of iterative reconstruction technique. COMPARISON:  CT 11/03/2021 and older FINDINGS: Cardiovascular: The heart is nonenlarged. No pericardial effusion. Normal caliber thoracic aorta with scattered vascular calcifications on this noncontrast examination. Coronary artery calcifications are seen. Mediastinum/Nodes: On this non IV contrast exam there is no specific abnormal lymph node enlargement present in the axillary regions. Evaluation of the hila are limited without IV contrast but no obvious lymph nodes. There are some small nodes in the mediastinum which are more numerous than usually seen but not pathologic by size criteria and unchanged from prior. Normal caliber thoracic esophagus. There  is some atrophy of the thyroid gland. Lungs/Pleura: Severe centrilobular emphysematous lung changes are identified. Bilateral apical pleural thickening, scarring and fibrotic changes are identified. Scattered areas of pleural thickening elsewhere as well. Specific lesions will be seen. This includes right apical nodule measuring 8 mm on the prior, stable today on series 5, image 25. Focal pleural thickening more caudal anterior along the right hemithorax on image 47 of series 5. Fiducial marker is seen along the lingula with some adjacent bandlike opacities, pleural thickening and interstitial thickening. The nodule seen previously measuring 7 mm near the fiducial markers is stable on series 5, image 84. No new dominant nodule identified. There is lower lobe left-sided areas of bronchiectasis and bandlike scarring or atelectatic change as well. No new consolidation, pneumothorax or effusion. Upper Abdomen: Stable thickening of the adrenal glands.  The pancreas is atrophic with several dystrophic calcifications and severe ductal dilatation. Unchanged from previous. Please correlate for any known history including chronic calcific pancreatitis. Musculoskeletal: Mild degenerative changes along the spine. Left-sided rib fractures are again seen and unchanged. Fifth and sixth ribs. IMPRESSION: Essentially no interval change in the appearance of the lungs. Posttreatment changes scattered in the lingula with some subtle residual nodularity stable. Multiple areas of scarring, fibrotic change and pleural thickening. Advanced emphysematous lung changes. Again changes of chronic calcific pancreatitis. Stable left-sided rib fractures. Aortic Atherosclerosis (ICD10-I70.0) and Emphysema (ICD10-J43.9). Electronically Signed   By: Karen Kays M.D.   On: 05/07/2022 17:37    ASSESSMENT AND PLAN: This is a very pleasant 77 years old African-American female with the following medical problems. Stage II poorly differentiated adenocarcinoma of the ascending colon (T3N0), status post a right colectomy 10/30/2016 MSI-high, loss of MLH1 and PMS2 MLH1 methylation detected Surveillance CT scans at [facility] on 02/02/2017-negative for recurrent colon cancer Surveillance colonoscopy 10/04/2017-patent end-to-side ileocolonic anastomosis; diverticulosis in the sigmoid colon; examination otherwise normal.  Next colonoscopy scheduled to be done in April 2023.   2.   Right tonsil cancer (T2N0), status post primary radiation completed 09/27/2012  3.  Presumed stage Ia non-small cell lung cancer with a nodule in the left upper lobe status post SBRT under the care of Dr. Mitzi Hansen.  The biopsy showed atypia but no confirmed malignancy.   The patient is currently on observation and she is feeling fine with no concerning complaints. She had repeat CT scan of the chest performed recently.  I personally and independently reviewed the scan and discussed the result with the patient today. Her scan  showed no concerning findings for disease recurrence or metastasis. I recommended for her to continue on observation with repeat CT scan of the chest in 6 months.  4.  For the renal insufficiency this is likely drug-induced from the multiple pain medication she has been using recently especially NSAIDs.  She is followed by nephrology. She was advised to call immediately if she has any other concerning symptoms in the interval.   The patient voices understanding of current disease status and treatment options and is in agreement with the current care plan.  All questions were answered. The patient knows to call the clinic with any problems, questions or concerns. We can certainly see the patient much sooner if necessary.  Disclaimer: This note was dictated with voice recognition software. Similar sounding words can inadvertently be transcribed and may not be corrected upon review.

## 2022-05-14 DIAGNOSIS — I129 Hypertensive chronic kidney disease with stage 1 through stage 4 chronic kidney disease, or unspecified chronic kidney disease: Secondary | ICD-10-CM | POA: Diagnosis not present

## 2022-05-14 DIAGNOSIS — Z85038 Personal history of other malignant neoplasm of large intestine: Secondary | ICD-10-CM | POA: Diagnosis not present

## 2022-05-14 DIAGNOSIS — N184 Chronic kidney disease, stage 4 (severe): Secondary | ICD-10-CM | POA: Diagnosis not present

## 2022-05-14 DIAGNOSIS — E1169 Type 2 diabetes mellitus with other specified complication: Secondary | ICD-10-CM | POA: Diagnosis not present

## 2022-05-14 DIAGNOSIS — E441 Mild protein-calorie malnutrition: Secondary | ICD-10-CM | POA: Diagnosis not present

## 2022-05-14 DIAGNOSIS — R682 Dry mouth, unspecified: Secondary | ICD-10-CM | POA: Diagnosis not present

## 2022-05-14 DIAGNOSIS — Z8589 Personal history of malignant neoplasm of other organs and systems: Secondary | ICD-10-CM | POA: Diagnosis not present

## 2022-05-14 DIAGNOSIS — R634 Abnormal weight loss: Secondary | ICD-10-CM | POA: Diagnosis not present

## 2022-05-14 DIAGNOSIS — I10 Essential (primary) hypertension: Secondary | ICD-10-CM | POA: Diagnosis not present

## 2022-05-14 DIAGNOSIS — R351 Nocturia: Secondary | ICD-10-CM | POA: Diagnosis not present

## 2022-05-14 DIAGNOSIS — R911 Solitary pulmonary nodule: Secondary | ICD-10-CM | POA: Diagnosis not present

## 2022-08-25 DIAGNOSIS — E871 Hypo-osmolality and hyponatremia: Secondary | ICD-10-CM | POA: Diagnosis not present

## 2022-08-25 DIAGNOSIS — E559 Vitamin D deficiency, unspecified: Secondary | ICD-10-CM | POA: Diagnosis not present

## 2022-08-25 DIAGNOSIS — N1831 Chronic kidney disease, stage 3a: Secondary | ICD-10-CM | POA: Diagnosis not present

## 2022-09-02 DIAGNOSIS — C3490 Malignant neoplasm of unspecified part of unspecified bronchus or lung: Secondary | ICD-10-CM | POA: Diagnosis not present

## 2022-09-02 DIAGNOSIS — N1330 Unspecified hydronephrosis: Secondary | ICD-10-CM | POA: Diagnosis not present

## 2022-09-02 DIAGNOSIS — N1832 Chronic kidney disease, stage 3b: Secondary | ICD-10-CM | POA: Diagnosis not present

## 2022-09-02 DIAGNOSIS — D509 Iron deficiency anemia, unspecified: Secondary | ICD-10-CM | POA: Diagnosis not present

## 2022-09-02 DIAGNOSIS — E871 Hypo-osmolality and hyponatremia: Secondary | ICD-10-CM | POA: Diagnosis not present

## 2022-09-02 DIAGNOSIS — E1122 Type 2 diabetes mellitus with diabetic chronic kidney disease: Secondary | ICD-10-CM | POA: Diagnosis not present

## 2022-09-02 DIAGNOSIS — I129 Hypertensive chronic kidney disease with stage 1 through stage 4 chronic kidney disease, or unspecified chronic kidney disease: Secondary | ICD-10-CM | POA: Diagnosis not present

## 2022-11-05 ENCOUNTER — Inpatient Hospital Stay: Payer: Medicare Other | Attending: Internal Medicine

## 2022-11-05 ENCOUNTER — Ambulatory Visit (HOSPITAL_COMMUNITY)
Admission: RE | Admit: 2022-11-05 | Discharge: 2022-11-05 | Disposition: A | Discharge status: Home / Self Care | Payer: Medicare Other | Source: Ambulatory Visit | Attending: Internal Medicine | Admitting: Internal Medicine

## 2022-11-05 ENCOUNTER — Other Ambulatory Visit: Payer: Self-pay

## 2022-11-05 DIAGNOSIS — Z85038 Personal history of other malignant neoplasm of large intestine: Secondary | ICD-10-CM | POA: Insufficient documentation

## 2022-11-05 DIAGNOSIS — K861 Other chronic pancreatitis: Secondary | ICD-10-CM | POA: Diagnosis not present

## 2022-11-05 DIAGNOSIS — Z923 Personal history of irradiation: Secondary | ICD-10-CM | POA: Insufficient documentation

## 2022-11-05 DIAGNOSIS — C3490 Malignant neoplasm of unspecified part of unspecified bronchus or lung: Secondary | ICD-10-CM | POA: Diagnosis not present

## 2022-11-05 DIAGNOSIS — N289 Disorder of kidney and ureter, unspecified: Secondary | ICD-10-CM | POA: Insufficient documentation

## 2022-11-05 DIAGNOSIS — H5212 Myopia, left eye: Secondary | ICD-10-CM | POA: Diagnosis not present

## 2022-11-05 DIAGNOSIS — Z85818 Personal history of malignant neoplasm of other sites of lip, oral cavity, and pharynx: Secondary | ICD-10-CM | POA: Insufficient documentation

## 2022-11-05 DIAGNOSIS — R911 Solitary pulmonary nodule: Secondary | ICD-10-CM | POA: Insufficient documentation

## 2022-11-05 DIAGNOSIS — I700 Atherosclerosis of aorta: Secondary | ICD-10-CM | POA: Diagnosis not present

## 2022-11-05 DIAGNOSIS — H2512 Age-related nuclear cataract, left eye: Secondary | ICD-10-CM | POA: Diagnosis not present

## 2022-11-05 LAB — CBC WITH DIFFERENTIAL (CANCER CENTER ONLY)
Abs Immature Granulocytes: 0.02 K/uL (ref 0.00–0.07)
Basophils Absolute: 0 K/uL (ref 0.0–0.1)
Basophils Relative: 1 %
Eosinophils Absolute: 0.1 K/uL (ref 0.0–0.5)
Eosinophils Relative: 1 %
HCT: 30.9 % — ABNORMAL LOW (ref 36.0–46.0)
Hemoglobin: 11.1 g/dL — ABNORMAL LOW (ref 12.0–15.0)
Immature Granulocytes: 0 %
Lymphocytes Relative: 26 %
Lymphs Abs: 1.4 K/uL (ref 0.7–4.0)
MCH: 26.9 pg (ref 26.0–34.0)
MCHC: 35.9 g/dL (ref 30.0–36.0)
MCV: 74.8 fL — ABNORMAL LOW (ref 80.0–100.0)
Monocytes Absolute: 0.5 K/uL (ref 0.1–1.0)
Monocytes Relative: 10 %
Neutro Abs: 3.3 K/uL (ref 1.7–7.7)
Neutrophils Relative %: 62 %
Platelet Count: 263 K/uL (ref 150–400)
RBC: 4.13 MIL/uL (ref 3.87–5.11)
RDW: 14.9 % (ref 11.5–15.5)
WBC Count: 5.3 K/uL (ref 4.0–10.5)
nRBC: 0 % (ref 0.0–0.2)

## 2022-11-05 LAB — CMP (CANCER CENTER ONLY)
ALT: 12 U/L (ref 0–44)
AST: 15 U/L (ref 15–41)
Albumin: 4.4 g/dL (ref 3.5–5.0)
Alkaline Phosphatase: 81 U/L (ref 38–126)
Anion gap: 9 (ref 5–15)
BUN: 16 mg/dL (ref 8–23)
CO2: 27 mmol/L (ref 22–32)
Calcium: 9.9 mg/dL (ref 8.9–10.3)
Chloride: 95 mmol/L — ABNORMAL LOW (ref 98–111)
Creatinine: 1.35 mg/dL — ABNORMAL HIGH (ref 0.44–1.00)
GFR, Estimated: 41 mL/min — ABNORMAL LOW (ref >60)
Glucose, Bld: 121 mg/dL — ABNORMAL HIGH (ref 70–99)
Potassium: 4.5 mmol/L (ref 3.5–5.1)
Sodium: 131 mmol/L — ABNORMAL LOW (ref 135–145)
Total Bilirubin: 0.3 mg/dL (ref 0.3–1.2)
Total Protein: 7.4 g/dL (ref 6.5–8.1)

## 2022-11-06 DIAGNOSIS — I129 Hypertensive chronic kidney disease with stage 1 through stage 4 chronic kidney disease, or unspecified chronic kidney disease: Secondary | ICD-10-CM | POA: Diagnosis not present

## 2022-11-06 DIAGNOSIS — Z23 Encounter for immunization: Secondary | ICD-10-CM | POA: Diagnosis not present

## 2022-11-06 DIAGNOSIS — Z0000 Encounter for general adult medical examination without abnormal findings: Secondary | ICD-10-CM | POA: Diagnosis not present

## 2022-11-06 DIAGNOSIS — E441 Mild protein-calorie malnutrition: Secondary | ICD-10-CM | POA: Diagnosis not present

## 2022-11-06 DIAGNOSIS — I10 Essential (primary) hypertension: Secondary | ICD-10-CM | POA: Diagnosis not present

## 2022-11-06 DIAGNOSIS — E1169 Type 2 diabetes mellitus with other specified complication: Secondary | ICD-10-CM | POA: Diagnosis not present

## 2022-11-06 DIAGNOSIS — E7800 Pure hypercholesterolemia, unspecified: Secondary | ICD-10-CM | POA: Diagnosis not present

## 2022-11-06 DIAGNOSIS — Z85038 Personal history of other malignant neoplasm of large intestine: Secondary | ICD-10-CM | POA: Diagnosis not present

## 2022-11-06 DIAGNOSIS — E222 Syndrome of inappropriate secretion of antidiuretic hormone: Secondary | ICD-10-CM | POA: Diagnosis not present

## 2022-11-06 DIAGNOSIS — R911 Solitary pulmonary nodule: Secondary | ICD-10-CM | POA: Diagnosis not present

## 2022-11-06 DIAGNOSIS — J439 Emphysema, unspecified: Secondary | ICD-10-CM | POA: Diagnosis not present

## 2022-11-06 DIAGNOSIS — Z1331 Encounter for screening for depression: Secondary | ICD-10-CM | POA: Diagnosis not present

## 2022-11-06 DIAGNOSIS — Z79899 Other long term (current) drug therapy: Secondary | ICD-10-CM | POA: Diagnosis not present

## 2022-11-06 DIAGNOSIS — I700 Atherosclerosis of aorta: Secondary | ICD-10-CM | POA: Diagnosis not present

## 2022-11-06 DIAGNOSIS — M810 Age-related osteoporosis without current pathological fracture: Secondary | ICD-10-CM | POA: Diagnosis not present

## 2022-11-09 ENCOUNTER — Inpatient Hospital Stay (HOSPITAL_BASED_OUTPATIENT_CLINIC_OR_DEPARTMENT_OTHER): Payer: Medicare Other | Admitting: Internal Medicine

## 2022-11-09 ENCOUNTER — Other Ambulatory Visit: Payer: Self-pay

## 2022-11-09 VITALS — BP 134/65 | HR 91 | Temp 98.0°F | Resp 16 | Wt 96.4 lb

## 2022-11-09 DIAGNOSIS — C3490 Malignant neoplasm of unspecified part of unspecified bronchus or lung: Secondary | ICD-10-CM

## 2022-11-09 DIAGNOSIS — R911 Solitary pulmonary nodule: Secondary | ICD-10-CM | POA: Diagnosis not present

## 2022-11-09 DIAGNOSIS — Z85818 Personal history of malignant neoplasm of other sites of lip, oral cavity, and pharynx: Secondary | ICD-10-CM | POA: Diagnosis not present

## 2022-11-09 DIAGNOSIS — Z85038 Personal history of other malignant neoplasm of large intestine: Secondary | ICD-10-CM | POA: Diagnosis not present

## 2022-11-09 DIAGNOSIS — N289 Disorder of kidney and ureter, unspecified: Secondary | ICD-10-CM | POA: Diagnosis not present

## 2022-11-09 DIAGNOSIS — Z923 Personal history of irradiation: Secondary | ICD-10-CM | POA: Diagnosis not present

## 2022-11-09 NOTE — Progress Notes (Signed)
[facility] Telephone:([phone]   Fax:([phone]  OFFICE PROGRESS NOTE  Courtney Aspen, MD [address], [address] [place] [address]  DIAGNOSIS:  Stage II poorly differentiated adenocarcinoma of the ascending colon (T3N0), status post a right colectomy 10/30/2016 MSI-high, loss of MLH1 and PMS2 MLH1 methylation detected Surveillance CT scans at [facility] on 02/02/2017-negative for recurrent colon cancer Surveillance colonoscopy 10/04/2017-patent end-to-side ileocolonic anastomosis; diverticulosis in the sigmoid colon; examination otherwise normal.  Next colonoscopy 3 years   2.   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  Presumed stage Ia non-small cell lung cancer with a nodule in the left upper lobe.   PRIOR THERAPY: Status post SBRT under the care of Dr. Mitzi Hansen.  The biopsy showed atypia but no confirmed malignancy.  CURRENT THERAPY: Observation.   INTERVAL HISTORY: Courtney Bowen 77 y.o. female returns to the clinic today for 6 months follow-up visit.  The patient is feeling fine today with no concerning complaints.  She denied having any current chest pain, shortness of breath, cough or hemoptysis.  She has no nausea, vomiting, diarrhea or constipation.  She has no headache or visual changes.  She has no recent weight loss or night sweats.  She is here today for evaluation and repeat CT scan of the chest for restaging of her disease.  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  MEDICATIONS:  Current Outpatient Medications  Medication Sig Dispense Refill   alendronate (FOSAMAX) 70 MG tablet Take 70 mg by mouth once a week.     amLODipine (NORVASC) 5 MG tablet Take 5 mg by mouth daily.     aspirin EC 81 MG tablet Take 81 mg by mouth daily.      atorvastatin (LIPITOR) 20 MG tablet Take 20 mg by mouth daily.     cholecalciferol (VITAMIN D) 400 UNITS TABS Take 400 Units by mouth daily.      docusate sodium (COLACE) 100 MG capsule Take 100 mg by mouth in the morning and at bedtime.     losartan (COZAAR) 50 MG tablet Take 50 mg by mouth every evening     nicotine polacrilex (NICORETTE) 4 MG gum Take 4 mg by mouth as needed for smoking cessation.      omeprazole (PRILOSEC) 20 MG capsule Take 20 mg by mouth daily.     polyethylene glycol (MIRALAX / GLYCOLAX) packet Take 17 g by mouth daily as needed.      No current facility-administered medications for this visit.    SURGICAL HISTORY:  Past Surgical History:  Procedure Laterality Date   BREAST BIOPSY     hx of  CHOLECYSTECTOMY  20 yrs ago   laproscopic   COLONOSCOPY     FUDUCIAL PLACEMENT N/A 07/19/2019   Procedure: PLACEMENT OF FUDUCIAL;  Surgeon: Delight Ovens, MD;  Location: [facility] OR;  Service: Thoracic;  Laterality: N/A;   LUNG BIOPSY Left 07/19/2019   Procedure: LUNG BIOPSY;  Surgeon: Delight Ovens, MD;  Location: [facility] OR;  Service: Thoracic;  Laterality: Left;   TONSILLECTOMY     VIDEO BRONCHOSCOPY WITH ENDOBRONCHIAL NAVIGATION N/A 07/19/2019   Procedure: VIDEO BRONCHOSCOPY WITH ENDOBRONCHIAL NAVIGATION with Biopsy, Fuducial placement;  Surgeon: Delight Ovens, MD;  Location: [facility] OR;  Service: Thoracic;  Laterality: N/A;   WISDOM TOOTH EXTRACTION      REVIEW OF  SYSTEMS:  Review of systems not obtained due to patient factors.   PHYSICAL EXAMINATION: General appearance: alert, cooperative, and no distress Head: Normocephalic, without obvious abnormality, atraumatic Neck: no adenopathy, no JVD, supple, symmetrical, trachea midline, and thyroid not enlarged, symmetric, no tenderness/mass/nodules Lymph nodes: Cervical, supraclavicular, and axillary nodes normal. Resp: clear to auscultation bilaterally Back: symmetric, no curvature. ROM normal. No CVA tenderness. Cardio: regular rate and rhythm, S1, S2 normal, no murmur, click, rub or gallop GI: soft, non-tender; bowel sounds normal; no masses,  no organomegaly Extremities: extremities normal, atraumatic, no cyanosis or edema  ECOG PERFORMANCE STATUS: 1 - Symptomatic but completely ambulatory  Blood pressure 134/65, pulse 91, temperature 98 F (36.7 C), temperature source Oral, resp. rate 16, weight 96 lb 7 oz (43.7 kg), SpO2 98%.  LABORATORY DATA: Lab Results  Component Value Date   WBC 5.3 11/05/2022   HGB 11.1 (L) 11/05/2022   HCT 30.9 (L) 11/05/2022   MCV 74.8 (L) 11/05/2022   PLT 263 11/05/2022      Chemistry      Component Value Date/Time   NA 131 (L) 11/05/2022 1525   K 4.5 11/05/2022 1525   CL 95 (L) 11/05/2022 1525   CO2 27 11/05/2022 1525   BUN 16 11/05/2022 1525   BUN 5.4 (L) 11/09/2012 1033   CREATININE 1.35 (H) 11/05/2022 1525   CREATININE 0.8 11/09/2012 1033      Component Value Date/Time   CALCIUM 9.9 11/05/2022 1525   CALCIUM 6.9 (L) 09/24/2010 0500   ALKPHOS 81 11/05/2022 1525   AST 15 11/05/2022 1525   ALT 12 11/05/2022 1525   BILITOT 0.3 11/05/2022 1525       RADIOGRAPHIC STUDIES: CT Chest Wo Contrast  Result Date: 11/09/2022 CLINICAL DATA:  Restaging non-small cell lung cancer. Patient also has a history of colon cancer status post right hemicolectomy. * Tracking Code: BO * EXAM: CT CHEST WITHOUT CONTRAST TECHNIQUE: Multidetector CT imaging of the chest was  performed following the standard protocol without IV contrast. RADIATION DOSE REDUCTION: This exam was performed according to the departmental dose-optimization program which includes automated exposure control, adjustment of the mA and/or kV according to patient size and/or use of iterative reconstruction technique. COMPARISON:  Multiple prior imaging studies. The most recent chest CT is 05/07/2022 FINDINGS: Cardiovascular: The heart is normal in size. No pericardial effusion. Stable tortuosity and calcification of the thoracic aorta and stable three-vessel coronary artery calcifications. Mediastinum/Nodes: No mediastinal or hilar mass or lymphadenopathy. Stable scattered lymph nodes. The esophagus is grossly normal. Lungs/Pleura: Stable advanced emphysematous changes and pulmonary scarring. Extensive radiation changes again noted in the lingula surrounding the fiducials. No progressive changes to suggest recurrent tumor. Stable patchy areas of subpleural scarring change in both lungs. Stable nodularity in the right middle  lobe. No new or worrisome progressive changes and no new pulmonary nodules. No pleural effusions or pleural lesions. Upper Abdomen: Stable advanced vascular disease and changes of chronic calcific pancreatitis. Musculoskeletal: No breast masses, supraclavicular or axillary adenopathy. The bony thorax is intact. No worrisome lytic or sclerotic bone lesions to suggest metastatic disease. IMPRESSION: 1. Stable radiation changes in the lingula surrounding the fiducials. No progressive changes to suggest recurrent tumor. 2. Extensive areas of subpleural scarring type changes in both lungs. Stable nodularity in the right middle lobe. No new or worrisome progressive changes and no new pulmonary nodules. Recommend continued surveillance. 3. No mediastinal or hilar mass or adenopathy. 4. Stable advanced vascular disease including three-vessel coronary artery calcifications. Aortic Atherosclerosis  (ICD10-I70.0) and Emphysema (ICD10-J43.9). Electronically Signed   By: Rudie Meyer M.D.   On: 11/09/2022 13:56    ASSESSMENT AND PLAN: This is a very pleasant 77 years old African-American female with the following medical problems. Stage II poorly differentiated adenocarcinoma of the ascending colon (T3N0), status post a right colectomy 10/30/2016 MSI-high, loss of MLH1 and PMS2 MLH1 methylation detected Surveillance CT scans at [facility] on 02/02/2017-negative for recurrent colon cancer Surveillance colonoscopy 10/04/2017-patent end-to-side ileocolonic anastomosis; diverticulosis in the sigmoid colon; examination otherwise normal.  Next colonoscopy scheduled to be done in April 2023.   2.   Right tonsil cancer (T2N0), status post primary radiation completed 09/27/2012  3.  Presumed stage Ia non-small cell lung cancer with a nodule in the left upper lobe status post SBRT under the care of Dr. Mitzi Hansen.  The biopsy showed atypia but no confirmed malignancy.   The patient is currently on observation and feeling fine with no concerning complaints. She had repeat CT scan of the chest performed recently.  I personally and independently reviewed the scan and discussed the result with the patient today. Her scan showed no concerning findings for disease recurrence or metastasis. I recommended for her to continue on observation with repeat CT scan of the chest without contrast in 6 months. 4.  For the renal insufficiency, She is followed by nephrology. The patient was advised to call immediately if she has any other concerning symptoms in the interval.  The patient voices understanding of current disease status and treatment options and is in agreement with the current care plan.  All questions were answered. The patient knows to call the clinic with any problems, questions or concerns. We can certainly see the patient much sooner if necessary.  Disclaimer: This note was dictated with voice recognition software.  Similar sounding words can inadvertently be transcribed and may not be corrected upon review.

## 2022-11-18 DIAGNOSIS — H25812 Combined forms of age-related cataract, left eye: Secondary | ICD-10-CM | POA: Diagnosis not present

## 2022-11-18 DIAGNOSIS — H21562 Pupillary abnormality, left eye: Secondary | ICD-10-CM | POA: Diagnosis not present

## 2022-11-18 DIAGNOSIS — H2512 Age-related nuclear cataract, left eye: Secondary | ICD-10-CM | POA: Diagnosis not present

## 2023-01-06 DIAGNOSIS — Z23 Encounter for immunization: Secondary | ICD-10-CM | POA: Diagnosis not present

## 2023-02-18 DIAGNOSIS — Z1231 Encounter for screening mammogram for malignant neoplasm of breast: Secondary | ICD-10-CM | POA: Diagnosis not present

## 2023-03-01 DIAGNOSIS — N1832 Chronic kidney disease, stage 3b: Secondary | ICD-10-CM | POA: Diagnosis not present

## 2023-03-08 DIAGNOSIS — E871 Hypo-osmolality and hyponatremia: Secondary | ICD-10-CM | POA: Diagnosis not present

## 2023-03-08 DIAGNOSIS — E1122 Type 2 diabetes mellitus with diabetic chronic kidney disease: Secondary | ICD-10-CM | POA: Diagnosis not present

## 2023-03-08 DIAGNOSIS — D509 Iron deficiency anemia, unspecified: Secondary | ICD-10-CM | POA: Diagnosis not present

## 2023-03-08 DIAGNOSIS — N1330 Unspecified hydronephrosis: Secondary | ICD-10-CM | POA: Diagnosis not present

## 2023-03-08 DIAGNOSIS — C3490 Malignant neoplasm of unspecified part of unspecified bronchus or lung: Secondary | ICD-10-CM | POA: Diagnosis not present

## 2023-03-08 DIAGNOSIS — E875 Hyperkalemia: Secondary | ICD-10-CM | POA: Diagnosis not present

## 2023-03-08 DIAGNOSIS — I129 Hypertensive chronic kidney disease with stage 1 through stage 4 chronic kidney disease, or unspecified chronic kidney disease: Secondary | ICD-10-CM | POA: Diagnosis not present

## 2023-03-08 DIAGNOSIS — N1831 Chronic kidney disease, stage 3a: Secondary | ICD-10-CM | POA: Diagnosis not present

## 2023-04-15 DIAGNOSIS — R350 Frequency of micturition: Secondary | ICD-10-CM | POA: Diagnosis not present

## 2023-05-06 ENCOUNTER — Inpatient Hospital Stay: Payer: Medicare Other | Attending: Internal Medicine

## 2023-05-06 ENCOUNTER — Ambulatory Visit (HOSPITAL_COMMUNITY)
Admission: RE | Admit: 2023-05-06 | Discharge: 2023-05-06 | Disposition: A | Discharge status: Home / Self Care | Payer: Medicare Other | Source: Ambulatory Visit | Attending: Internal Medicine | Admitting: Internal Medicine

## 2023-05-06 DIAGNOSIS — Z85818 Personal history of malignant neoplasm of other sites of lip, oral cavity, and pharynx: Secondary | ICD-10-CM | POA: Insufficient documentation

## 2023-05-06 DIAGNOSIS — Z8709 Personal history of other diseases of the respiratory system: Secondary | ICD-10-CM | POA: Diagnosis not present

## 2023-05-06 DIAGNOSIS — C3490 Malignant neoplasm of unspecified part of unspecified bronchus or lung: Secondary | ICD-10-CM | POA: Insufficient documentation

## 2023-05-06 DIAGNOSIS — Z9049 Acquired absence of other specified parts of digestive tract: Secondary | ICD-10-CM | POA: Insufficient documentation

## 2023-05-06 DIAGNOSIS — Z923 Personal history of irradiation: Secondary | ICD-10-CM | POA: Insufficient documentation

## 2023-05-06 DIAGNOSIS — I700 Atherosclerosis of aorta: Secondary | ICD-10-CM | POA: Diagnosis not present

## 2023-05-06 DIAGNOSIS — N289 Disorder of kidney and ureter, unspecified: Secondary | ICD-10-CM | POA: Insufficient documentation

## 2023-05-06 DIAGNOSIS — J439 Emphysema, unspecified: Secondary | ICD-10-CM | POA: Diagnosis not present

## 2023-05-06 DIAGNOSIS — I10 Essential (primary) hypertension: Secondary | ICD-10-CM | POA: Diagnosis not present

## 2023-05-06 DIAGNOSIS — K5909 Other constipation: Secondary | ICD-10-CM | POA: Diagnosis not present

## 2023-05-06 DIAGNOSIS — Z85038 Personal history of other malignant neoplasm of large intestine: Secondary | ICD-10-CM | POA: Insufficient documentation

## 2023-05-06 LAB — CMP (CANCER CENTER ONLY)
ALT: 12 U/L (ref 0–44)
AST: 16 U/L (ref 15–41)
Albumin: 4.8 g/dL (ref 3.5–5.0)
Alkaline Phosphatase: 81 U/L (ref 38–126)
Anion gap: 8 (ref 5–15)
BUN: 17 mg/dL (ref 8–23)
CO2: 30 mmol/L (ref 22–32)
Calcium: 10.4 mg/dL — ABNORMAL HIGH (ref 8.9–10.3)
Chloride: 97 mmol/L — ABNORMAL LOW (ref 98–111)
Creatinine: 1.42 mg/dL — ABNORMAL HIGH (ref 0.44–1.00)
GFR, Estimated: 38 mL/min — ABNORMAL LOW (ref >60)
Glucose, Bld: 149 mg/dL — ABNORMAL HIGH (ref 70–99)
Potassium: 4.6 mmol/L (ref 3.5–5.1)
Sodium: 135 mmol/L (ref 135–145)
Total Bilirubin: 0.4 mg/dL (ref 0.0–1.2)
Total Protein: 7.7 g/dL (ref 6.5–8.1)

## 2023-05-06 LAB — CBC WITH DIFFERENTIAL (CANCER CENTER ONLY)
Abs Immature Granulocytes: 0.02 10*3/uL (ref 0.00–0.07)
Basophils Absolute: 0 10*3/uL (ref 0.0–0.1)
Basophils Relative: 1 %
Eosinophils Absolute: 0.1 10*3/uL (ref 0.0–0.5)
Eosinophils Relative: 1 %
HCT: 33.7 % — ABNORMAL LOW (ref 36.0–46.0)
Hemoglobin: 11.8 g/dL — ABNORMAL LOW (ref 12.0–15.0)
Immature Granulocytes: 0 %
Lymphocytes Relative: 29 %
Lymphs Abs: 1.9 10*3/uL (ref 0.7–4.0)
MCH: 26.9 pg (ref 26.0–34.0)
MCHC: 35 g/dL (ref 30.0–36.0)
MCV: 76.9 fL — ABNORMAL LOW (ref 80.0–100.0)
Monocytes Absolute: 0.6 10*3/uL (ref 0.1–1.0)
Monocytes Relative: 9 %
Neutro Abs: 3.9 10*3/uL (ref 1.7–7.7)
Neutrophils Relative %: 60 %
Platelet Count: 265 10*3/uL (ref 150–400)
RBC: 4.38 MIL/uL (ref 3.87–5.11)
RDW: 13.7 % (ref 11.5–15.5)
WBC Count: 6.6 10*3/uL (ref 4.0–10.5)
nRBC: 0 % (ref 0.0–0.2)

## 2023-05-07 DIAGNOSIS — I129 Hypertensive chronic kidney disease with stage 1 through stage 4 chronic kidney disease, or unspecified chronic kidney disease: Secondary | ICD-10-CM | POA: Diagnosis not present

## 2023-05-07 DIAGNOSIS — E1169 Type 2 diabetes mellitus with other specified complication: Secondary | ICD-10-CM | POA: Diagnosis not present

## 2023-05-11 ENCOUNTER — Inpatient Hospital Stay (HOSPITAL_BASED_OUTPATIENT_CLINIC_OR_DEPARTMENT_OTHER): Payer: Medicare Other | Admitting: Internal Medicine

## 2023-05-11 VITALS — BP 148/71 | HR 94 | Temp 97.7°F | Resp 16 | Ht 65.0 in | Wt 104.6 lb

## 2023-05-11 DIAGNOSIS — I10 Essential (primary) hypertension: Secondary | ICD-10-CM | POA: Diagnosis not present

## 2023-05-11 DIAGNOSIS — N289 Disorder of kidney and ureter, unspecified: Secondary | ICD-10-CM | POA: Diagnosis not present

## 2023-05-11 DIAGNOSIS — C3490 Malignant neoplasm of unspecified part of unspecified bronchus or lung: Secondary | ICD-10-CM | POA: Diagnosis not present

## 2023-05-11 DIAGNOSIS — Z8709 Personal history of other diseases of the respiratory system: Secondary | ICD-10-CM | POA: Diagnosis not present

## 2023-05-11 DIAGNOSIS — K5909 Other constipation: Secondary | ICD-10-CM | POA: Diagnosis not present

## 2023-05-11 DIAGNOSIS — Z85818 Personal history of malignant neoplasm of other sites of lip, oral cavity, and pharynx: Secondary | ICD-10-CM | POA: Diagnosis not present

## 2023-05-11 NOTE — Progress Notes (Signed)
 [facility] Telephone:(336) [phone]   Fax:(336) [phone]  OFFICE PROGRESS NOTE  Charlott Dorn LABOR, MD [address] [place] [address]  DIAGNOSIS:  Stage II poorly differentiated adenocarcinoma of the ascending colon (T3N0), status post a right colectomy 10/30/2016 MSI-high, loss of MLH1 and PMS2 MLH1 methylation detected Surveillance CT scans at [facility] on 02/02/2017-negative for recurrent colon cancer Surveillance colonoscopy 10/04/2017-patent end-to-side ileocolonic anastomosis; diverticulosis in the sigmoid colon; examination otherwise normal.  Next colonoscopy 3 years   2.   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  Presumed stage Ia non-small cell lung cancer with a nodule in the left upper lobe.   PRIOR THERAPY: Status post SBRT under the care of Dr. Dewey.  The biopsy showed atypia but no confirmed malignancy.  CURRENT THERAPY: Observation.   INTERVAL HISTORY: Courtney Bowen 78 y.o. female returns to the clinic today for 83-month follow-up visit.Discussed the use of AI scribe software for clinical note transcription with the patient, who gave verbal consent to proceed.  History of Present Illness   The patient, a 78 year old with a complex oncological history, presents for a routine follow-up visit. She was initially diagnosed with right tonsil cancer in June 2014, followed by stage II colon cancer in July 2018, and most recently, stage IA non-small cell lung cancer in 2021, for which she received radiation therapy.  Over the past six months, the patient reports no new complaints or significant changes in her health status. She denies experiencing chest pain, shortness of breath, cough, or changes in bowel movements. She does, however, have a longstanding history of constipation dating back to childhood, which is managed with stool softeners.  [name] weight has increased by approximately eight  pounds since her last visit. She has no reported weight loss. Her blood pressure was noted to be slightly elevated during the visit, which she attributes to anxiety related to medical appointments.  The patient continues to exhibit radiation changes in the left lung.       MEDICAL HISTORY: Past Medical History:  Diagnosis Date   Cancer (HCC) 07/08/12   right tonsil   Cataract    B/L   Colon cancer (HCC) dx'd 2016   COPD (chronic obstructive pulmonary disease) (HCC)    Diabetes mellitus without complication (HCC)    GERD (gastroesophageal reflux disease)    Gout    Hypercholesterolemia    Hypertension    not on any meds   Lung nodule    Pneumonia    Renal insufficiency    history of   Squamous cell carcinoma of tonsil (HCC) 07/08/12   right   Status post radiation therapy within last four weeks 08/17/11-10/07/12   squamous cell of tonsil   Submandibular sialolithiasis    right   Wears glasses     ALLERGIES:  is allergic to amoxicillin and erythromycin.  MEDICATIONS:  Current Outpatient Medications  Medication Sig Dispense Refill   alendronate (FOSAMAX) 70 MG tablet Take 70 mg by mouth once a week.     amLODipine (NORVASC) 5 MG tablet Take 5 mg by mouth daily.     aspirin  EC 81 MG tablet Take 81 mg by mouth daily.      atorvastatin  (LIPITOR) 20 MG tablet Take 20 mg by mouth daily.     cholecalciferol (VITAMIN D ) 400 UNITS TABS Take 400 Units by mouth daily.      docusate sodium (COLACE) 100 MG capsule  Take 100 mg by mouth in the morning and at bedtime.     losartan (COZAAR) 50 MG tablet Take 50 mg by mouth every evening     nicotine polacrilex (NICORETTE) 4 MG gum Take 4 mg by mouth as needed for smoking cessation.      omeprazole (PRILOSEC) 20 MG capsule Take 20 mg by mouth daily.     polyethylene glycol (MIRALAX  / GLYCOLAX ) packet Take 17 g by mouth daily as needed.      No current facility-administered medications for this visit.    SURGICAL HISTORY:  Past Surgical  History:  Procedure Laterality Date   BREAST BIOPSY     hx of   CHOLECYSTECTOMY  20 yrs ago   laproscopic   COLONOSCOPY     FUDUCIAL PLACEMENT N/A 07/19/2019   Procedure: PLACEMENT OF FUDUCIAL;  Surgeon: Army Dallas NOVAK, MD;  Location: [facility] OR;  Service: Thoracic;  Laterality: N/A;   LUNG BIOPSY Left 07/19/2019   Procedure: LUNG BIOPSY;  Surgeon: Army Dallas NOVAK, MD;  Location: [facility] OR;  Service: Thoracic;  Laterality: Left;   TONSILLECTOMY     VIDEO BRONCHOSCOPY WITH ENDOBRONCHIAL NAVIGATION N/A 07/19/2019   Procedure: VIDEO BRONCHOSCOPY WITH ENDOBRONCHIAL NAVIGATION with Biopsy, Fuducial placement;  Surgeon: Army Dallas NOVAK, MD;  Location: [facility] OR;  Service: Thoracic;  Laterality: N/A;   WISDOM TOOTH EXTRACTION      REVIEW OF SYSTEMS:  A comprehensive review of systems was negative.   PHYSICAL EXAMINATION: General appearance: alert, cooperative, and no distress Head: Normocephalic, without obvious abnormality, atraumatic Neck: no adenopathy, no JVD, supple, symmetrical, trachea midline, and thyroid  not enlarged, symmetric, no tenderness/mass/nodules Lymph nodes: Cervical, supraclavicular, and axillary nodes normal. Resp: clear to auscultation bilaterally Back: symmetric, no curvature. ROM normal. No CVA tenderness. Cardio: regular rate and rhythm, S1, S2 normal, no murmur, click, rub or gallop GI: soft, non-tender; bowel sounds normal; no masses,  no organomegaly Extremities: extremities normal, atraumatic, no cyanosis or edema  ECOG PERFORMANCE STATUS: 1 - Symptomatic but completely ambulatory  Blood pressure (!) 148/71, pulse 94, temperature 97.7 F (36.5 C), temperature source Temporal, resp. rate 16, height 5' 5 (1.651 m), weight 104 lb 9.6 oz (47.4 kg), SpO2 100%.  LABORATORY DATA: Lab Results  Component Value Date   WBC 6.6 05/06/2023   HGB 11.8 (L) 05/06/2023   HCT 33.7 (L) 05/06/2023   MCV 76.9 (L) 05/06/2023   PLT 265 05/06/2023      Chemistry      Component  Value Date/Time   NA 135 05/06/2023 0953   K 4.6 05/06/2023 0953   CL 97 (L) 05/06/2023 0953   CO2 30 05/06/2023 0953   BUN 17 05/06/2023 0953   BUN 5.4 (L) 11/09/2012 1033   CREATININE 1.42 (H) 05/06/2023 0953   CREATININE 0.8 11/09/2012 1033      Component Value Date/Time   CALCIUM  10.4 (H) 05/06/2023 0953   CALCIUM  6.9 (L) 09/24/2010 0500   ALKPHOS 81 05/06/2023 0953   AST 16 05/06/2023 0953   ALT 12 05/06/2023 0953   BILITOT 0.4 05/06/2023 0953       RADIOGRAPHIC STUDIES: CT Chest Wo Contrast Result Date: 05/11/2023 CLINICAL DATA:  Non-small cell lung cancer, for restaging EXAM: CT CHEST WITHOUT CONTRAST TECHNIQUE: Multidetector CT imaging of the chest was performed following the standard protocol without IV contrast. RADIATION DOSE REDUCTION: This exam was performed according to the departmental dose-optimization program which includes automated exposure control, adjustment of the mA and/or kV according to  patient size and/or use of iterative reconstruction technique. COMPARISON:  11/05/2022 FINDINGS: Cardiovascular: The heart is normal in size. Mild leftward cardiomediastinal shift. No evidence of thoracic aortic aneurysm. Atherosclerotic calcifications of the aortic arch. Mild) of the LAD. Mediastinum/Nodes: No suspicious mediastinal lymphadenopathy. Visualized thyroid  is unremarkable. Lungs/Pleura: Biapical pleural-parenchymal scarring. Moderate centrilobular and paraseptal emphysematous changes, upper lung predominant. Fiducial marker with radiation changes in the lingula (series 5/image 90). Mild linear scarring/atelectasis in the left lower lobe. 11 mm irregular nodule in the right upper lobe (series 5/image 27), unchanged. Additional scattered subpleural scarring/nodularity lungs bilaterally, including in the anterior right upper lobe (series 5/image 52). No focal consolidation. No pleural effusion or pneumothorax. Upper Abdomen: Visualized upper abdomen is grossly unremarkable,  noting vascular calcifications in sequela of chronic pancreatitis. Musculoskeletal: Radiation changes/pathologic fracture involving the left lateral 5th and 6th ribs, chronic. Thoracic spine is within normal limits. IMPRESSION: Radiation changes in the lingula. No findings specific for recurrent or metastatic disease. Stable bilateral lung nodularity, as described above. Continued attention on follow-up is suggested. Aortic Atherosclerosis (ICD10-I70.0) and Emphysema (ICD10-J43.9). Electronically Signed   By: Pinkie Pebbles M.D.   On: 05/11/2023 00:15    ASSESSMENT AND PLAN: This is a very pleasant 78 years old African-American female with the following medical problems. Stage II poorly differentiated adenocarcinoma of the ascending colon (T3N0), status post a right colectomy 10/30/2016 MSI-high, loss of MLH1 and PMS2 MLH1 methylation detected Surveillance CT [facility] at [facility] on 02/02/2017-negative for recurrent colon cancer Surveillance colonoscopy 10/04/2017-patent end-to-side ileocolonic anastomosis; diverticulosis in the sigmoid colon; examination otherwise normal.  Next colonoscopy scheduled to be done in April 2023.   2.   Right tonsil cancer (T2N0), status post primary radiation completed 09/27/2012  3.  Presumed stage Ia non-small cell lung cancer with a nodule in the left upper lobe status post SBRT under the care of Dr. Dewey.  The biopsy showed atypia but no confirmed malignancy.   She has been on observation and doing well.  She had repeat CT scan of the chest that showed no concerning findings for disease recurrence or metastasis.    Stage IA Non-Small Cell Lung Cancer Diagnosed in 2021. Received radiation therapy. Current scan shows radiation changes in the left lung but no evidence of recurrent or metastatic disease. Patient expressed fear about recurrence, reassured that follow-up is routine and no current concerns. - Continue routine follow-up and observation - Review scan results with  patient  Stage II Colon Cancer Diagnosed in July 2018. No current complaints or evidence of recurrence. - Continue routine follow-up and observation  Right Tonsil Cancer Diagnosed in June 2014. No current complaints or evidence of recurrence. - Continue routine follow-up and observation  Hypertension Elevated blood pressure noted during visit, likely due to anxiety. No immediate concerns. - Monitor blood pressure - Reassess during next visit  Chronic Constipation Long-standing history since childhood. Managed with stool softeners. - Continue current management with stool softeners  General Health Maintenance Gained 8 pounds since last visit. No new complaints. - Continue routine health maintenance and follow-up  Follow-up - Schedule follow-up appointment in six months.     For the renal insufficiency, She is followed by nephrology. The patient was advised to call immediately if she has any other concerning symptoms in the interval.  The patient voices understanding of current disease status and treatment options and is in agreement with the current care plan.  All questions were answered. The patient knows to call the clinic with any problems,  questions or concerns. We can certainly see the patient much sooner if necessary.  Disclaimer: This note was dictated with voice recognition software. Similar sounding words can inadvertently be transcribed and may not be corrected upon review.

## 2023-05-12 DIAGNOSIS — Z23 Encounter for immunization: Secondary | ICD-10-CM | POA: Diagnosis not present

## 2023-05-12 DIAGNOSIS — E1169 Type 2 diabetes mellitus with other specified complication: Secondary | ICD-10-CM | POA: Diagnosis not present

## 2023-05-12 DIAGNOSIS — I10 Essential (primary) hypertension: Secondary | ICD-10-CM | POA: Diagnosis not present

## 2023-07-02 DIAGNOSIS — H40013 Open angle with borderline findings, low risk, bilateral: Secondary | ICD-10-CM | POA: Diagnosis not present

## 2023-07-15 DIAGNOSIS — J439 Emphysema, unspecified: Secondary | ICD-10-CM | POA: Diagnosis not present

## 2023-07-15 DIAGNOSIS — I700 Atherosclerosis of aorta: Secondary | ICD-10-CM | POA: Diagnosis not present

## 2023-07-15 DIAGNOSIS — E1169 Type 2 diabetes mellitus with other specified complication: Secondary | ICD-10-CM | POA: Diagnosis not present

## 2023-07-15 DIAGNOSIS — I10 Essential (primary) hypertension: Secondary | ICD-10-CM | POA: Diagnosis not present

## 2023-07-26 DIAGNOSIS — I10 Essential (primary) hypertension: Secondary | ICD-10-CM | POA: Diagnosis not present

## 2023-07-26 DIAGNOSIS — J439 Emphysema, unspecified: Secondary | ICD-10-CM | POA: Diagnosis not present

## 2023-07-26 DIAGNOSIS — I700 Atherosclerosis of aorta: Secondary | ICD-10-CM | POA: Diagnosis not present

## 2023-07-26 DIAGNOSIS — E1169 Type 2 diabetes mellitus with other specified complication: Secondary | ICD-10-CM | POA: Diagnosis not present

## 2023-07-30 DIAGNOSIS — H40023 Open angle with borderline findings, high risk, bilateral: Secondary | ICD-10-CM | POA: Diagnosis not present

## 2023-08-13 DIAGNOSIS — J439 Emphysema, unspecified: Secondary | ICD-10-CM | POA: Diagnosis not present

## 2023-08-13 DIAGNOSIS — E1169 Type 2 diabetes mellitus with other specified complication: Secondary | ICD-10-CM | POA: Diagnosis not present

## 2023-08-13 DIAGNOSIS — I10 Essential (primary) hypertension: Secondary | ICD-10-CM | POA: Diagnosis not present

## 2023-08-13 DIAGNOSIS — I700 Atherosclerosis of aorta: Secondary | ICD-10-CM | POA: Diagnosis not present

## 2023-08-25 DIAGNOSIS — I700 Atherosclerosis of aorta: Secondary | ICD-10-CM | POA: Diagnosis not present

## 2023-08-25 DIAGNOSIS — I129 Hypertensive chronic kidney disease with stage 1 through stage 4 chronic kidney disease, or unspecified chronic kidney disease: Secondary | ICD-10-CM | POA: Diagnosis not present

## 2023-08-25 DIAGNOSIS — M810 Age-related osteoporosis without current pathological fracture: Secondary | ICD-10-CM | POA: Diagnosis not present

## 2023-08-25 DIAGNOSIS — I10 Essential (primary) hypertension: Secondary | ICD-10-CM | POA: Diagnosis not present

## 2023-08-25 DIAGNOSIS — J439 Emphysema, unspecified: Secondary | ICD-10-CM | POA: Diagnosis not present

## 2023-08-25 DIAGNOSIS — E1169 Type 2 diabetes mellitus with other specified complication: Secondary | ICD-10-CM | POA: Diagnosis not present

## 2023-08-31 DIAGNOSIS — N1832 Chronic kidney disease, stage 3b: Secondary | ICD-10-CM | POA: Diagnosis not present

## 2023-09-08 DIAGNOSIS — E875 Hyperkalemia: Secondary | ICD-10-CM | POA: Diagnosis not present

## 2023-09-08 DIAGNOSIS — N1831 Chronic kidney disease, stage 3a: Secondary | ICD-10-CM | POA: Diagnosis not present

## 2023-09-08 DIAGNOSIS — D509 Iron deficiency anemia, unspecified: Secondary | ICD-10-CM | POA: Diagnosis not present

## 2023-09-08 DIAGNOSIS — I129 Hypertensive chronic kidney disease with stage 1 through stage 4 chronic kidney disease, or unspecified chronic kidney disease: Secondary | ICD-10-CM | POA: Diagnosis not present

## 2023-09-08 DIAGNOSIS — E1122 Type 2 diabetes mellitus with diabetic chronic kidney disease: Secondary | ICD-10-CM | POA: Diagnosis not present

## 2023-09-08 DIAGNOSIS — E871 Hypo-osmolality and hyponatremia: Secondary | ICD-10-CM | POA: Diagnosis not present

## 2023-09-14 DIAGNOSIS — I700 Atherosclerosis of aorta: Secondary | ICD-10-CM | POA: Diagnosis not present

## 2023-09-14 DIAGNOSIS — I10 Essential (primary) hypertension: Secondary | ICD-10-CM | POA: Diagnosis not present

## 2023-09-14 DIAGNOSIS — E1169 Type 2 diabetes mellitus with other specified complication: Secondary | ICD-10-CM | POA: Diagnosis not present

## 2023-09-14 DIAGNOSIS — J439 Emphysema, unspecified: Secondary | ICD-10-CM | POA: Diagnosis not present

## 2023-09-25 DIAGNOSIS — M810 Age-related osteoporosis without current pathological fracture: Secondary | ICD-10-CM | POA: Diagnosis not present

## 2023-09-25 DIAGNOSIS — I10 Essential (primary) hypertension: Secondary | ICD-10-CM | POA: Diagnosis not present

## 2023-09-25 DIAGNOSIS — I129 Hypertensive chronic kidney disease with stage 1 through stage 4 chronic kidney disease, or unspecified chronic kidney disease: Secondary | ICD-10-CM | POA: Diagnosis not present

## 2023-09-25 DIAGNOSIS — E1169 Type 2 diabetes mellitus with other specified complication: Secondary | ICD-10-CM | POA: Diagnosis not present

## 2023-09-25 DIAGNOSIS — J439 Emphysema, unspecified: Secondary | ICD-10-CM | POA: Diagnosis not present

## 2023-09-25 DIAGNOSIS — I700 Atherosclerosis of aorta: Secondary | ICD-10-CM | POA: Diagnosis not present

## 2023-10-14 DIAGNOSIS — J439 Emphysema, unspecified: Secondary | ICD-10-CM | POA: Diagnosis not present

## 2023-10-14 DIAGNOSIS — I700 Atherosclerosis of aorta: Secondary | ICD-10-CM | POA: Diagnosis not present

## 2023-10-14 DIAGNOSIS — I10 Essential (primary) hypertension: Secondary | ICD-10-CM | POA: Diagnosis not present

## 2023-10-14 DIAGNOSIS — E1169 Type 2 diabetes mellitus with other specified complication: Secondary | ICD-10-CM | POA: Diagnosis not present

## 2023-10-25 DIAGNOSIS — J439 Emphysema, unspecified: Secondary | ICD-10-CM | POA: Diagnosis not present

## 2023-10-25 DIAGNOSIS — E1169 Type 2 diabetes mellitus with other specified complication: Secondary | ICD-10-CM | POA: Diagnosis not present

## 2023-10-25 DIAGNOSIS — M810 Age-related osteoporosis without current pathological fracture: Secondary | ICD-10-CM | POA: Diagnosis not present

## 2023-10-25 DIAGNOSIS — I10 Essential (primary) hypertension: Secondary | ICD-10-CM | POA: Diagnosis not present

## 2023-10-25 DIAGNOSIS — I129 Hypertensive chronic kidney disease with stage 1 through stage 4 chronic kidney disease, or unspecified chronic kidney disease: Secondary | ICD-10-CM | POA: Diagnosis not present

## 2023-10-25 DIAGNOSIS — I700 Atherosclerosis of aorta: Secondary | ICD-10-CM | POA: Diagnosis not present

## 2023-11-01 ENCOUNTER — Inpatient Hospital Stay: Payer: Medicare Other | Attending: Internal Medicine

## 2023-11-01 ENCOUNTER — Ambulatory Visit (HOSPITAL_COMMUNITY)
Admission: RE | Admit: 2023-11-01 | Discharge: 2023-11-01 | Disposition: A | Discharge status: Home / Self Care | Source: Ambulatory Visit | Attending: Internal Medicine | Admitting: Internal Medicine

## 2023-11-01 ENCOUNTER — Encounter (HOSPITAL_COMMUNITY): Payer: Self-pay

## 2023-11-01 DIAGNOSIS — K5909 Other constipation: Secondary | ICD-10-CM | POA: Insufficient documentation

## 2023-11-01 DIAGNOSIS — J432 Centrilobular emphysema: Secondary | ICD-10-CM | POA: Diagnosis not present

## 2023-11-01 DIAGNOSIS — Z9049 Acquired absence of other specified parts of digestive tract: Secondary | ICD-10-CM | POA: Insufficient documentation

## 2023-11-01 DIAGNOSIS — Z85038 Personal history of other malignant neoplasm of large intestine: Secondary | ICD-10-CM | POA: Insufficient documentation

## 2023-11-01 DIAGNOSIS — R590 Localized enlarged lymph nodes: Secondary | ICD-10-CM | POA: Diagnosis not present

## 2023-11-01 DIAGNOSIS — R911 Solitary pulmonary nodule: Secondary | ICD-10-CM | POA: Insufficient documentation

## 2023-11-01 DIAGNOSIS — Z923 Personal history of irradiation: Secondary | ICD-10-CM | POA: Diagnosis not present

## 2023-11-01 DIAGNOSIS — Z85818 Personal history of malignant neoplasm of other sites of lip, oral cavity, and pharynx: Secondary | ICD-10-CM | POA: Diagnosis not present

## 2023-11-01 DIAGNOSIS — C3490 Malignant neoplasm of unspecified part of unspecified bronchus or lung: Secondary | ICD-10-CM | POA: Insufficient documentation

## 2023-11-01 DIAGNOSIS — N289 Disorder of kidney and ureter, unspecified: Secondary | ICD-10-CM | POA: Insufficient documentation

## 2023-11-01 DIAGNOSIS — I700 Atherosclerosis of aorta: Secondary | ICD-10-CM | POA: Diagnosis not present

## 2023-11-01 LAB — CMP (CANCER CENTER ONLY)
ALT: 12 U/L (ref 0–44)
AST: 14 U/L — ABNORMAL LOW (ref 15–41)
Albumin: 4.5 g/dL (ref 3.5–5.0)
Alkaline Phosphatase: 79 U/L (ref 38–126)
Anion gap: 8 (ref 5–15)
BUN: 17 mg/dL (ref 8–23)
CO2: 29 mmol/L (ref 22–32)
Calcium: 10.2 mg/dL (ref 8.9–10.3)
Chloride: 96 mmol/L — ABNORMAL LOW (ref 98–111)
Creatinine: 1.36 mg/dL — ABNORMAL HIGH (ref 0.44–1.00)
GFR, Estimated: 40 mL/min — ABNORMAL LOW (ref >60)
Glucose, Bld: 112 mg/dL — ABNORMAL HIGH (ref 70–99)
Potassium: 4.7 mmol/L (ref 3.5–5.1)
Sodium: 133 mmol/L — ABNORMAL LOW (ref 135–145)
Total Bilirubin: 0.4 mg/dL (ref 0.0–1.2)
Total Protein: 7.4 g/dL (ref 6.5–8.1)

## 2023-11-01 LAB — CBC WITH DIFFERENTIAL (CANCER CENTER ONLY)
Abs Immature Granulocytes: 0.02 K/uL (ref 0.00–0.07)
Basophils Absolute: 0 K/uL (ref 0.0–0.1)
Basophils Relative: 1 %
Eosinophils Absolute: 0.1 K/uL (ref 0.0–0.5)
Eosinophils Relative: 2 %
HCT: 32.4 % — ABNORMAL LOW (ref 36.0–46.0)
Hemoglobin: 11.5 g/dL — ABNORMAL LOW (ref 12.0–15.0)
Immature Granulocytes: 0 %
Lymphocytes Relative: 36 %
Lymphs Abs: 2 K/uL (ref 0.7–4.0)
MCH: 26.7 pg (ref 26.0–34.0)
MCHC: 35.5 g/dL (ref 30.0–36.0)
MCV: 75.3 fL — ABNORMAL LOW (ref 80.0–100.0)
Monocytes Absolute: 0.7 K/uL (ref 0.1–1.0)
Monocytes Relative: 12 %
Neutro Abs: 2.7 K/uL (ref 1.7–7.7)
Neutrophils Relative %: 49 %
Platelet Count: 277 K/uL (ref 150–400)
RBC: 4.3 MIL/uL (ref 3.87–5.11)
RDW: 14 % (ref 11.5–15.5)
WBC Count: 5.6 K/uL (ref 4.0–10.5)
nRBC: 0 % (ref 0.0–0.2)

## 2023-11-08 ENCOUNTER — Inpatient Hospital Stay (HOSPITAL_BASED_OUTPATIENT_CLINIC_OR_DEPARTMENT_OTHER): Payer: Medicare Other | Admitting: Internal Medicine

## 2023-11-08 VITALS — BP 146/69 | HR 85 | Temp 97.7°F | Resp 16 | Wt 110.0 lb

## 2023-11-08 DIAGNOSIS — K5909 Other constipation: Secondary | ICD-10-CM | POA: Diagnosis not present

## 2023-11-08 DIAGNOSIS — N289 Disorder of kidney and ureter, unspecified: Secondary | ICD-10-CM | POA: Diagnosis not present

## 2023-11-08 DIAGNOSIS — Z85818 Personal history of malignant neoplasm of other sites of lip, oral cavity, and pharynx: Secondary | ICD-10-CM | POA: Diagnosis not present

## 2023-11-08 DIAGNOSIS — C3490 Malignant neoplasm of unspecified part of unspecified bronchus or lung: Secondary | ICD-10-CM

## 2023-11-08 DIAGNOSIS — R911 Solitary pulmonary nodule: Secondary | ICD-10-CM | POA: Diagnosis not present

## 2023-11-08 DIAGNOSIS — Z85038 Personal history of other malignant neoplasm of large intestine: Secondary | ICD-10-CM | POA: Diagnosis not present

## 2023-11-08 NOTE — Progress Notes (Signed)
 [facility] Telephone:([phone]) [phone]   Fax:([phone]) [phone]  OFFICE PROGRESS NOTE  Charlott Dorn LABOR, MD 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  Next colonoscopy 3 years   2.   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  Presumed stage Ia non-small cell lung cancer with a nodule in the left upper lobe.   PRIOR THERAPY: Status post SBRT under the care of Dr. Dewey.  The biopsy showed atypia but no confirmed malignancy.  CURRENT THERAPY: Observation.   INTERVAL HISTORY: Courtney Bowen 78 y.o. female returns to the clinic today for 6-month follow-up visit.Discussed the use of AI scribe software for clinical note transcription with the patient, who gave verbal consent to proceed.  History of Present Illness   Courtney Bowen is a 78 year old female with a history of adenocarcinoma of the colon and lung cancer who presents for evaluation and repeat CT scan of the chest for her lung nodule.  She has a history of adenocarcinoma of the colon and stage 1A non-small cell lung cancer in the left upper lobe. She underwent stereotactic body radiation therapy (SBRT) and is currently under observation.  No new symptoms such as chest pain, shortness of breath, coughing, or hemoptysis have been noted since her last visit six months ago. She continues to experience chronic constipation, which she manages with a stool softener.  A recent CT scan of the chest was performed.        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  MEDICATIONS:  Current Outpatient Medications  Medication Sig Dispense Refill   alendronate (FOSAMAX) 70 MG tablet Take 70 mg by mouth once a week.     amLODipine (NORVASC) 5 MG tablet Take 5 mg by mouth daily.     aspirin  EC 81 MG tablet Take 81 mg by mouth daily.      atorvastatin  (LIPITOR) 20 MG tablet Take 20 mg by mouth daily.     cholecalciferol (VITAMIN D ) 400 UNITS TABS Take 400 Units by mouth daily.      docusate sodium (COLACE) 100 MG capsule Take 100 mg by mouth in the morning and at bedtime.     losartan (COZAAR) 50 MG tablet Take 50 mg by mouth every evening     nicotine polacrilex (NICORETTE) 4 MG gum Take 4 mg  by mouth as needed for smoking cessation.      omeprazole (PRILOSEC) 20 MG capsule Take 20 mg by mouth daily.     polyethylene glycol (MIRALAX  / GLYCOLAX ) packet Take 17 g by mouth daily as needed.      No current facility-administered medications for this visit.    SURGICAL HISTORY:  Past Surgical History:  Procedure Laterality Date   BREAST BIOPSY     hx of   CHOLECYSTECTOMY  20 yrs ago   laproscopic   COLONOSCOPY     FUDUCIAL PLACEMENT N/A 07/19/2019   Procedure: PLACEMENT OF FUDUCIAL;  Surgeon: Army Dallas NOVAK, MD;  Location: [facility] OR;  Service: Thoracic;  Laterality: N/A;   LUNG BIOPSY Left 07/19/2019    Procedure: LUNG BIOPSY;  Surgeon: Army Dallas NOVAK, MD;  Location: [facility] OR;  Service: Thoracic;  Laterality: Left;   TONSILLECTOMY     VIDEO BRONCHOSCOPY WITH ENDOBRONCHIAL NAVIGATION N/A 07/19/2019   Procedure: VIDEO BRONCHOSCOPY WITH ENDOBRONCHIAL NAVIGATION with Biopsy, Fuducial placement;  Surgeon: Army Dallas NOVAK, MD;  Location: [facility] OR;  Service: Thoracic;  Laterality: N/A;   WISDOM TOOTH EXTRACTION      REVIEW OF SYSTEMS:  A comprehensive review of systems was negative except for: Gastrointestinal: positive for constipation   PHYSICAL EXAMINATION: General appearance: alert, cooperative, and no distress Head: Normocephalic, without obvious abnormality, atraumatic Neck: no adenopathy, no JVD, supple, symmetrical, trachea midline, and thyroid  not enlarged, symmetric, no tenderness/mass/nodules Lymph nodes: Cervical, supraclavicular, and axillary nodes normal. Resp: clear to auscultation bilaterally Back: symmetric, no curvature. ROM normal. No CVA tenderness. Cardio: regular rate and rhythm, S1, S2 normal, no murmur, click, rub or gallop GI: soft, non-tender; bowel sounds normal; no masses,  no organomegaly Extremities: extremities normal, atraumatic, no cyanosis or edema  ECOG PERFORMANCE STATUS: 1 - Symptomatic but completely ambulatory  Blood pressure (!) 146/69, pulse 85, temperature 97.7 F (36.5 C), temperature source Temporal, resp. rate 16, weight 110 lb (49.9 kg), SpO2 100%.  LABORATORY DATA: Lab Results  Component Value Date   WBC 5.6 11/01/2023   HGB 11.5 (L) 11/01/2023   HCT 32.4 (L) 11/01/2023   MCV 75.3 (L) 11/01/2023   PLT 277 11/01/2023      Chemistry      Component Value Date/Time   NA 133 (L) 11/01/2023 1000   K 4.7 11/01/2023 1000   CL 96 (L) 11/01/2023 1000   CO2 29 11/01/2023 1000   BUN 17 11/01/2023 1000   BUN 5.4 (L) 11/09/2012 1033   CREATININE 1.36 (H) 11/01/2023 1000   CREATININE 0.8 11/09/2012 1033      Component Value Date/Time    CALCIUM  10.2 11/01/2023 1000   CALCIUM  6.9 (L) 09/24/2010 0500   ALKPHOS 79 11/01/2023 1000   AST 14 (L) 11/01/2023 1000   ALT 12 11/01/2023 1000   BILITOT 0.4 11/01/2023 1000       RADIOGRAPHIC STUDIES: CT Chest Wo Contrast Result Date: 11/02/2023 CLINICAL DATA:  Non-small cell lung cancer, restaging. * Tracking Code: BO * EXAM: CT CHEST WITHOUT CONTRAST TECHNIQUE: Multidetector CT imaging of the chest was performed following the standard protocol without IV contrast. RADIATION DOSE REDUCTION: This exam was performed according to the departmental dose-optimization program which includes automated exposure control, adjustment of the mA and/or kV according to patient size and/or use of iterative reconstruction technique. COMPARISON:  Multiple priors including CT May 06, 2023 FINDINGS: Cardiovascular: Aortic atherosclerosis. Normal size heart. Coronary artery calcifications. Mediastinum/Nodes: No suspicious thyroid  nodule. Stable prominent/mildly enlarged precarinal  lymph node measuring 14 mm in short axis on image 73/2. Gas fluid levels and a patulous esophagus. Lungs/Pleura: Similar post radiation change in the lingula without new suspicious nodularity in the treatment bed. Irregular pulmonary nodule in the right upper lobe measures 10 mm on image 29/7 previously 11 mm. Additional scattered subpleural scarring/nodularity in the bilateral lungs is stable from prior examination. Moderate centrilobular and paraseptal emphysema. Biapical pleuroparenchymal scarring. Upper Abdomen: Sequela chronic pancreatitis. Musculoskeletal: No aggressive lytic or blastic lesion of bone. Radiation change/pathologic fracture of the lateral fifth and sixth ribs, chronic. IMPRESSION: 1. Similar post radiation change in the lingula without new suspicious nodularity in the treatment bed. 2. Irregular pulmonary nodule in the right upper lobe measures 10 mm, previously 11 mm. 3. Stable prominent/mildly enlarged precarinal lymph  node measuring 14 mm in short axis. 4. Gas fluid levels and a patulous esophagus, suggestive of gastroesophageal reflux. Aortic Atherosclerosis (ICD10-I70.0) and Emphysema (ICD10-J43.9). Electronically Signed   By: Reyes Holder M.D.   On: 11/02/2023 16:23    ASSESSMENT AND PLAN: This is a very pleasant 78 years old African-American female with the following medical problems. Stage II poorly differentiated adenocarcinoma of the ascending colon (T3N0), status post a right colectomy 10/30/2016 MSI-high, loss of MLH1 and PMS2 MLH1 methylation detected Surveillance CT scans at [facility] on 02/02/2017-negative for recurrent colon cancer Surveillance colonoscopy 10/04/2017-patent end-to-side ileocolonic anastomosis; diverticulosis in the sigmoid colon; examination otherwise normal.  Next colonoscopy scheduled to be done in April 2023.   2.   Right tonsil cancer (T2N0), status post primary radiation completed 09/27/2012  3.  Presumed stage Ia non-small cell lung cancer with a nodule in the left upper lobe status post SBRT under the care of Dr. Dewey.  The biopsy showed atypia but no confirmed malignancy.   She is currently on observation and feeling fine.  She had repeat CT scan of the chest performed recently.  I personally independently reviewed the scan and discussed results with the patient today.  PET scan showed no concerning findings for disease recurrence or metastasis. Assessment and Plan    Stage 1A non-small cell lung cancer, left upper lobe Status post stereotactic body radiation therapy (SBRT) with no new symptoms such as chest pain, shortness of breath, or hemoptysis. Recent CT scan shows the lung nodule is slightly smaller, measuring 10 mm compared to 11 mm previously, indicating stability and no evidence of progression. She prefers more frequent follow-up due to past cancer history but agrees to annual scans to minimize radiation exposure. - Continue observation with annual CT scan of the chest  unless new symptoms arise. - Advise to report any new or concerning symptoms immediately.  Colon adenocarcinoma  Chronic constipation Managed with stool softeners with no new changes in bowel habits reported. - Continue current management with stool softeners as needed.   For the renal insufficiency, She is followed by nephrology. The patient was advised to call immediately if she has any concerning symptoms in the interval.  The patient voices understanding of current disease status and treatment options and is in agreement with the current care plan.  All questions were answered. The patient knows to call the clinic with any problems, questions or concerns. We can certainly see the patient much sooner if necessary.  Disclaimer: This note was dictated with voice recognition software. Similar sounding words can inadvertently be transcribed and may not be corrected upon review.

## 2023-11-10 ENCOUNTER — Telehealth: Payer: Self-pay | Admitting: Internal Medicine

## 2023-11-10 NOTE — Telephone Encounter (Signed)
 Left the patient a voicemail with the scheduled appointment details around a Ct scan expected date.

## 2023-11-12 DIAGNOSIS — E1169 Type 2 diabetes mellitus with other specified complication: Secondary | ICD-10-CM | POA: Diagnosis not present

## 2023-11-12 DIAGNOSIS — I129 Hypertensive chronic kidney disease with stage 1 through stage 4 chronic kidney disease, or unspecified chronic kidney disease: Secondary | ICD-10-CM | POA: Diagnosis not present

## 2023-11-12 DIAGNOSIS — D649 Anemia, unspecified: Secondary | ICD-10-CM | POA: Diagnosis not present

## 2023-11-12 DIAGNOSIS — M810 Age-related osteoporosis without current pathological fracture: Secondary | ICD-10-CM | POA: Diagnosis not present

## 2023-11-12 DIAGNOSIS — E7800 Pure hypercholesterolemia, unspecified: Secondary | ICD-10-CM | POA: Diagnosis not present

## 2023-11-12 DIAGNOSIS — Z0000 Encounter for general adult medical examination without abnormal findings: Secondary | ICD-10-CM | POA: Diagnosis not present

## 2023-11-12 DIAGNOSIS — E441 Mild protein-calorie malnutrition: Secondary | ICD-10-CM | POA: Diagnosis not present

## 2023-11-13 DIAGNOSIS — E1169 Type 2 diabetes mellitus with other specified complication: Secondary | ICD-10-CM | POA: Diagnosis not present

## 2023-11-13 DIAGNOSIS — I10 Essential (primary) hypertension: Secondary | ICD-10-CM | POA: Diagnosis not present

## 2023-11-13 DIAGNOSIS — I700 Atherosclerosis of aorta: Secondary | ICD-10-CM | POA: Diagnosis not present

## 2023-11-13 DIAGNOSIS — J439 Emphysema, unspecified: Secondary | ICD-10-CM | POA: Diagnosis not present

## 2023-11-15 DIAGNOSIS — J439 Emphysema, unspecified: Secondary | ICD-10-CM | POA: Diagnosis not present

## 2023-11-15 DIAGNOSIS — G4700 Insomnia, unspecified: Secondary | ICD-10-CM | POA: Diagnosis not present

## 2023-11-15 DIAGNOSIS — E1169 Type 2 diabetes mellitus with other specified complication: Secondary | ICD-10-CM | POA: Diagnosis not present

## 2023-11-15 DIAGNOSIS — E7800 Pure hypercholesterolemia, unspecified: Secondary | ICD-10-CM | POA: Diagnosis not present

## 2023-11-15 DIAGNOSIS — E222 Syndrome of inappropriate secretion of antidiuretic hormone: Secondary | ICD-10-CM | POA: Diagnosis not present

## 2023-11-15 DIAGNOSIS — N1832 Chronic kidney disease, stage 3b: Secondary | ICD-10-CM | POA: Diagnosis not present

## 2023-11-15 DIAGNOSIS — Z1331 Encounter for screening for depression: Secondary | ICD-10-CM | POA: Diagnosis not present

## 2023-11-15 DIAGNOSIS — Z0000 Encounter for general adult medical examination without abnormal findings: Secondary | ICD-10-CM | POA: Diagnosis not present

## 2023-11-15 DIAGNOSIS — M810 Age-related osteoporosis without current pathological fracture: Secondary | ICD-10-CM | POA: Diagnosis not present

## 2023-11-15 DIAGNOSIS — Z23 Encounter for immunization: Secondary | ICD-10-CM | POA: Diagnosis not present

## 2023-11-15 DIAGNOSIS — I10 Essential (primary) hypertension: Secondary | ICD-10-CM | POA: Diagnosis not present

## 2023-11-15 DIAGNOSIS — R911 Solitary pulmonary nodule: Secondary | ICD-10-CM | POA: Diagnosis not present

## 2023-11-15 DIAGNOSIS — Z79899 Other long term (current) drug therapy: Secondary | ICD-10-CM | POA: Diagnosis not present

## 2023-11-15 DIAGNOSIS — E441 Mild protein-calorie malnutrition: Secondary | ICD-10-CM | POA: Diagnosis not present

## 2023-11-15 DIAGNOSIS — Z85038 Personal history of other malignant neoplasm of large intestine: Secondary | ICD-10-CM | POA: Diagnosis not present

## 2023-11-25 DIAGNOSIS — M810 Age-related osteoporosis without current pathological fracture: Secondary | ICD-10-CM | POA: Diagnosis not present

## 2023-11-25 DIAGNOSIS — I10 Essential (primary) hypertension: Secondary | ICD-10-CM | POA: Diagnosis not present

## 2023-11-25 DIAGNOSIS — I129 Hypertensive chronic kidney disease with stage 1 through stage 4 chronic kidney disease, or unspecified chronic kidney disease: Secondary | ICD-10-CM | POA: Diagnosis not present

## 2023-11-25 DIAGNOSIS — J439 Emphysema, unspecified: Secondary | ICD-10-CM | POA: Diagnosis not present

## 2023-11-25 DIAGNOSIS — I700 Atherosclerosis of aorta: Secondary | ICD-10-CM | POA: Diagnosis not present

## 2023-11-25 DIAGNOSIS — E1169 Type 2 diabetes mellitus with other specified complication: Secondary | ICD-10-CM | POA: Diagnosis not present

## 2023-11-29 DIAGNOSIS — H401131 Primary open-angle glaucoma, bilateral, mild stage: Secondary | ICD-10-CM | POA: Diagnosis not present

## 2023-12-13 DIAGNOSIS — I10 Essential (primary) hypertension: Secondary | ICD-10-CM | POA: Diagnosis not present

## 2023-12-13 DIAGNOSIS — I700 Atherosclerosis of aorta: Secondary | ICD-10-CM | POA: Diagnosis not present

## 2023-12-13 DIAGNOSIS — J439 Emphysema, unspecified: Secondary | ICD-10-CM | POA: Diagnosis not present

## 2023-12-13 DIAGNOSIS — E1169 Type 2 diabetes mellitus with other specified complication: Secondary | ICD-10-CM | POA: Diagnosis not present

## 2023-12-26 DIAGNOSIS — I700 Atherosclerosis of aorta: Secondary | ICD-10-CM | POA: Diagnosis not present

## 2023-12-26 DIAGNOSIS — I129 Hypertensive chronic kidney disease with stage 1 through stage 4 chronic kidney disease, or unspecified chronic kidney disease: Secondary | ICD-10-CM | POA: Diagnosis not present

## 2023-12-26 DIAGNOSIS — M810 Age-related osteoporosis without current pathological fracture: Secondary | ICD-10-CM | POA: Diagnosis not present

## 2023-12-26 DIAGNOSIS — I10 Essential (primary) hypertension: Secondary | ICD-10-CM | POA: Diagnosis not present

## 2023-12-26 DIAGNOSIS — E1169 Type 2 diabetes mellitus with other specified complication: Secondary | ICD-10-CM | POA: Diagnosis not present

## 2023-12-26 DIAGNOSIS — J439 Emphysema, unspecified: Secondary | ICD-10-CM | POA: Diagnosis not present

## 2024-01-03 DIAGNOSIS — R221 Localized swelling, mass and lump, neck: Secondary | ICD-10-CM | POA: Diagnosis not present

## 2024-01-11 DIAGNOSIS — H401131 Primary open-angle glaucoma, bilateral, mild stage: Secondary | ICD-10-CM | POA: Diagnosis not present

## 2024-01-11 DIAGNOSIS — Z961 Presence of intraocular lens: Secondary | ICD-10-CM | POA: Diagnosis not present

## 2024-01-12 DIAGNOSIS — I10 Essential (primary) hypertension: Secondary | ICD-10-CM | POA: Diagnosis not present

## 2024-01-12 DIAGNOSIS — J439 Emphysema, unspecified: Secondary | ICD-10-CM | POA: Diagnosis not present

## 2024-01-12 DIAGNOSIS — I700 Atherosclerosis of aorta: Secondary | ICD-10-CM | POA: Diagnosis not present

## 2024-01-12 DIAGNOSIS — E1169 Type 2 diabetes mellitus with other specified complication: Secondary | ICD-10-CM | POA: Diagnosis not present

## 2024-01-13 ENCOUNTER — Encounter: Payer: Self-pay | Admitting: Internal Medicine

## 2024-01-19 DIAGNOSIS — Z23 Encounter for immunization: Secondary | ICD-10-CM | POA: Diagnosis not present

## 2024-01-25 DIAGNOSIS — M810 Age-related osteoporosis without current pathological fracture: Secondary | ICD-10-CM | POA: Diagnosis not present

## 2024-01-25 DIAGNOSIS — I129 Hypertensive chronic kidney disease with stage 1 through stage 4 chronic kidney disease, or unspecified chronic kidney disease: Secondary | ICD-10-CM | POA: Diagnosis not present

## 2024-01-25 DIAGNOSIS — I10 Essential (primary) hypertension: Secondary | ICD-10-CM | POA: Diagnosis not present

## 2024-01-25 DIAGNOSIS — J439 Emphysema, unspecified: Secondary | ICD-10-CM | POA: Diagnosis not present

## 2024-01-25 DIAGNOSIS — I700 Atherosclerosis of aorta: Secondary | ICD-10-CM | POA: Diagnosis not present

## 2024-01-25 DIAGNOSIS — E1169 Type 2 diabetes mellitus with other specified complication: Secondary | ICD-10-CM | POA: Diagnosis not present

## 2024-02-11 DIAGNOSIS — I700 Atherosclerosis of aorta: Secondary | ICD-10-CM | POA: Diagnosis not present

## 2024-02-11 DIAGNOSIS — E1169 Type 2 diabetes mellitus with other specified complication: Secondary | ICD-10-CM | POA: Diagnosis not present

## 2024-02-11 DIAGNOSIS — J439 Emphysema, unspecified: Secondary | ICD-10-CM | POA: Diagnosis not present

## 2024-02-11 DIAGNOSIS — I10 Essential (primary) hypertension: Secondary | ICD-10-CM | POA: Diagnosis not present

## 2024-02-24 DIAGNOSIS — Z1231 Encounter for screening mammogram for malignant neoplasm of breast: Secondary | ICD-10-CM | POA: Diagnosis not present

## 2024-02-25 DIAGNOSIS — E1169 Type 2 diabetes mellitus with other specified complication: Secondary | ICD-10-CM | POA: Diagnosis not present

## 2024-02-25 DIAGNOSIS — I10 Essential (primary) hypertension: Secondary | ICD-10-CM | POA: Diagnosis not present

## 2024-02-25 DIAGNOSIS — J439 Emphysema, unspecified: Secondary | ICD-10-CM | POA: Diagnosis not present

## 2024-02-25 DIAGNOSIS — I700 Atherosclerosis of aorta: Secondary | ICD-10-CM | POA: Diagnosis not present

## 2024-02-25 DIAGNOSIS — I129 Hypertensive chronic kidney disease with stage 1 through stage 4 chronic kidney disease, or unspecified chronic kidney disease: Secondary | ICD-10-CM | POA: Diagnosis not present

## 2024-02-25 DIAGNOSIS — M810 Age-related osteoporosis without current pathological fracture: Secondary | ICD-10-CM | POA: Diagnosis not present

## 2024-03-02 DIAGNOSIS — R922 Inconclusive mammogram: Secondary | ICD-10-CM | POA: Diagnosis not present

## 2024-03-12 DIAGNOSIS — I10 Essential (primary) hypertension: Secondary | ICD-10-CM | POA: Diagnosis not present

## 2024-03-12 DIAGNOSIS — E1169 Type 2 diabetes mellitus with other specified complication: Secondary | ICD-10-CM | POA: Diagnosis not present

## 2024-03-12 DIAGNOSIS — I700 Atherosclerosis of aorta: Secondary | ICD-10-CM | POA: Diagnosis not present

## 2024-03-12 DIAGNOSIS — J439 Emphysema, unspecified: Secondary | ICD-10-CM | POA: Diagnosis not present

## 2024-03-26 DIAGNOSIS — I700 Atherosclerosis of aorta: Secondary | ICD-10-CM | POA: Diagnosis not present

## 2024-03-26 DIAGNOSIS — E1169 Type 2 diabetes mellitus with other specified complication: Secondary | ICD-10-CM | POA: Diagnosis not present

## 2024-03-26 DIAGNOSIS — I10 Essential (primary) hypertension: Secondary | ICD-10-CM | POA: Diagnosis not present

## 2024-03-26 DIAGNOSIS — J439 Emphysema, unspecified: Secondary | ICD-10-CM | POA: Diagnosis not present

## 2024-03-26 DIAGNOSIS — M810 Age-related osteoporosis without current pathological fracture: Secondary | ICD-10-CM | POA: Diagnosis not present

## 2024-03-26 DIAGNOSIS — I129 Hypertensive chronic kidney disease with stage 1 through stage 4 chronic kidney disease, or unspecified chronic kidney disease: Secondary | ICD-10-CM | POA: Diagnosis not present

## 2024-05-13 ENCOUNTER — Other Ambulatory Visit (HOSPITAL_BASED_OUTPATIENT_CLINIC_OR_DEPARTMENT_OTHER): Payer: Self-pay | Admitting: Internal Medicine

## 2024-05-13 DIAGNOSIS — M810 Age-related osteoporosis without current pathological fracture: Secondary | ICD-10-CM

## 2024-10-30 ENCOUNTER — Other Ambulatory Visit

## 2024-10-30 ENCOUNTER — Other Ambulatory Visit (HOSPITAL_COMMUNITY)

## 2024-11-06 ENCOUNTER — Ambulatory Visit: Admitting: Internal Medicine
